# Patient Record
Sex: Male | Born: 1945 | Race: White | Hispanic: No | Marital: Married
Health system: Midwestern US, Academic
[De-identification: ages and names within clinical notes are randomized; demographics above are authoritative.]

## PROBLEM LIST (undated history)

## (undated) DIAGNOSIS — Z9989 Dependence on other enabling machines and devices: Secondary | ICD-10-CM

## (undated) DIAGNOSIS — G629 Polyneuropathy, unspecified: Secondary | ICD-10-CM

## (undated) DIAGNOSIS — M069 Rheumatoid arthritis, unspecified: Secondary | ICD-10-CM

## (undated) DIAGNOSIS — N289 Disorder of kidney and ureter, unspecified: Secondary | ICD-10-CM

## (undated) DIAGNOSIS — K219 Gastro-esophageal reflux disease without esophagitis: Secondary | ICD-10-CM

## (undated) DIAGNOSIS — G4733 Obstructive sleep apnea (adult) (pediatric): Secondary | ICD-10-CM

## (undated) DIAGNOSIS — N189 Chronic kidney disease, unspecified: Secondary | ICD-10-CM

## (undated) DIAGNOSIS — E119 Type 2 diabetes mellitus without complications: Secondary | ICD-10-CM

## (undated) DIAGNOSIS — G4730 Sleep apnea, unspecified: Secondary | ICD-10-CM

## (undated) DIAGNOSIS — E875 Hyperkalemia: Secondary | ICD-10-CM

## (undated) HISTORY — PX: CHOLECYSTECTOMY: SHX55

## (undated) HISTORY — PX: TONSILLECTOMY: SUR1361

## (undated) HISTORY — PX: OTHER SURGICAL HISTORY: SHX169

## (undated) HISTORY — PX: BELOW KNEE LEG AMPUTATION: SUR23

---

## 2005-03-23 NOTE — Unmapped (Signed)
Signed by Lynnell Dike MD on 03/23/2005 at 00:00:00  DXA      Imported By: Maryellen Pile 05/20/2006 09:54:27    _____________________________________________________________________    External Attachment:    Please see [facility] EMR for this document.

## 2010-07-07 NOTE — Unmapped (Signed)
Signed by Melodye Ped MD on 07/10/2010 at 08:46:32    [facility]  ___________________________________________________________________________  Electromyography    Referring Physician:   Carolann Littler, MD  Fax Number:  [phone]  Date of Exam:  07/07/10      NERVE CONDUCTION STUDY (skin temp. 34B)          MOTOR                                AMP.                          LAT.            COND. VEL.       F-WAVE         COMMENTS                                                           (mV)                          (msec)              (m/sec)                (msec)         Stim. L. Median N/R       Stim. L. Ulnar-W 5.8 (>6.5) 3.0 (<3.4)      (>53) 37.8 (<33)                          -BE 4.4  42                         -AE 3.8  42  Stim. R. Median 0.8 (>3.9) 9.5 (<4.5) 39 (>48)   Stim. R. Ulnar-W 9.9 (>6.5) 3.0 (<3.4)      (>53) 36.1 (<33)                          -BE 9.5  47                         -AE 8.8  47                                                           SENSORY                               AMP.                           LAT.           COND. VEL.                                                            (  uV)                           (msec)              (m/sec)    L. Med. Palmar N/R   L. Ulnar Palmar N/R   L. Radial Nerve N/R     (normal values in parentheses)      Indication for Referral: The patient was evaluated for carpal tunnel syndrome and left cervical radiculopathy. He has a long-standing history of type 1 diabetes mellitus.     Summary:   The nerve conduction studies showed an absent left median compound muscle action potential and palmar sensory nerve action potential. The right median motor distal latency was markedly prolonged and the amplitude severely reduced. The left ulnar motor amplitude was low with a conduction block at the elbow by amplitude criteria. All compound muscle action potentials tested had mildly to moderately reduced conduction velocities. All  sensory nerve action potentials tested were absent. The needle exam showed enlarged motor unit potentials with moderate fibrillation in both opponens pollicis muscles as well as the left first dorsal interosseous. Enlarged motor unit potentials without fibrillation were seen in the right first dorsal interosseous, left deltoid and left biceps muscles. The rest of the needle exam was normal. A new, presterilized needle electrode was used and then discarded.     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       Gerome Apley Luellen Pucker, M.D., Diplomate, American Board of Electrodiagnostic Medicine  LAS/md T: 07/09/10    Proofed electronically by Dr. Luellen Pucker         NEEDLE ELECTRODE EXAMINATION         Muscle Insertional   Activity Fibrillation Fasciculation Normal Recruitment Duration Amplitude Turns Comments   L 1st dorsal interos +  ++  +   ???  ++  ++  +     L flx carpi radialis nl 0 0 nl        L triceps nl 0 0 nl        L biceps nl 0 0   +  +      L deltoid nl 0 0   +  +  +     L opponens poll +  ++  0   ++  ++  +     L low cervical psp nl 0 0 - -                    R 1st dorsal interos nl 0 +    ++  +  ++     R opponens poll +  ++  0  ???  ++  ++  ++

## 2012-12-18 NOTE — Unmapped (Signed)
Pt is not our pt as far as i can see

## 2014-01-25 NOTE — Unmapped (Signed)
Pt had b/w done at [facility] on Friday he needs a refill of arava sent to [facility] [phone]

## 2014-01-27 MED ORDER — leflunomide (ARAVA) 20 MG tablet
20 | ORAL_TABLET | Freq: Every day | ORAL | Status: AC
Start: 2014-01-27 — End: 2014-05-05

## 2014-01-27 NOTE — Unmapped (Signed)
done

## 2014-01-27 NOTE — Unmapped (Signed)
RX CALLED IN

## 2014-03-01 MED ORDER — hydroxychloroquine (PLAQUENIL) 200 mg tablet
200 | ORAL_TABLET | Freq: Two times a day (BID) | ORAL | Status: AC
Start: 2014-03-01 — End: 2014-03-08

## 2014-03-08 NOTE — Unmapped (Signed)
Dr. Marcille Blanco,    Can you please refuse this medication, it was already sent in on 03/01/14 to correct pharmacy.    Thank you.

## 2014-03-09 MED ORDER — hydroxychloroquine (PLAQUENIL) 200 mg tablet
200 | ORAL_TABLET | ORAL | Status: AC
Start: 2014-03-09 — End: 2015-07-13

## 2014-03-11 ENCOUNTER — Inpatient Hospital Stay: Admit: 2014-03-11

## 2014-03-11 DIAGNOSIS — Z79899 Other long term (current) drug therapy: Secondary | ICD-10-CM

## 2014-03-11 LAB — CBC
Hematocrit: 39 % (ref 38.5–50.0)
Hemoglobin: 13.1 g/dL (ref 13.2–17.1)
MCH: 30.1 pg (ref 27.0–33.0)
MCHC: 33.6 g/dL (ref 32.0–36.0)
MCV: 89.7 fL (ref 80.0–100.0)
MPV: 7 fL (ref 7.5–11.5)
Platelets: 253 10*3/uL (ref 140–400)
RBC: 4.35 10*6/uL (ref 4.20–5.80)
RDW: 15.1 % (ref 11.0–15.0)
WBC: 8.2 10*3/uL (ref 3.8–10.8)

## 2014-03-11 LAB — HEPATIC FUNCTION PANEL, SERUM
ALT: 15 U/L (ref 7–52)
AST (SGOT): 28 U/L (ref 13–39)
Albumin: 3.9 g/dL (ref 3.5–5.7)
Alkaline Phosphatase: 98 U/L (ref 34–104)
Bilirubin, Direct: 0.12 mg/dL (ref 0.03–0.18)
Bilirubin, Indirect: 0.38 mg/dL (ref 0.20–0.70)
Total Bilirubin: 0.5 mg/dL (ref 0.3–1.2)
Total Protein: 7.2 g/dL (ref 6.4–8.9)

## 2014-03-11 LAB — CREATININE, SERUM
Creatinine: 1.41 mg/dL (ref 0.60–1.30)
GFR MDRD Af Amer: 60 See note.
GFR MDRD Non Af Amer: 50 See note.

## 2014-03-11 LAB — SED RATE: Sed Rate: 45 mm/h — ABNORMAL HIGH (ref 0–20)

## 2014-05-03 ENCOUNTER — Other Ambulatory Visit: Admit: 2014-05-03 | Payer: Medicare (Managed Care)

## 2014-05-03 DIAGNOSIS — Z79899 Other long term (current) drug therapy: Secondary | ICD-10-CM

## 2014-05-03 LAB — SED RATE: Sed Rate: 44 mm/h — ABNORMAL HIGH (ref 0–20)

## 2014-05-03 LAB — CBC
Hematocrit: 41.6 % (ref 38.5–50.0)
Hemoglobin: 13.6 g/dL (ref 13.2–17.1)
MCH: 29.1 pg (ref 27.0–33.0)
MCHC: 32.6 g/dL (ref 32.0–36.0)
MCV: 89.2 fL (ref 80.0–100.0)
MPV: 7.6 fL (ref 7.5–11.5)
Platelets: 206 10E3/uL (ref 140–400)
RBC: 4.66 10E6/uL (ref 4.20–5.80)
RDW: 15.7 % — ABNORMAL HIGH (ref 11.0–15.0)
WBC: 5.9 10E3/uL (ref 3.8–10.8)

## 2014-05-03 LAB — CREATININE, SERUM
Creatinine: 1.53 mg/dL — ABNORMAL HIGH (ref 0.60–1.30)
GFR MDRD Af Amer: 55 See note.
GFR MDRD Non Af Amer: 45 See note.

## 2014-05-03 LAB — HEPATIC FUNCTION PANEL, SERUM
ALT: 15 U/L (ref 7–52)
AST (SGOT): 21 U/L (ref 13–39)
Albumin: 3.9 g/dL (ref 3.5–5.7)
Alkaline Phosphatase: 101 U/L (ref 36–125)
Bilirubin, Direct: 0.16 mg/dL (ref 0.00–0.40)
Bilirubin, Indirect: 0.64 mg/dL (ref 0.00–1.10)
Total Bilirubin: 0.8 mg/dL (ref 0.0–1.5)
Total Protein: 7 g/dL (ref 6.4–8.9)

## 2014-05-05 MED ORDER — leflunomide (ARAVA) 20 MG tablet
20 | ORAL_TABLET | Freq: Every day | ORAL | Status: AC
Start: 2014-05-05 — End: 2014-07-05

## 2014-05-05 NOTE — Unmapped (Signed)
MOST CURRENT LAB( MEDICAL ASSISTANT SHOULD CHECK MEDICAL RECORDS OFFICE, [facility] EP[facility] AND CARE EVERYWHERE FOR LABS AND THEN NOTE WHERE THE LABS ARE LOCATED AND ANY ABNORMALITIES HERE IN THIS MESSAGE):       Specimen on 05/03/2014   Component Date Value Ref Range Status   ??? WBC 05/03/2014 5.9  3.8 - 10.8 10E3/uL Final   ??? RBC 05/03/2014 4.66  4.20 - 5.80 10E6/uL Final   ??? Hemoglobin 05/03/2014 13.6  13.2 - 17.1 g/dL Final   ??? Hematocrit 05/03/2014 41.6  38.5 - 50.0 % Final   ??? MCV 05/03/2014 89.2  80.0 - 100.0 fL Final   ??? MCH 05/03/2014 29.1  27.0 - 33.0 pg Final   ??? MCHC 05/03/2014 32.6  32.0 - 36.0 g/dL Final   ??? RDW 09/81/1914 15.7* 11.0 - 15.0 % Final   ??? Platelets 05/03/2014 206  140 - 400 10E3/uL Final   ??? MPV 05/03/2014 7.6  7.5 - 11.5 fL Final   ??? Total Bilirubin 05/03/2014 0.8  0.0 - 1.5 mg/dL Final    Please note: Effective 03/30/2014, reference range for this assay has changed.   ??? Bilirubin, Direct 05/03/2014 0.16  0.00 - 0.40 mg/dL Final    Please note: Effective 03/30/2014, reference range for this assay has changed.   ??? AST (SGOT) 05/03/2014 21  13 - 39 U/L Final   ??? ALT 05/03/2014 15  7 - 52 U/L Final   ??? Alkaline Phosphatase 05/03/2014 101  36 - 125 U/L Final    Please note: Effective 03/30/2014, reference range for this assay has changed.   ??? Total Protein 05/03/2014 7.0  6.4 - 8.9 g/dL Final   ??? Albumin 78/29/5621 3.9  3.5 - 5.7 g/dL Final   ??? Bilirubin, Indirect 05/03/2014 0.64  0.00 - 1.10 mg/dL Final    Please note: Effective 03/30/2014, reference range for this assay has changed.   ??? Creatinine 05/03/2014 1.53* 0.60 - 1.30 mg/dL Final   ??? GFR MDRD Af Amer 05/03/2014 55   Final    Comment: GFR is estimated using creatinine, age, gender and race. Patient's values should be interpreted as a trend.                               Between 30 and 90 mL/min/1.9m2, clinical correlation is needed.                              For additional information:   [email or web address]                              ??? GFR MDRD  Non Af Amer 05/03/2014 45   Final    Comment: GFR is estimated using creatinine, age, gender and race. Patient's values should be interpreted as a trend.                               Between 30 and 90 mL/min/1.85m2, clinical correlation is needed.                              For additional information:   [email or web address]                              ???  Sed Rate 05/03/2014 44* 0 - 20 mm/hr Final   Hospital Outpatient Visit on 03/11/2014   Component Date Value Ref Range Status   ??? WBC 03/11/2014 8.2  3.8 - 10.8 10E3/uL Final   ??? RBC 03/11/2014 4.35  4.20 - 5.80 10E6/uL Final   ??? Hemoglobin 03/11/2014 13.1* 13.2 - 17.1 g/dL Final   ??? Hematocrit 03/11/2014 39.0  38.5 - 50.0 % Final   ??? MCV 03/11/2014 89.7  80.0 - 100.0 fL Final   ??? MCH 03/11/2014 30.1  27.0 - 33.0 pg Final   ??? MCHC 03/11/2014 33.6  32.0 - 36.0 g/dL Final   ??? RDW 16/08/9603 15.1* 11.0 - 15.0 % Final   ??? Platelets 03/11/2014 253  140 - 400 10E3/uL Final   ??? MPV 03/11/2014 7.0* 7.5 - 11.5 fL Final   ??? Total Bilirubin 03/11/2014 0.5  0.3 - 1.2 mg/dL Final   ??? Bilirubin, Direct 03/11/2014 0.12  0.03 - 0.18 mg/dL Final   ??? AST (SGOT) 03/11/2014 28  13 - 39 U/L Final   ??? ALT 03/11/2014 15  7 - 52 U/L Final   ??? Alkaline Phosphatase 03/11/2014 98  34 - 104 U/L Final   ??? Total Protein 03/11/2014 7.2  6.4 - 8.9 g/dL Final   ??? Albumin 54/07/8118 3.9  3.5 - 5.7 g/dL Final   ??? Bilirubin, Indirect 03/11/2014 0.38  0.20 - 0.70 mg/dL Final   ??? Creatinine 03/11/2014 1.41* 0.60 - 1.30 mg/dL Final   ??? GFR MDRD Af Amer 03/11/2014 60   Final    Comment: GFR is estimated using creatinine, age, gender and race. Patient's values should be interpreted as a trend.                               Between 30 and 90 mL/min/1.105m2, clinical correlation is needed.                              For additional information:   [email or web address]                              ??? GFR MDRD Non Af Amer 03/11/2014 50   Final    Comment: GFR is estimated using creatinine, age, gender and race. Patient's  values should be interpreted as a trend.                               Between 30 and 90 mL/min/1.70m2, clinical correlation is needed.                              For additional information:   [email or web address]                              ??? Sed Rate 03/11/2014 45* 0 - 20 mm/hr Final                 LAST APPT:               ALLERGIES:       No Known Allergies    (MEDICAL ASSISTANT SHOULD CHECK [facility] EPIC AND DOCUMENT IN ALLERGY SECTION  AND MARK AS REVIEWED)

## 2014-05-11 NOTE — Unmapped (Signed)
Progress Notes       Lovell Sheehan., MD 11/11/2013 12:15 PM Signed   RHEUMATOLOGY FOLLOW UP VISIT   11/11/2013   Patient Name: Kenneth Hensley DOB: 21-Oct-1946 MEDICAL RECORD [id number]   PROBLEM LIST      Patient Active Problem List      Diagnosis      ???  Diabetes mellitus      ???  Rheumatoid arthritis        HISTORY OF PRESENT ILLNESS   Kenneth Hensley is a 68 y.o. male who is being seen for follow up evaluation of Rheumatoid arthritis. He's recently been noticing some increasing soreness and stiffness mainly in through his hands. He continues on a rave 20 mg daily. He also takes hydroxychloroquine 2 tabs daily     REVIEW OF SYSTEMS fever, decreased appetite, weight loss, depression, trouble sleeping, eye dryness or irritation, hair loss, mouth dryness, mouth soreness, skin rash, dark or black bowel movements, abdominal discomfort, nausea, swelling of ankles or feet, cough, chest pain, shortness of breath, problems with unusual bleeding or bruising, numbness or tingling, blood in urine     PAST MEDICAL HISTORY   MEDICATIONS      Outpatient Prescriptions Marked as Taking for the 11/11/13 encounter (Office Visit) with Lovell Sheehan., MD      Medication      ???  leflunomide (ARAVA) 20 mg tablet      ???  folic acid (FOLVITE) 1 mg tablet      ???  hydroxychloroquine (PLAQUENIL) 200 mg tablet        ALLERGIES   No Known Allergies     PHYSICAL EXAM   Physical Exam:   Blood pressure 88/48, height 5' 9 (1.753 m), weight 205 lb (92.987 kg). Body mass index is 30.26 kg/(m^2).   Musculoskeletal: Ambulates without assistance, normal gait, No definite swelling hands wrists or elbows     LABS AND IMAGING      Lab Results      Component  Value  Date       WBC  7.2  09/25/2013       HGB  14.6  09/25/2013       HCT  42.7  09/25/2013       MCV  89.3  09/25/2013       PLT  222  09/25/2013           Lab Results      Component  Value  Date       CREATININE  1.36*  09/25/2013           Lab Results      Component  Value  Date       BILITOT  0.9   09/25/2013       BILIDIR  0.4  09/25/2013       AST  35  09/25/2013       ALT  26  09/25/2013       ALKPHOS  130  09/25/2013       ALB  4.0  09/25/2013           Lab Results      Component  Value  Date       RF  201*  10/15/2012       SEDRATE  24*  09/25/2013            ASSESSMENT AND PLAN   1. History suggests that rheumatoid arthritis is active but I cannot detect this.  We discussed therapeutic options. He could be attempt sulfasalazine. However, currently feels his symptoms are minimal and will not require this   1. The patient indicates understanding of these issues and agrees with the plan.   2. I reviewed the patient's medical information and medical history.   3. I have reviewed the past medical, family, and social history sections including the medications and allergies listed in the above medical record.   Electronically signed by: Carlisle Beers. Marcille Blanco, MD, MD, 11/11/2013 12:13 PM

## 2014-05-12 NOTE — Unmapped (Signed)
RHEUMATOLOGY FOLLOW UP VISIT    05/12/2014  Patient Name: EBRIMA RANTA  DOB: [birth year]  MEDICAL RECORD [id number]    PROBLEM LIST     NOTES      HISTORY OF PRESENT ILLNESS  ZAKARIYAH FREIMARK is a 68 y.o. 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 He continues on Arava 20 mg. daily.  He has no interval complaints.  He has transient stiffness in his wrist and hands.  This has not progressed and he has not developed functional problems      REVIEW OF SYSTEMS fever, decreased appetite, weight loss, depression, trouble sleeping, eye dryness or irritation, hair loss, mouth dryness, mouth soreness, skin rash, dark or black bowel movements, abdominal discomfort, nausea, swelling of ankles or feet, cough, chest pain, shortness of breath, problems with unusual bleeding or bruising, numbness or tingling, blood in urine  negative      PAST MEDICAL HISTORY    Cyst over plantar right foot apparently spontaneously opening now feeling supported with a cast    MEDICATIONS  @ACTMED2 @       ALLERGIES  No Known Allergies       PHYSICAL EXAM   Physical Exam:  There were no vitals taken for this visit. There is no height or weight on file to calculate BMI.  General: alert , in no acute distress.   Skin: No rash.  HENT:  Parotid glands are not tender or enlarged.  Temporal arteries are nontender with palpable pulses.    Lungs: Clear to auscultation, no wheezes or rhonchi.   Neuromuscular: full proximal upper and lower extremity strength  Musculoskeletal:  Ambulates without assistance, normal gait, Heberden's and Bouchard's nodes.  No swelling or tenderness MCPs, wrists,  Or elbows    LABS AND IMAGING   Lab Results   Component Value Date    WBC 5.9 05/03/2014    HGB 13.6 05/03/2014    HCT 41.6 05/03/2014    MCV 89.2 05/03/2014    PLT 206 05/03/2014     Lab Results   Component Value Date    CREATININE 1.53* 05/03/2014     Lab Results   Component Value Date    BILITOT 0.8 05/03/2014    ALT 15 05/03/2014    ALKPHOS 101 05/03/2014      No results found for this basename: ANA, RF, SEDRATE             Assessment and plan  1. Rheumatoid arthritis well-controlled  2. Continue same treatment and return in 6 months      1.  The patient indicates understanding of these issues and agrees with the plan.  2.  I reviewed the patient's medical information and medical history.   3.  I have reviewed the past medical, family, and social history sections including the medications and allergies listed in the above medical record.     Electronically signed by: Lovell Sheehan, MD, 05/12/2014 12:30 PM    *This note was created using voice recognition software, there may be sound alike errors.*

## 2014-05-13 ENCOUNTER — Ambulatory Visit: Admit: 2014-05-13 | Discharge: 2014-05-13 | Payer: Medicare (Managed Care)

## 2014-05-13 DIAGNOSIS — M059 Rheumatoid arthritis with rheumatoid factor, unspecified: Secondary | ICD-10-CM

## 2014-07-01 ENCOUNTER — Ambulatory Visit: Admit: 2014-07-01 | Discharge: 2014-07-01 | Payer: Medicare (Managed Care)

## 2014-07-01 DIAGNOSIS — Z79899 Other long term (current) drug therapy: Secondary | ICD-10-CM

## 2014-07-01 LAB — CBC
Hematocrit: 42.9 % (ref 38.5–50.0)
Hemoglobin: 14.6 g/dL (ref 13.2–17.1)
MCH: 30.5 pg (ref 27.0–33.0)
MCHC: 34.1 g/dL (ref 32.0–36.0)
MCV: 89.5 fL (ref 80.0–100.0)
MPV: 7.3 fL (ref 7.5–11.5)
Platelets: 240 10*3/uL (ref 140–400)
RBC: 4.8 10*6/uL (ref 4.20–5.80)
RDW: 14.8 % (ref 11.0–15.0)
WBC: 7.1 10*3/uL (ref 3.8–10.8)

## 2014-07-01 LAB — CREATININE, SERUM
Creatinine: 1.47 mg/dL (ref 0.60–1.30)
GFR MDRD Af Amer: 57 See note.
GFR MDRD Non Af Amer: 47 See note.

## 2014-07-01 LAB — HEPATIC FUNCTION PANEL, SERUM
ALT: 17 U/L (ref 7–52)
AST (SGOT): 28 U/L (ref 13–39)
Albumin: 3.9 g/dL (ref 3.5–5.7)
Alkaline Phosphatase: 123 U/L (ref 36–125)
Bilirubin, Direct: 0.18 mg/dL (ref 0.00–0.40)
Bilirubin, Indirect: 0.62 mg/dL (ref 0.00–1.10)
Total Bilirubin: 0.8 mg/dL (ref 0.0–1.5)
Total Protein: 7.1 g/dL (ref 6.4–8.9)

## 2014-07-01 LAB — SED RATE: Sed Rate: 26 mm/hr (ref 0–20)

## 2014-07-05 NOTE — Unmapped (Signed)
MOST CURRENT LAB( MEDICAL ASSISTANT SHOULD CHECK MEDICAL RECORDS OFFICE, [facility] AND CARE EVERYWHERE FOR LABS AND THEN NOTE WHERE THE LABS ARE LOCATED AND ANY ABNORMALITIES HERE IN THIS MESSAGE):       Appointment on 07/01/2014   Component Date Value Ref Range Status   ??? WBC 07/01/2014 7.1  3.8 - 10.8 10E3/uL Final   ??? RBC 07/01/2014 4.80  4.20 - 5.80 10E6/uL Final   ??? Hemoglobin 07/01/2014 14.6  13.2 - 17.1 g/dL Final   ??? Hematocrit 07/01/2014 42.9  38.5 - 50.0 % Final   ??? MCV 07/01/2014 89.5  80.0 - 100.0 fL Final   ??? MCH 07/01/2014 30.5  27.0 - 33.0 pg Final   ??? MCHC 07/01/2014 34.1  32.0 - 36.0 g/dL Final   ??? RDW 16/08/9603 14.8  11.0 - 15.0 % Final   ??? Platelets 07/01/2014 240  140 - 400 10E3/uL Final   ??? MPV 07/01/2014 7.3* 7.5 - 11.5 fL Final   ??? Total Bilirubin 07/01/2014 0.8  0.0 - 1.5 mg/dL Final   ??? Bilirubin, Direct 07/01/2014 0.18  0.00 - 0.40 mg/dL Final   ??? AST (SGOT) 07/01/2014 28  13 - 39 U/L Final   ??? ALT 07/01/2014 17  7 - 52 U/L Final   ??? Alkaline Phosphatase 07/01/2014 123  36 - 125 U/L Final   ??? Total Protein 07/01/2014 7.1  6.4 - 8.9 g/dL Final   ??? Albumin 54/07/8118 3.9  3.5 - 5.7 g/dL Final   ??? Bilirubin, Indirect 07/01/2014 0.62  0.00 - 1.10 mg/dL Final   ??? Creatinine 07/01/2014 1.47* 0.60 - 1.30 mg/dL Final   ??? GFR MDRD Af Amer 07/01/2014 57   Final    Comment: GFR is estimated using creatinine, age, gender and race. Patient's values should be interpreted as a trend.                               Between 30 and 90 mL/min/1.33m2, clinical correlation is needed.                              For additional information:   [email or web address]                              ??? GFR MDRD Non Af Amer 07/01/2014 47   Final    Comment: GFR is estimated using creatinine, age, gender and race. Patient's values should be interpreted as a trend.                               Between 30 and 90 mL/min/1.1m2, clinical correlation is needed.                              For additional information:    [email or web address]                              ??? Sed Rate 07/01/2014 26* 0 - 20 mm/hr Final                 LAST APPT:               ALLERGIES:  No Known Allergies    (MEDICAL ASSISTANT SHOULD CHECK [facility] EPIC AND DOCUMENT IN ALLERGY SECTION                                            AND MARK AS REVIEWED)

## 2014-07-06 MED ORDER — leflunomide (ARAVA) 20 MG tablet
20 | ORAL_TABLET | ORAL | Status: AC
Start: 2014-07-06 — End: 2014-08-31

## 2014-07-07 NOTE — Unmapped (Signed)
Check to see if he has taken sulfasalazine before

## 2014-07-07 NOTE — Unmapped (Signed)
pts pharm called and said that his arava is on backorder until September. What should he do?

## 2014-07-08 NOTE — Unmapped (Signed)
LEFT MESSSAGE FOR PT TO CALL

## 2014-07-08 NOTE — Unmapped (Addendum)
Patient returned call and he has taken sulfasalazine he believes in 2011.   He doesn't remember having any problems with it.     Patient doesn't need because [facility] was able to fill the ARAVA.   Patient will stay on the [facility] for now.   OM

## 2014-07-08 NOTE — Unmapped (Signed)
OK 

## 2014-08-30 ENCOUNTER — Ambulatory Visit: Admit: 2014-08-30 | Discharge: 2014-08-30 | Payer: Medicare (Managed Care)

## 2014-08-30 DIAGNOSIS — Z79899 Other long term (current) drug therapy: Secondary | ICD-10-CM

## 2014-08-30 LAB — CBC
Hematocrit: 41.5 % (ref 38.5–50.0)
Hemoglobin: 13.6 g/dL (ref 13.2–17.1)
MCH: 30 pg (ref 27.0–33.0)
MCHC: 32.7 g/dL (ref 32.0–36.0)
MCV: 91.7 fL (ref 80.0–100.0)
MPV: 7.7 fL (ref 7.5–11.5)
Platelets: 178 10*3/uL (ref 140–400)
RBC: 4.52 10*6/uL (ref 4.20–5.80)
RDW: 15.4 % — ABNORMAL HIGH (ref 11.0–15.0)
WBC: 7.2 10*3/uL (ref 3.8–10.8)

## 2014-08-30 LAB — HEPATIC FUNCTION PANEL, SERUM
ALT: 12 U/L (ref 7–52)
AST (SGOT): 19 U/L (ref 13–39)
Albumin: 3.8 g/dL (ref 3.5–5.7)
Alkaline Phosphatase: 89 U/L (ref 36–125)
Bilirubin, Direct: 0.19 mg/dL (ref 0.00–0.40)
Bilirubin, Indirect: 0.61 mg/dL (ref 0.00–1.10)
Total Bilirubin: 0.8 mg/dL (ref 0.0–1.5)
Total Protein: 6.4 g/dL (ref 6.4–8.9)

## 2014-08-30 LAB — SED RATE: Sed Rate: 26 mm/hr (ref 0–20)

## 2014-08-30 LAB — CREATININE, SERUM
Creatinine: 1.56 mg/dL — ABNORMAL HIGH (ref 0.60–1.30)
GFR MDRD Af Amer: 54 See note.
GFR MDRD Non Af Amer: 44 See note.

## 2014-08-31 NOTE — Unmapped (Signed)
MOST CURRENT LAB( MEDICAL ASSISTANT SHOULD CHECK MEDICAL RECORDS OFFICE, [facility] AND CARE EVERYWHERE FOR LABS AND THEN NOTE WHERE THE LABS ARE LOCATED AND ANY ABNORMALITIES HERE IN THIS MESSAGE):       Appointment on 08/30/2014   Component Date Value Ref Range Status   ??? WBC 08/30/2014 7.2  3.8 - 10.8 10E3/uL Final   ??? RBC 08/30/2014 4.52  4.20 - 5.80 10E6/uL Final   ??? Hemoglobin 08/30/2014 13.6  13.2 - 17.1 g/dL Final   ??? Hematocrit 08/30/2014 41.5  38.5 - 50.0 % Final   ??? MCV 08/30/2014 91.7  80.0 - 100.0 fL Final   ??? MCH 08/30/2014 30.0  27.0 - 33.0 pg Final   ??? MCHC 08/30/2014 32.7  32.0 - 36.0 g/dL Final   ??? RDW 16/08/9603 15.4* 11.0 - 15.0 % Final   ??? Platelets 08/30/2014 178  140 - 400 10E3/uL Final   ??? MPV 08/30/2014 7.7  7.5 - 11.5 fL Final   ??? Total Bilirubin 08/30/2014 0.8  0.0 - 1.5 mg/dL Final   ??? Bilirubin, Direct 08/30/2014 0.19  0.00 - 0.40 mg/dL Final   ??? AST (SGOT) 08/30/2014 19  13 - 39 U/L Final   ??? ALT 08/30/2014 12  7 - 52 U/L Final   ??? Alkaline Phosphatase 08/30/2014 89  36 - 125 U/L Final   ??? Total Protein 08/30/2014 6.4  6.4 - 8.9 g/dL Final   ??? Albumin 54/07/8118 3.8  3.5 - 5.7 g/dL Final   ??? Bilirubin, Indirect 08/30/2014 0.61  0.00 - 1.10 mg/dL Final   ??? Creatinine 08/30/2014 1.56* 0.60 - 1.30 mg/dL Final   ??? GFR MDRD Af Amer 08/30/2014 54   Final    Comment: GFR is estimated using creatinine, age, gender and race. Patient's values should be interpreted as a trend.                               Between 30 and 90 mL/min/1.57m2, clinical correlation is needed.                              For additional information:   [email or web address]                              ??? GFR MDRD Non Af Amer 08/30/2014 44   Final    Comment: GFR is estimated using creatinine, age, gender and race. Patient's values should be interpreted as a trend.                               Between 30 and 90 mL/min/1.9m2, clinical correlation is needed.                              For additional information:    [email or web address]                              ??? Sed Rate 08/30/2014 26* 0 - 20 mm/hr Final                 LAST APPT:               ALLERGIES:  No Known Allergies    (MEDICAL ASSISTANT SHOULD CHECK [facility] EPIC AND DOCUMENT IN ALLERGY SECTION                                            AND MARK AS REVIEWED)

## 2014-09-01 MED ORDER — leflunomide (ARAVA) 20 MG tablet
20 | ORAL_TABLET | ORAL | Status: AC
Start: 2014-09-01 — End: 2014-11-03

## 2014-09-14 MED ORDER — folic acid (FOLVITE) 1 MG tablet
1 | ORAL_TABLET | Freq: Every day | ORAL | 2.00 refills | 30.00000 days | Status: AC
Start: 2014-09-14 — End: 2014-11-17

## 2014-09-14 NOTE — Unmapped (Signed)
LAST APPT:  05/13/14  IF NO APPT PULLS THROUGH MEDICAL ASSISTANT SHOULD CHECK   [facility] EPIC AND NOTE HERE:           ALLERGIES:   No Known Allergies  IF NO ALLERGIES PULL THROUGH MEDICAL ASSISTANT SHOULD   CHECK [facility] EPIC AND DOCUMENT IN ALLERGY SECTION AND MARK  AS REVIEWED

## 2014-11-03 ENCOUNTER — Other Ambulatory Visit: Admit: 2014-11-03 | Payer: Medicare (Managed Care)

## 2014-11-03 DIAGNOSIS — Z79899 Other long term (current) drug therapy: Secondary | ICD-10-CM

## 2014-11-03 LAB — HEPATIC FUNCTION PANEL, SERUM
ALT: 19 U/L (ref 7–52)
AST (SGOT): 26 U/L (ref 13–39)
Albumin: 3.9 g/dL (ref 3.5–5.7)
Alkaline Phosphatase: 107 U/L (ref 36–125)
Bilirubin, Direct: 0.17 mg/dL (ref 0.00–0.40)
Bilirubin, Indirect: 0.43 mg/dL (ref 0.00–1.10)
Total Bilirubin: 0.6 mg/dL (ref 0.0–1.5)
Total Protein: 7 g/dL (ref 6.4–8.9)

## 2014-11-03 LAB — CBC
Hematocrit: 40.8 % (ref 38.5–50.0)
Hemoglobin: 13.5 g/dL (ref 13.2–17.1)
MCH: 30.3 pg (ref 27.0–33.0)
MCHC: 33.1 g/dL (ref 32.0–36.0)
MCV: 91.6 fL (ref 80.0–100.0)
MPV: 7.5 fL (ref 7.5–11.5)
Platelets: 172 10*3/uL (ref 140–400)
RBC: 4.46 10*6/uL (ref 4.20–5.80)
RDW: 15.2 % — ABNORMAL HIGH (ref 11.0–15.0)
WBC: 5.9 10*3/uL (ref 3.8–10.8)

## 2014-11-03 LAB — CREATININE, SERUM
Creatinine: 1.45 mg/dL — ABNORMAL HIGH (ref 0.60–1.30)
GFR MDRD Af Amer: 58 See note.
GFR MDRD Non Af Amer: 48 See note.

## 2014-11-03 LAB — SED RATE: Sed Rate: 28 mm/h — ABNORMAL HIGH (ref 0–20)

## 2014-11-03 MED ORDER — leflunomide (ARAVA) 20 MG tablet
20 | ORAL_TABLET | Freq: Every day | ORAL | Status: AC
Start: 2014-11-03 — End: 2015-01-05

## 2014-11-03 NOTE — Unmapped (Signed)
MOST CURRENT LAB( MEDICAL ASSISTANT SHOULD CHECK MEDICAL RECORDS OFFICE, [facility] AND CARE EVERYWHERE FOR LABS AND THEN NOTE WHERE THE LABS ARE LOCATED AND ANY ABNORMALITIES HERE IN THIS MESSAGE):       Labs in process from today            LAST APPT:     05/13/14          ALLERGIES:       No Known Allergies    (MEDICAL ASSISTANT SHOULD CHECK [facility] EPIC AND DOCUMENT IN ALLERGY SECTION                                            AND MARK AS REVIEWED)

## 2014-11-11 NOTE — Unmapped (Signed)
RHEUMATOLOGY TRANSFER PATIENT VISIT    11/17/2014    CHIEF COMPLAINT  Chief Complaint   Patient presents with   ??? Follow-up       HISTORY OF PRESENT ILLNESS  Kenneth Hensley is a 68 y.o. male who is transferring care from [employer]. 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 He's tried several DMARDs currently on Arava 20mg  qD and HCQ. Was doing well last visit despite somewhat elevated SED. Today he notes that he has noticed some increasing AM stiffness and he is unable to button his shirts as easily as he used to when he was taking MTX.    REVIEW OF SYSTEMS  eye dryness    Rest of ROS reviewed and are negative       PAST Medical History    Past Medical History   Diagnosis Date   ??? Unspecified inflammatory polyarthropathy    ??? Rheumatoid arthritis(714.0)        MEDICATIONS  Current Outpatient Prescriptions   Medication Sig Dispense Refill   ??? aspirin 81 MG EC tablet Take 81 mg by mouth daily.     ??? fish oil-omega-3 fatty acids 300-1,000 mg capsule Take 2 g by mouth daily.     ??? folic acid (FOLVITE) 1 MG tablet Take 1 tablet (1 mg total) by mouth daily. TAKE ONE TABLET BY MOUTH EVERY DAY 30 tablet 5   ??? hydroxychloroquine (PLAQUENIL) 200 mg tablet TAKE ONE TABLET BY MOUTH TWICE A DAY 60 tablet 4   ??? insulin aspart protamine-insulin aspart (NOVOLOG 70/30) 100 unit/mL (70-30) flexpen Inject subcutaneously 2 times a day with meals.     ??? leflunomide (ARAVA) 20 MG tablet Take 1 tablet (20 mg total) by mouth daily. 30 tablet 1   ??? multivitamin (MULTIVITAMIN) tablet Take 1 tablet by mouth daily.     ??? NOVOLOG 100 unit/mL injection      ??? omeprazole (PRILOSEC) 20 MG capsule Take 20 mg by mouth every morning before breakfast.     ??? RESTASIS 0.05 % ophthalmic emulsion        No current facility-administered medications for this visit.         PHYSICAL EXAM   Filed Vitals:    11/17/14 1001   BP: 117/63   Pulse: 86   Height: 5' 9 (1.753 m)   Weight: 208 lb (94.348 kg)     Constitutional:  Well developed, well nourished, no acute  distress  Eyes:  PERRL, extra ocular movements intact, conjunctiva normal   Musculoskeletal: No synovitis, multiple heberden's nodes.  Integument:  Well hydrated, no rash or telangiectasias  Lymphatic:  No lymphadenopathy noted   Neurologic:   Alert & oriented x 3. Sensations Intact. Muscle strength 5/5 proximally and distally in upper and lower extremities.   Psychiatric:  Speech and behavior appropriate       LABS  Lab Results   Component Value Date    WBC 5.9 11/03/2014    HGB 13.5 11/03/2014    HCT 40.8 11/03/2014    MCV 91.6 11/03/2014    PLT 172 11/03/2014      Lab Results   Component Value Date    CREATININE 1.45* 11/03/2014      Lab Results   Component Value Date    ALT 19 11/03/2014    ALKPHOS 107 11/03/2014    BILITOT 0.6 11/03/2014      No results found for: VITD25H   No results found for: CRP   Lab Results  Component Value Date    ESR 28* 11/03/2014      RF 201  CCP >250    IMAGING  Hands (09/2012)  Right:  1. Severe joint space narrowing and erosions involving the first MCP joint and fourth and fifth PIP joints. Differential diagnosis includes psoriatic arthritis, reactive arthritis, rheumatoid arthritis, or erosive osteoarthritis. Psoriatic arthritis is favored over rheumatoid arthritis due to the symmetry with the right hand.  2. Multiple erosions are also seen in the carpus, some of which demonstrate well-defined sclerotic margins. This appearance can be seen with amyloidosis. There is diffuse small vessel atherosclerosis which can be seen with chronic renal insufficiency which predisposes to amyloidosis.    Left:  1. Erosive arthritis primarily involving the 1st MCP and 3rd PIP joint. Asymmetry with the left hand is atypical of rheumatoid arthritis. Differential diagnosis includes psoriatic arthritis, rheumatoid arthritis, or reactive arthritis.  2. Small erosions are seen in the carpus. Findings could be related to erosive arthropathy with amyloidosis not completely  excluded.      Assessment  Assessment and plan  Kenneth Hensley is a 68 y.o. 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 Diffuse, asymmetric erosions noted on plain films 2013. Previously had excellent control on MTX, but weened off after onset of CKD. CKD has been stable for some time. He is more symptomatic since stopping MTX and last ESR elevated. Will restart MTX (check safety labs monthly for first 2 months). Will try to ween Arava if improved on MTX.  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     CKD III:     Therapeutic drug monitoring:   - safety labs and APRs q47mo  - Optho yearly     Lanice Schwab M.D.  [facility]  [address], [address]  [place], [place]  [address]  [phone]

## 2014-11-17 ENCOUNTER — Inpatient Hospital Stay: Payer: Medicare (Managed Care) | Attending: Rheumatology

## 2014-11-17 ENCOUNTER — Encounter: Admit: 2014-11-17 | Discharge: 2014-11-17 | Payer: Medicare (Managed Care) | Attending: Rheumatology

## 2014-11-17 DIAGNOSIS — M069 Rheumatoid arthritis, unspecified: Secondary | ICD-10-CM

## 2014-11-17 MED ORDER — folic acid (FOLVITE) 1 MG tablet
1 | ORAL_TABLET | Freq: Every day | ORAL | 2.00 refills | 30.00000 days | Status: AC
Start: 2014-11-17 — End: 2015-05-04

## 2014-11-17 MED ORDER — methotrexate 2.5 MG tablet
2.5 | ORAL_TABLET | ORAL | 0.00 refills | 28.00000 days | Status: AC
Start: 2014-11-17 — End: 2015-01-19

## 2014-11-17 NOTE — Unmapped (Signed)
Insurance forms have been faxed to [employer]

## 2014-11-17 NOTE — Unmapped (Signed)
Date created 11/17/14  Drug MTX 2.5mg   Insurance [employer]

## 2014-11-22 NOTE — Unmapped (Addendum)
RHEUMATOLOGY TRANSFER PATIENT VISIT    11/22/2014    CHIEF COMPLAINT  No chief complaint on file.      HISTORY OF PRESENT ILLNESS  Kenneth Hensley is a 68 y.o. male who is transferring care from [employer]. 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 He's tried several DMARDs currently on Arava 20mg  qD and HCQ. Was doing well last visit despite somewhat elevated SED. Today he notes that he has noticed some increasing AM stiffness and he is unable to button his shirts as easily as he used to when he was taking MTX.    REVIEW OF SYSTEMS  eye dryness    Rest of ROS reviewed and are negative       PAST Medical History    Past Medical History   Diagnosis Date   ??? Unspecified inflammatory polyarthropathy    ??? Rheumatoid arthritis(714.0)        MEDICATIONS  Current Outpatient Prescriptions   Medication Sig Dispense Refill   ??? aspirin 81 MG EC tablet Take 81 mg by mouth daily.     ??? fish oil-omega-3 fatty acids 300-1,000 mg capsule Take 2 g by mouth daily.     ??? folic acid (FOLVITE) 1 MG tablet Take 1 tablet (1 mg total) by mouth daily. TAKE ONE TABLET BY MOUTH EVERY DAY 30 tablet 5   ??? hydroxychloroquine (PLAQUENIL) 200 mg tablet TAKE ONE TABLET BY MOUTH TWICE A DAY 60 tablet 4   ??? insulin aspart protamine-insulin aspart (NOVOLOG 70/30) 100 unit/mL (70-30) flexpen Inject subcutaneously 2 times a day with meals.     ??? leflunomide (ARAVA) 20 MG tablet Take 1 tablet (20 mg total) by mouth daily. 30 tablet 1   ??? methotrexate 2.5 MG tablet Take 5 tablets (12.5 mg total) by mouth Every Sunday. Indications: Rheumatoid Arthritis 20 tablet 1   ??? multivitamin (MULTIVITAMIN) tablet Take 1 tablet by mouth daily.     ??? NOVOLOG 100 unit/mL injection      ??? omeprazole (PRILOSEC) 20 MG capsule Take 20 mg by mouth every morning before breakfast.     ??? RESTASIS 0.05 % ophthalmic emulsion        No current facility-administered medications for this visit.         PHYSICAL EXAM   There were no vitals filed for this visit.  Constitutional:  Well  developed, well nourished, no acute distress  Eyes:  PERRL, extra ocular movements intact, conjunctiva normal   Musculoskeletal: No synovitis, multiple heberden's nodes.  Integument:  Well hydrated, no rash or telangiectasias  Lymphatic:  No lymphadenopathy noted   Neurologic:   Alert & oriented x 3. Sensations Intact. Muscle strength 5/5 proximally and distally in upper and lower extremities.   Psychiatric:  Speech and behavior appropriate       LABS  Lab Results   Component Value Date    WBC 5.9 11/03/2014    HGB 13.5 11/03/2014    HCT 40.8 11/03/2014    MCV 91.6 11/03/2014    PLT 172 11/03/2014      Lab Results   Component Value Date    CREATININE 1.45* 11/03/2014      Lab Results   Component Value Date    ALT 19 11/03/2014    ALKPHOS 107 11/03/2014    BILITOT 0.6 11/03/2014      No results found for: VITD25H   No results found for: CRP   Lab Results   Component Value Date    ESR 28*  11/03/2014      RF 201  CCP >250    IMAGING  Hands (09/2012)  Right:  1. Severe joint space narrowing and erosions involving the first MCP joint and fourth and fifth PIP joints. Differential diagnosis includes psoriatic arthritis, reactive arthritis, rheumatoid arthritis, or erosive osteoarthritis. Psoriatic arthritis is favored over rheumatoid arthritis due to the symmetry with the right hand.  2. Multiple erosions are also seen in the carpus, some of which demonstrate well-defined sclerotic margins. This appearance can be seen with amyloidosis. There is diffuse small vessel atherosclerosis which can be seen with chronic renal insufficiency which predisposes to amyloidosis.    Left:  1. Erosive arthritis primarily involving the 1st MCP and 3rd PIP joint. Asymmetry with the left hand is atypical of rheumatoid arthritis. Differential diagnosis includes psoriatic arthritis, rheumatoid arthritis, or reactive arthritis.  2. Small erosions are seen in the carpus. Findings could be related to erosive arthropathy with amyloidosis not  completely excluded.      Assessment  Assessment and plan  Kenneth Hensley is a 68 y.o. 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 Diffuse, asymmetric erosions noted on plain films 2013. Previously had excellent control on MTX, but weened off after onset of CKD. CKD has been stable for some time. He is more symptomatic since stopping MTX and last ESR elevated. Will restart MTX (check safety labs monthly for first 2 months). Will try to ween Arava if improved on MTX.  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     CKD III:     Therapeutic drug monitoring:   - safety labs and APRs q40mo  - Optho yearly     Lanice Schwab M.D.  [facility]  [address], [address]  [place], [place]  [address]  [phone]

## 2014-11-22 NOTE — Unmapped (Signed)
Prior Authorization has been APPROVED for METHOTREXATE TABLETS through [employer]  Authorization is valid 10/19/14 to 11/17/17  Authorization number [id number]  Pharmacy has been informed

## 2014-11-29 ENCOUNTER — Inpatient Hospital Stay: Admit: 2014-11-29 | Payer: Medicare (Managed Care) | Attending: Rheumatology

## 2014-11-29 DIAGNOSIS — M06841 Other specified rheumatoid arthritis, right hand: Secondary | ICD-10-CM

## 2015-01-05 MED ORDER — leflunomide (ARAVA) 20 MG tablet
20 | ORAL_TABLET | Freq: Every day | ORAL | Status: AC
Start: 2015-01-05 — End: 2015-03-09

## 2015-01-05 NOTE — Unmapped (Signed)
LAST APPT:    11/17/14  Last labs 12/14/15in care everywhere or 11/03/14 in [facility]    (if last appointment was a no show or cancellation on same day please get the following information)    Script: You had an appointment on_____ and you didn't attend that appointment. I need to let the physicians know the reason when I send this request. Thank you.     NO SHOW/SAME DAY CANCEL REASON:        ALLERGIES:   No Known Allergies

## 2015-01-14 NOTE — Unmapped (Signed)
RHEUMATOLOGY FOLLOW UP PATIENT VISIT    01/19/2015    CHIEF COMPLAINT  Chief Complaint   Patient presents with   ??? Joint Pain       HISTORY OF PRESENT ILLNESS  Kenneth Hensley is a 69 y.o. 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    Interim history:  Restarted MTX last visit. Continued other DMARDs. Still with ~1hr AM stiffness since restarting MTX however he is feeling better in general with less joint pain. Repeat plain films show mild increase in erosions. Still c/o dry eyes unchanged since restarting MTX.    REVIEW OF SYSTEMS  eye dryness    Rest of ROS reviewed and are negative       PAST Medical History    Past Medical History   Diagnosis Date   ??? Unspecified inflammatory polyarthropathy    ??? Rheumatoid arthritis(714.0)        MEDICATIONS  Current Outpatient Prescriptions   Medication Sig Dispense Refill   ??? aspirin 81 MG EC tablet Take 81 mg by mouth daily.     ??? fish oil-omega-3 fatty acids 300-1,000 mg capsule Take 2 g by mouth daily.     ??? folic acid (FOLVITE) 1 MG tablet Take 1 tablet (1 mg total) by mouth daily. TAKE ONE TABLET BY MOUTH EVERY DAY 30 tablet 5   ??? hydroxychloroquine (PLAQUENIL) 200 mg tablet TAKE ONE TABLET BY MOUTH TWICE A DAY 60 tablet 4   ??? leflunomide (ARAVA) 20 MG tablet Take 1 tablet (20 mg total) by mouth daily. 30 tablet 1   ??? methotrexate 2.5 MG tablet Take 5 tablets (12.5 mg total) by mouth Every Sunday. Indications: Rheumatoid Arthritis 20 tablet 1   ??? multivitamin (MULTIVITAMIN) tablet Take 1 tablet by mouth daily.     ??? NOVOLOG 100 unit/mL injection      ??? omeprazole (PRILOSEC) 20 MG capsule Take 20 mg by mouth every morning before breakfast.     ??? RESTASIS 0.05 % ophthalmic emulsion        No current facility-administered medications for this visit.         PHYSICAL EXAM   Filed Vitals:    01/19/15 1051   BP: 101/61   Height: 5' 9 (1.753 m)   Weight: 208 lb (94.348 kg)     Constitutional:  Well developed, well nourished, no acute distress  Eyes:  PERRL, extra ocular movements intact,  conjunctiva normal   Musculoskeletal: No synovitis, multiple heberden's nodes.  Integument:  Well hydrated, no rash or telangiectasias  Lymphatic:  No lymphadenopathy noted   Neurologic:   Alert & oriented x 3. Sensations Intact. Muscle strength 5/5 proximally and distally in upper and lower extremities.   Psychiatric:  Speech and behavior appropriate       LABS  Lab Results   Component Value Date    WBC 5.9 11/03/2014    HGB 13.5 11/03/2014    HCT 40.8 11/03/2014    MCV 91.6 11/03/2014    PLT 172 11/03/2014      Lab Results   Component Value Date    CREATININE 1.45* 11/03/2014      Lab Results   Component Value Date    ALT 19 11/03/2014    ALKPHOS 107 11/03/2014    BILITOT 0.6 11/03/2014      No results found for: VITD25H   No results found for: CRP   Lab Results   Component Value Date    ESR 28* 11/03/2014  RF 201  CCP >250    IMAGING  Hands (09/2012)  Right:  1. Severe joint space narrowing and erosions involving the first MCP joint and fourth and fifth PIP joints. Differential diagnosis includes psoriatic arthritis, reactive arthritis, rheumatoid arthritis, or erosive osteoarthritis. Psoriatic arthritis is favored over rheumatoid arthritis due to the symmetry with the right hand.  2. Multiple erosions are also seen in the carpus, some of which demonstrate well-defined sclerotic margins. This appearance can be seen with amyloidosis. There is diffuse small vessel atherosclerosis which can be seen with chronic renal insufficiency which predisposes to amyloidosis.    Left:  1. Erosive arthritis primarily involving the 1st MCP and 3rd PIP joint. Asymmetry with the left hand is atypical of rheumatoid arthritis. Differential diagnosis includes psoriatic arthritis, rheumatoid arthritis, or reactive arthritis.  2. Small erosions are seen in the carpus. Findings could be related to erosive arthropathy with amyloidosis not completely excluded.    Hands (11/2014)  There is slight irregularity of the ulnar styloid tip  with moderate overlying soft tissue swelling. There are marked erosive changes and advanced joint space narrowing at the 1st MCP joint. There is small erosion at the 1st IP and possible tiny erosions in the 2nd and 3rd MCP joints. There is marked erosive changes at the 2nd PIP joint with advanced joint space narrowing. Minimal soft tissue swelling overlying the dorsal aspect of the MCP joint.There is mild narrowing of the third through fifth DIP joints with mild proliferative changes which may relate to mild osteoarthrosis. Vascular calcifications.  ??  Left hand: ??  Mild soft tissue swelling is present along the distal ulna. There are multifocal lucencies of the carpal bones more so at capitate, hamate, scaphotrapezial joint which are likely erosive changes. There is narrowing of the capitate hamate joint.  ??      There are erosions at the 1st MCP joint with mild joint space narrowing. There is marked destructive erosive changes with advanced narrowing at the 2nd MCP joint. Minimal erosive changes in 3-5th MCP joints without significant joint space narrowing. Marked erosive changes and joint space narrowing is present at the 4th and 5th PIP joints. On the lateral view there is soft tissue swelling along the dorsal MCP joints. These findings can relate to the provided history of inflammatory arthritis. Vascular calcifications.  ??    Assessment  Assessment and plan  Kenneth Hensley is a 69 y.o. male with      Seropositive erosive RA: APRs have remained somewhat elevated despite good clinical control of diseaes. Previously had excellent control on MTX, but weened off after onset of CKD ~2011. CKD has been stable for some time. Became more symptomatic so restarted MTX 11/2014. Still with ~1hr AM stiffness with progression of erosions and elevated ESR on last check.  Ranae Plumber 20mg  qD  - increase MXT 15mg    - HCQ 200mg  bid    CKD III:   - Close monitoring on MTX    Secondary Sjogren's:  - Restasis    DM type I: On insulin  since 1960s. 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     Therapeutic drug monitoring:   - safety labs and APRs q50mo  - Optho yearly     Lanice Schwab M.D.  [facility]  [address]  [place], [place]  [address]  [phone]

## 2015-01-19 ENCOUNTER — Ambulatory Visit: Admit: 2015-01-19 | Discharge: 2015-01-19 | Payer: Medicare (Managed Care) | Attending: Rheumatology

## 2015-01-19 ENCOUNTER — Ambulatory Visit: Admit: 2015-01-19 | Payer: Medicare (Managed Care)

## 2015-01-19 DIAGNOSIS — Z79899 Other long term (current) drug therapy: Secondary | ICD-10-CM

## 2015-01-19 DIAGNOSIS — M059 Rheumatoid arthritis with rheumatoid factor, unspecified: Secondary | ICD-10-CM

## 2015-01-19 LAB — HEPATIC FUNCTION PANEL, SERUM
ALT: 35 U/L (ref 7–52)
AST (SGOT): 36 U/L (ref 13–39)
Albumin: 3.9 g/dL (ref 3.5–5.7)
Alkaline Phosphatase: 94 U/L (ref 36–125)
Bilirubin, Direct: 0.22 mg/dL (ref 0.00–0.40)
Bilirubin, Indirect: 0.58 mg/dL (ref 0.00–1.10)
Total Bilirubin: 0.8 mg/dL (ref 0.0–1.5)
Total Protein: 6.6 g/dL (ref 6.4–8.9)

## 2015-01-19 LAB — CBC
Hematocrit: 38.9 % (ref 38.5–50.0)
Hemoglobin: 12.6 g/dL (ref 13.2–17.1)
MCH: 29.9 pg (ref 27.0–33.0)
MCHC: 32.4 g/dL (ref 32.0–36.0)
MCV: 92.3 fL (ref 80.0–100.0)
MPV: 7.3 fL (ref 7.5–11.5)
Platelets: 182 10*3/uL (ref 140–400)
RBC: 4.21 10*6/uL (ref 4.20–5.80)
RDW: 17 % (ref 11.0–15.0)
WBC: 4.7 10*3/uL (ref 3.8–10.8)

## 2015-01-19 LAB — SED RATE: Sed Rate: 8 mm/h (ref 0–20)

## 2015-01-19 LAB — CREATININE, SERUM
Creatinine: 1.4 mg/dL (ref 0.60–1.30)
GFR MDRD Af Amer: 61 See note.
GFR MDRD Non Af Amer: 50 See note.

## 2015-01-19 MED ORDER — methotrexate 2.5 MG tablet
2.5 | ORAL_TABLET | ORAL | 0.00 refills | 28.00000 days | Status: AC
Start: 2015-01-19 — End: 2015-04-01

## 2015-03-10 MED ORDER — leflunomide (ARAVA) 20 MG tablet
20 | ORAL_TABLET | ORAL | Status: AC
Start: 2015-03-10 — End: 2015-04-20

## 2015-03-10 NOTE — Unmapped (Signed)
MEDICATION REQUESTED:         MOST CURRENT LAB( MEDICAL ASSISTANT SHOULD CHECK MEDICAL RECORDS OFFICE, [facility] AND CARE EVERYWHERE FOR LABS AND THEN NOTE WHERE THE LABS ARE LOCATED AND ANY ABNORMALITIES HERE IN THIS MESSAGE):       Appointment on 01/19/2015   Component Date Value Ref Range Status   ??? WBC 01/19/2015 4.7  3.8 - 10.8 10E3/uL Final   ??? RBC 01/19/2015 4.21  4.20 - 5.80 10E6/uL Final   ??? Hemoglobin 01/19/2015 12.6* 13.2 - 17.1 g/dL Final   ??? Hematocrit 01/19/2015 38.9  38.5 - 50.0 % Final   ??? MCV 01/19/2015 92.3  80.0 - 100.0 fL Final   ??? MCH 01/19/2015 29.9  27.0 - 33.0 pg Final   ??? MCHC 01/19/2015 32.4  32.0 - 36.0 g/dL Final   ??? RDW 09/60/4540 17.0* 11.0 - 15.0 % Final   ??? Platelets 01/19/2015 182  140 - 400 10E3/uL Final   ??? MPV 01/19/2015 7.3* 7.5 - 11.5 fL Final   ??? Total Bilirubin 01/19/2015 0.8  0.0 - 1.5 mg/dL Final   ??? Bilirubin, Direct 01/19/2015 0.22  0.00 - 0.40 mg/dL Final   ??? AST (SGOT) 01/19/2015 36  13 - 39 U/L Final   ??? ALT 01/19/2015 35  7 - 52 U/L Final   ??? Alkaline Phosphatase 01/19/2015 94  36 - 125 U/L Final   ??? Total Protein 01/19/2015 6.6  6.4 - 8.9 g/dL Final   ??? Albumin 98/09/9146 3.9  3.5 - 5.7 g/dL Final   ??? Bilirubin, Indirect 01/19/2015 0.58  0.00 - 1.10 mg/dL Final   ??? Creatinine 01/19/2015 1.40* 0.60 - 1.30 mg/dL Final   ??? GFR MDRD Af Amer 01/19/2015 61   Final    Comment: GFR is estimated using creatinine, age, gender and race. Patient's values should be interpreted as a trend.      Between 30 and 90 mL/min/1.78m2, clinical correlation is needed.     For additional information:   [email or web address]       ??? GFR MDRD Non Af Amer 01/19/2015 50   Final    Comment: GFR is estimated using creatinine, age, gender and race. Patient's values should be interpreted as a trend.      Between 30 and 90 mL/min/1.54m2, clinical correlation is needed.     For additional information:   [email or web address]       ??? Sed Rate 01/19/2015 8  0 - 20 mm/hr Final         LAST APPT:          ALLERGIES:        No Known Allergies    (MEDICAL ASSISTANT SHOULD CHECK [facility] EP[facility] AND DOCUMENT IN ALLERGY SECTION                                            AND MARK AS REVIEWED)

## 2015-03-18 NOTE — Unmapped (Signed)
RHEUMATOLOGY FOLLOW UP PATIENT VISIT    03/18/2015    CHIEF COMPLAINT  No chief complaint on file.      HISTORY OF PRESENT ILLNESS  Kenneth Hensley is a 69 y.o. 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    Interim history:  Last visit improved since restarting MTX but still with ~1hr AM stiffness so increased dose.    safety labs and APRs     REVIEW OF SYSTEMS  eye dryness    Rest of ROS reviewed and are negative       PAST Medical History    Past Medical History   Diagnosis Date   ??? Unspecified inflammatory polyarthropathy    ??? Rheumatoid arthritis(714.0)        MEDICATIONS  Current Outpatient Prescriptions   Medication Sig Dispense Refill   ??? aspirin 81 MG EC tablet Take 81 mg by mouth daily.     ??? fish oil-omega-3 fatty acids 300-1,000 mg capsule Take 2 g by mouth daily.     ??? folic acid (FOLVITE) 1 MG tablet Take 1 tablet (1 mg total) by mouth daily. TAKE ONE TABLET BY MOUTH EVERY DAY 30 tablet 5   ??? hydroxychloroquine (PLAQUENIL) 200 mg tablet TAKE ONE TABLET BY MOUTH TWICE A DAY 60 tablet 4   ??? leflunomide (ARAVA) 20 MG tablet TAKE ONE TABLET BY MOUTH DAILY 30 tablet 0   ??? methotrexate 2.5 MG tablet Take 6 tablets (15 mg total) by mouth Every Sunday. Indications: Rheumatoid Arthritis 24 tablet 1   ??? multivitamin (MULTIVITAMIN) tablet Take 1 tablet by mouth daily.     ??? NOVOLOG 100 unit/mL injection      ??? omeprazole (PRILOSEC) 20 MG capsule Take 20 mg by mouth every morning before breakfast.     ??? RESTASIS 0.05 % ophthalmic emulsion        No current facility-administered medications for this visit.         PHYSICAL EXAM   There were no vitals filed for this visit.  Constitutional:  Well developed, well nourished, no acute distress  Eyes:  PERRL, extra ocular movements intact, conjunctiva normal   Musculoskeletal: No synovitis, multiple heberden's nodes.  Integument:  Well hydrated, no rash or telangiectasias  Lymphatic:  No lymphadenopathy noted   Neurologic:   Alert & oriented x 3. Sensations Intact. Muscle strength  5/5 proximally and distally in upper and lower extremities.   Psychiatric:  Speech and behavior appropriate       LABS  Lab Results   Component Value Date    WBC 4.7 01/19/2015    HGB 12.6* 01/19/2015    HCT 38.9 01/19/2015    MCV 92.3 01/19/2015    PLT 182 01/19/2015      Lab Results   Component Value Date    CREATININE 1.40* 01/19/2015      Lab Results   Component Value Date    ALT 35 01/19/2015    ALKPHOS 94 01/19/2015    BILITOT 0.8 01/19/2015      No results found for: VITD25H   No results found for: CRP   Lab Results   Component Value Date    ESR 8 01/19/2015      RF 201  CCP >250    IMAGING  Hands (09/2012)  Right:  1. Severe joint space narrowing and erosions involving the first MCP joint and fourth and fifth PIP joints. Differential diagnosis includes psoriatic arthritis, reactive arthritis, rheumatoid arthritis, or erosive osteoarthritis. Psoriatic arthritis is favored over  rheumatoid arthritis due to the symmetry with the right hand.  2. Multiple erosions are also seen in the carpus, some of which demonstrate well-defined sclerotic margins. This appearance can be seen with amyloidosis. There is diffuse small vessel atherosclerosis which can be seen with chronic renal insufficiency which predisposes to amyloidosis.    Left:  1. Erosive arthritis primarily involving the 1st MCP and 3rd PIP joint. Asymmetry with the left hand is atypical of rheumatoid arthritis. Differential diagnosis includes psoriatic arthritis, rheumatoid arthritis, or reactive arthritis.  2. Small erosions are seen in the carpus. Findings could be related to erosive arthropathy with amyloidosis not completely excluded.    Hands (11/2014)  There is slight irregularity of the ulnar styloid tip with moderate overlying soft tissue swelling. There are marked erosive changes and advanced joint space narrowing at the 1st MCP joint. There is small erosion at the 1st IP and possible tiny erosions in the 2nd and 3rd MCP joints. There is marked  erosive changes at the 2nd PIP joint with advanced joint space narrowing. Minimal soft tissue swelling overlying the dorsal aspect of the MCP joint.There is mild narrowing of the third through fifth DIP joints with mild proliferative changes which may relate to mild osteoarthrosis. Vascular calcifications.  ??  Left hand: ??  Mild soft tissue swelling is present along the distal ulna. There are multifocal lucencies of the carpal bones more so at capitate, hamate, scaphotrapezial joint which are likely erosive changes. There is narrowing of the capitate hamate joint.  ??      There are erosions at the 1st MCP joint with mild joint space narrowing. There is marked destructive erosive changes with advanced narrowing at the 2nd MCP joint. Minimal erosive changes in 3-5th MCP joints without significant joint space narrowing. Marked erosive changes and joint space narrowing is present at the 4th and 5th PIP joints. On the lateral view there is soft tissue swelling along the dorsal MCP joints. These findings can relate to the provided history of inflammatory arthritis. Vascular calcifications.  ??    Assessment  Assessment and plan  Kenneth Hensley is a 69 y.o. male with      Seropositive erosive RA: APRs have remained somewhat elevated despite good clinical control of diseaes. Previously had excellent control on MTX, but weened off after onset of CKD ~2011. CKD has been stable for some time. Became more symptomatic so restarted MTX 11/2014.  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     Therapeutic drug monitoring:   - safety labs and APRs q39mo  - Optho yearly     Lanice Schwab M.D.  [facility]  [address]  [place], [place]  [address]  [phone]

## 2015-03-23 ENCOUNTER — Encounter: Payer: Medicare (Managed Care) | Attending: Rheumatology

## 2015-03-24 NOTE — Unmapped (Signed)
RHEUMATOLOGY FOLLOW UP PATIENT VISIT    04/20/2015    CHIEF COMPLAINT  Chief Complaint   Patient presents with   ??? Joint Pain   ??? Hand Pain       HISTORY OF PRESENT ILLNESS  Kenneth Hensley is a 69 y.o. 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    Interim history:  Last visit improved since restarting MTX but still with ~1hr AM stiffness so increased dose which has improved symptoms significantly. He has a small flare 2 weeks ago in bilateral elbows which was easily managed with ASA.    REVIEW OF SYSTEMS  eye dryness, fatigue    Rest of ROS reviewed and are negative       PAST Medical History    Past Medical History   Diagnosis Date   ??? Unspecified inflammatory polyarthropathy    ??? Rheumatoid arthritis(714.0)        MEDICATIONS  Current Outpatient Prescriptions   Medication Sig Dispense Refill   ??? aspirin 81 MG EC tablet Take 81 mg by mouth daily.     ??? fish oil-omega-3 fatty acids 300-1,000 mg capsule Take 2 g by mouth daily.     ??? folic acid (FOLVITE) 1 MG tablet Take 1 tablet (1 mg total) by mouth daily. TAKE ONE TABLET BY MOUTH EVERY DAY 30 tablet 5   ??? hydroxychloroquine (PLAQUENIL) 200 mg tablet TAKE ONE TABLET BY MOUTH TWICE A DAY 60 tablet 4   ??? leflunomide (ARAVA) 20 MG tablet Take 1 tablet (20 mg total) by mouth daily. 30 tablet 1   ??? [START ON 04/25/2015] methotrexate 2.5 MG tablet Take 6 tablets (15 mg total) by mouth Every Monday. Indications: Rheumatoid Arthritis 24 tablet 1   ??? multivitamin (MULTIVITAMIN) tablet Take 1 tablet by mouth daily.     ??? NOVOLOG 100 unit/mL injection      ??? omeprazole (PRILOSEC) 20 MG capsule Take 20 mg by mouth every morning before breakfast.     ??? RESTASIS 0.05 % ophthalmic emulsion        No current facility-administered medications for this visit.         PHYSICAL EXAM   Filed Vitals:    04/20/15 1249   BP: 107/54   Height: 5' 9 (1.753 m)   Weight: 212 lb (96.163 kg)     Constitutional:  Well developed, well nourished, no acute distress  Eyes:  PERRL, extra ocular movements  intact, conjunctiva normal   Musculoskeletal: No synovitis, multiple heberden's nodes.  Integument:  Well hydrated, no rash or telangiectasias  Lymphatic:  No lymphadenopathy noted   Neurologic:   Alert & oriented x 3. Sensations Intact. Muscle strength 5/5 proximally and distally in upper and lower extremities.   Psychiatric:  Speech and behavior appropriate       LABS  Lab Results   Component Value Date    WBC 3.9 03/30/2015    HGB 12.7* 03/30/2015    HCT 38.0* 03/30/2015    MCV 93.7 03/30/2015    PLT 161 03/30/2015      Lab Results   Component Value Date    CREATININE 1.61* 03/30/2015      Lab Results   Component Value Date    ALT 35 03/30/2015    ALKPHOS 90 03/30/2015    BILITOT 0.8 03/30/2015      No results found for: VITD25H   No results found for: CRP   Lab Results   Component Value Date    ESR 13 03/30/2015  RF 201  CCP >250    IMAGING  Hands (09/2012)  Right:  1. Severe joint space narrowing and erosions involving the first MCP joint and fourth and fifth PIP joints. Differential diagnosis includes psoriatic arthritis, reactive arthritis, rheumatoid arthritis, or erosive osteoarthritis. Psoriatic arthritis is favored over rheumatoid arthritis due to the symmetry with the right hand.  2. Multiple erosions are also seen in the carpus, some of which demonstrate well-defined sclerotic margins. This appearance can be seen with amyloidosis. There is diffuse small vessel atherosclerosis which can be seen with chronic renal insufficiency which predisposes to amyloidosis.    Left:  1. Erosive arthritis primarily involving the 1st MCP and 3rd PIP joint. Asymmetry with the left hand is atypical of rheumatoid arthritis. Differential diagnosis includes psoriatic arthritis, rheumatoid arthritis, or reactive arthritis.  2. Small erosions are seen in the carpus. Findings could be related to erosive arthropathy with amyloidosis not completely excluded.    Hands (11/2014)  There is slight irregularity of the ulnar styloid  tip with moderate overlying soft tissue swelling. There are marked erosive changes and advanced joint space narrowing at the 1st MCP joint. There is small erosion at the 1st IP and possible tiny erosions in the 2nd and 3rd MCP joints. There is marked erosive changes at the 2nd PIP joint with advanced joint space narrowing. Minimal soft tissue swelling overlying the dorsal aspect of the MCP joint.There is mild narrowing of the third through fifth DIP joints with mild proliferative changes which may relate to mild osteoarthrosis. Vascular calcifications.  ??  Left hand: ??  Mild soft tissue swelling is present along the distal ulna. There are multifocal lucencies of the carpal bones more so at capitate, hamate, scaphotrapezial joint which are likely erosive changes. There is narrowing of the capitate hamate joint.  ??      There are erosions at the 1st MCP joint with mild joint space narrowing. There is marked destructive erosive changes with advanced narrowing at the 2nd MCP joint. Minimal erosive changes in 3-5th MCP joints without significant joint space narrowing. Marked erosive changes and joint space narrowing is present at the 4th and 5th PIP joints. On the lateral view there is soft tissue swelling along the dorsal MCP joints. These findings can relate to the provided history of inflammatory arthritis. Vascular calcifications.  ??    Assessment  Assessment and plan  Kenneth Hensley is a 69 y.o. male with      Seropositive erosive RA: APRs have remained somewhat elevated despite good clinical control of diseaes. Previously had excellent control on MTX, but weened off after onset of CKD ~2011. CKD has been stable for some time. Became more symptomatic so restarted MTX 11/2014.  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 Multiple comorbidities most pronounced is peripheral neuropathy with chronic  non-healing ulcer on left foot, long standing charcot arthropathy. H/o retinopathy s/p laser cautery left eye.     Therapeutic drug monitoring:   - safety labs and APRs q83mo    Lanice Schwab M.D.  [facility]  [address]  [place], [place]  [address]  [phone]

## 2015-03-30 ENCOUNTER — Other Ambulatory Visit: Admit: 2015-03-30 | Discharge: 2015-03-30 | Payer: Medicare (Managed Care)

## 2015-03-30 DIAGNOSIS — Z79899 Other long term (current) drug therapy: Secondary | ICD-10-CM

## 2015-03-30 LAB — HEPATIC FUNCTION PANEL, SERUM
ALT: 35 U/L (ref 7–52)
AST (SGOT): 40 U/L (ref 13–39)
Albumin: 3.8 g/dL (ref 3.5–5.7)
Alkaline Phosphatase: 90 U/L (ref 36–125)
Bilirubin, Direct: 0.18 mg/dL (ref 0.00–0.40)
Bilirubin, Indirect: 0.62 mg/dL (ref 0.00–1.10)
Total Bilirubin: 0.8 mg/dL (ref 0.0–1.5)
Total Protein: 6.4 g/dL (ref 6.4–8.9)

## 2015-03-30 LAB — CREATININE, SERUM
Creatinine: 1.61 mg/dL (ref 0.60–1.30)
eGFR AA CKD-EPI: 49 See note.
eGFR NONAA CKD-EPI: 43 See note.

## 2015-03-30 LAB — SED RATE: Sed Rate: 13 mm/hr (ref 0–20)

## 2015-03-30 LAB — CBC
Hematocrit: 38 % (ref 38.5–50.0)
Hemoglobin: 12.7 g/dL (ref 13.2–17.1)
MCH: 31.2 pg (ref 27.0–33.0)
MCHC: 33.4 g/dL (ref 32.0–36.0)
MCV: 93.7 fL (ref 80.0–100.0)
MPV: 7.4 fL (ref 7.5–11.5)
Platelets: 161 10*3/uL (ref 140–400)
RBC: 4.06 10*6/uL (ref 4.20–5.80)
RDW: 15.2 % (ref 11.0–15.0)
WBC: 3.9 10*3/uL (ref 3.8–10.8)

## 2015-04-01 MED ORDER — methotrexate 2.5 MG tablet
2.5 | ORAL_TABLET | ORAL | 0.00 refills | 28.00000 days | Status: AC
Start: 2015-04-01 — End: 2015-04-11

## 2015-04-01 NOTE — Unmapped (Signed)
Abnl LFTs, possibly related to meds or could be lab error, recheck 1 month

## 2015-04-01 NOTE — Unmapped (Signed)
MEDICATION REQUESTED:     methotrexate    MOST CURRENT LAB( MEDICAL ASSISTANT SHOULD CHECK MEDICAL RECORDS OFFICE, [facility] AND CARE EVERYWHERE FOR LABS AND THEN NOTE WHERE THE LABS ARE LOCATED AND ANY ABNORMALITIES HERE IN THIS MESSAGE):       Specimen on 03/30/2015   Component Date Value Ref Range Status   ??? WBC 03/30/2015 3.9  3.8 - 10.8 10E3/uL Final   ??? RBC 03/30/2015 4.06* 4.20 - 5.80 10E6/uL Final   ??? Hemoglobin 03/30/2015 12.7* 13.2 - 17.1 g/dL Final   ??? Hematocrit 03/30/2015 38.0* 38.5 - 50.0 % Final   ??? MCV 03/30/2015 93.7  80.0 - 100.0 fL Final   ??? MCH 03/30/2015 31.2  27.0 - 33.0 pg Final   ??? MCHC 03/30/2015 33.4  32.0 - 36.0 g/dL Final   ??? RDW 78/46/9629 15.2* 11.0 - 15.0 % Final   ??? Platelets 03/30/2015 161  140 - 400 10E3/uL Final   ??? MPV 03/30/2015 7.4* 7.5 - 11.5 fL Final   ??? Sed Rate 03/30/2015 13  0 - 20 mm/hr Final   ??? Total Bilirubin 03/30/2015 0.8  0.0 - 1.5 mg/dL Final   ??? Bilirubin, Direct 03/30/2015 0.18  0.00 - 0.40 mg/dL Final   ??? AST (SGOT) 03/30/2015 40* 13 - 39 U/L Final   ??? ALT 03/30/2015 35  7 - 52 U/L Final   ??? Alkaline Phosphatase 03/30/2015 90  36 - 125 U/L Final   ??? Total Protein 03/30/2015 6.4  6.4 - 8.9 g/dL Final   ??? Albumin 52/84/1324 3.8  3.5 - 5.7 g/dL Final   ??? Bilirubin, Indirect 03/30/2015 0.62  0.00 - 1.10 mg/dL Final   ??? Creatinine 03/30/2015 1.61* 0.60 - 1.30 mg/dL Final   ??? eGFR AA CKD-EPI 03/30/2015 49   Final   ??? eGFR NONAA CKD-EPI 03/30/2015 43   Final         LAST APPT:      01/19/15    ALLERGIES:       No Known Allergies    (MEDICAL ASSISTANT SHOULD CHECK [facility] AND DOCUMENT IN ALLERGY SECTION                                            AND MARK AS REVIEWED)

## 2015-04-04 NOTE — Unmapped (Signed)
Pt has been notified, he will recheck labs in 1 month

## 2015-04-11 MED ORDER — methotrexate 2.5 MG tablet
2.5 | ORAL_TABLET | ORAL | 0.00 refills | 28.00000 days | Status: AC
Start: 2015-04-11 — End: 2015-04-20

## 2015-04-11 NOTE — Unmapped (Signed)
This should have gone to [facility] on 5/6

## 2015-04-20 ENCOUNTER — Ambulatory Visit: Admit: 2015-04-20 | Discharge: 2015-04-20 | Payer: Medicare (Managed Care) | Attending: Rheumatology

## 2015-04-20 DIAGNOSIS — M059 Rheumatoid arthritis with rheumatoid factor, unspecified: Secondary | ICD-10-CM

## 2015-04-20 MED ORDER — leflunomide (ARAVA) 20 MG tablet
20 | ORAL_TABLET | Freq: Every day | ORAL | Status: AC
Start: 2015-04-20 — End: 2015-07-13

## 2015-04-20 MED ORDER — methotrexate 2.5 MG tablet
2.5 | ORAL_TABLET | ORAL | 0.00 refills | 28.00000 days | Status: AC
Start: 2015-04-20 — End: 2015-07-06

## 2015-05-04 MED ORDER — folic acid (FOLVITE) 1 MG tablet
1 | ORAL_TABLET | Freq: Every day | ORAL | 2.00 refills | 30.00000 days | Status: AC
Start: 2015-05-04 — End: 2015-11-24

## 2015-05-04 NOTE — Unmapped (Signed)
MEDICATION REQUESTED:    Folic acid      LAST APPT:    04/20/15      ALLERGIES:   No Known Allergies

## 2015-05-10 ENCOUNTER — Other Ambulatory Visit: Admit: 2015-05-10 | Discharge: 2015-05-10 | Payer: Medicare (Managed Care)

## 2015-05-10 DIAGNOSIS — Z79899 Other long term (current) drug therapy: Secondary | ICD-10-CM

## 2015-05-10 LAB — HEPATIC FUNCTION PANEL, SERUM
ALT: 38 U/L (ref 7–52)
AST (SGOT): 41 U/L (ref 13–39)
Albumin: 4.1 g/dL (ref 3.5–5.7)
Alkaline Phosphatase: 85 U/L (ref 36–125)
Bilirubin, Direct: 0.25 mg/dL (ref 0.00–0.40)
Bilirubin, Indirect: 0.85 mg/dL (ref 0.00–1.10)
Total Bilirubin: 1.1 mg/dL (ref 0.0–1.5)
Total Protein: 7 g/dL (ref 6.4–8.9)

## 2015-05-10 LAB — CBC
Hematocrit: 39.6 % (ref 38.5–50.0)
Hemoglobin: 12.9 g/dL (ref 13.2–17.1)
MCH: 31 pg (ref 27.0–33.0)
MCHC: 32.7 g/dL (ref 32.0–36.0)
MCV: 95 fL (ref 80.0–100.0)
MPV: 7 fL (ref 7.5–11.5)
Platelets: 199 10*3/uL (ref 140–400)
RBC: 4.16 10*6/uL (ref 4.20–5.80)
RDW: 17.2 % (ref 11.0–15.0)
WBC: 6.1 10*3/uL (ref 3.8–10.8)

## 2015-05-10 LAB — SED RATE: Sed Rate: 14 mm/hr (ref 0–20)

## 2015-05-10 LAB — CREATININE, SERUM
Creatinine: 1.43 mg/dL (ref 0.60–1.30)
eGFR AA CKD-EPI: 57 See note.
eGFR NONAA CKD-EPI: 49 See note.

## 2015-06-22 ENCOUNTER — Encounter: Payer: Medicare (Managed Care) | Attending: Rheumatology

## 2015-06-29 ENCOUNTER — Encounter: Payer: Medicare (Managed Care) | Attending: Rheumatology

## 2015-07-04 ENCOUNTER — Other Ambulatory Visit: Admit: 2015-07-04 | Discharge: 2015-07-04 | Payer: Medicare (Managed Care)

## 2015-07-04 DIAGNOSIS — Z79899 Other long term (current) drug therapy: Secondary | ICD-10-CM

## 2015-07-04 LAB — HEPATIC FUNCTION PANEL, SERUM
ALT: 37 U/L (ref 7–52)
AST (SGOT): 30 U/L (ref 13–39)
Albumin: 3.9 g/dL (ref 3.5–5.7)
Alkaline Phosphatase: 87 U/L (ref 36–125)
Bilirubin, Direct: 0.17 mg/dL (ref 0.00–0.40)
Bilirubin, Indirect: 0.53 mg/dL (ref 0.00–1.10)
Total Bilirubin: 0.7 mg/dL (ref 0.0–1.5)
Total Protein: 6.6 g/dL (ref 6.4–8.9)

## 2015-07-04 LAB — CBC
Hematocrit: 40.8 % (ref 38.5–50.0)
Hemoglobin: 13.1 g/dL (ref 13.2–17.1)
MCH: 31.1 pg (ref 27.0–33.0)
MCHC: 32 g/dL (ref 32.0–36.0)
MCV: 97.1 fL (ref 80.0–100.0)
MPV: 7.2 fL (ref 7.5–11.5)
Platelets: 160 10*3/uL (ref 140–400)
RBC: 4.2 10*6/uL (ref 4.20–5.80)
RDW: 17.1 % (ref 11.0–15.0)
WBC: 5.2 10*3/uL (ref 3.8–10.8)

## 2015-07-04 LAB — SED RATE: Sed Rate: 10 mm/h (ref 0–20)

## 2015-07-04 LAB — CREATININE, SERUM
Creatinine: 1.3 mg/dL (ref 0.60–1.30)
eGFR AA CKD-EPI: 64 See note.
eGFR NONAA CKD-EPI: 55 See note.

## 2015-07-05 NOTE — Unmapped (Signed)
safety labs look good. Continue on MTX

## 2015-07-06 MED ORDER — methotrexate 2.5 MG tablet
2.5 | ORAL_TABLET | ORAL | 0.00 refills | 28.00000 days | Status: AC
Start: 2015-07-06 — End: 2015-11-26

## 2015-07-06 NOTE — Unmapped (Signed)
Pt notified   rx for renewal mtx pended

## 2015-07-06 NOTE — Unmapped (Signed)
Left message for pt to call

## 2015-07-08 NOTE — Unmapped (Signed)
RHEUMATOLOGY FOLLOW UP PATIENT VISIT    07/13/2015    CHIEF COMPLAINT  Chief Complaint   Patient presents with   ??? Fatigue   ??? Muscle Pain       HISTORY OF PRESENT ILLNESS  Kenneth Hensley is a 69 y.o. 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    Interim history:  Last visit doing well since restarting MTX so d/cd HCQ. His joints remain well controlled, but he's noticed progreessive worsening myalgias over the last several months (prior to stopping HCQ). He is also c/o progressive fatigue which is actually his main complaint today. He has sleep apnea and is being assessed for CPAP.     REVIEW OF SYSTEMS  eye dryness, fatigue    Rest of ROS reviewed and are negative       PAST Medical History    Past Medical History   Diagnosis Date   ??? Unspecified inflammatory polyarthropathy    ??? Rheumatoid arthritis(714.0)        MEDICATIONS  Current Outpatient Prescriptions   Medication Sig Dispense Refill   ??? aspirin 81 MG EC tablet Take 81 mg by mouth daily.     ??? fish oil-omega-3 fatty acids 300-1,000 mg capsule Take 2 g by mouth daily.     ??? folic acid (FOLVITE) 1 MG tablet Take 1 tablet (1 mg total) by mouth daily. TAKE ONE TABLET BY MOUTH EVERY DAY 30 tablet 5   ??? INSULIN LISPRO (HUMALOG SUBQ) Inject subcutaneously.     ??? leflunomide (ARAVA) 20 MG tablet Take 1 tablet (20 mg total) by mouth daily. 30 tablet 1   ??? methotrexate 2.5 MG tablet Take 6 tablets (15 mg total) by mouth Every Wednesday. Indications: Rheumatoid Arthritis 24 tablet 2   ??? multivitamin (MULTIVITAMIN) tablet Take 1 tablet by mouth daily.     ??? RANITIDINE HCL ORAL Take by mouth.     ??? RESTASIS 0.05 % ophthalmic emulsion        No current facility-administered medications for this visit.         PHYSICAL EXAM   Filed Vitals:    07/13/15 1446   BP: 116/59   Pulse: 78   Height: 5' 9 (1.753 m)   Weight: 208 lb (94.348 kg)     Constitutional:  Well developed, well nourished, no acute distress  Eyes:  PERRL, extra ocular movements intact, conjunctiva normal    Musculoskeletal: No synovitis, multiple heberden's nodes.  Integument:  Well hydrated, no rash or telangiectasias  Lymphatic:  No lymphadenopathy noted   Neurologic:   Alert & oriented x 3. Sensations Intact. Muscle strength 5/5 proximally and distally in upper and lower extremities.   Psychiatric:  Speech and behavior appropriate       LABS  Lab Results   Component Value Date    WBC 5.2 07/04/2015    HGB 13.1* 07/04/2015    HCT 40.8 07/04/2015    MCV 97.1 07/04/2015    PLT 160 07/04/2015      Lab Results   Component Value Date    CREATININE 1.30 07/04/2015      Lab Results   Component Value Date    ALT 37 07/04/2015    ALKPHOS 87 07/04/2015    BILITOT 0.7 07/04/2015      No results found for: VITD25H   No results found for: CRP   Lab Results   Component Value Date    ESR 10 07/04/2015      RF 201  CCP >250  IMAGING  Hands (09/2012)  Right:  1. Severe joint space narrowing and erosions involving the first MCP joint and fourth and fifth PIP joints. Differential diagnosis includes psoriatic arthritis, reactive arthritis, rheumatoid arthritis, or erosive osteoarthritis. Psoriatic arthritis is favored over rheumatoid arthritis due to the symmetry with the right hand.  2. Multiple erosions are also seen in the carpus, some of which demonstrate well-defined sclerotic margins. This appearance can be seen with amyloidosis. There is diffuse small vessel atherosclerosis which can be seen with chronic renal insufficiency which predisposes to amyloidosis.    Left:  1. Erosive arthritis primarily involving the 1st MCP and 3rd PIP joint. Asymmetry with the left hand is atypical of rheumatoid arthritis. Differential diagnosis includes psoriatic arthritis, rheumatoid arthritis, or reactive arthritis.  2. Small erosions are seen in the carpus. Findings could be related to erosive arthropathy with amyloidosis not completely excluded.    Hands (11/2014)  There is slight irregularity of the ulnar styloid tip with moderate overlying  soft tissue swelling. There are marked erosive changes and advanced joint space narrowing at the 1st MCP joint. There is small erosion at the 1st IP and possible tiny erosions in the 2nd and 3rd MCP joints. There is marked erosive changes at the 2nd PIP joint with advanced joint space narrowing. Minimal soft tissue swelling overlying the dorsal aspect of the MCP joint.There is mild narrowing of the third through fifth DIP joints with mild proliferative changes which may relate to mild osteoarthrosis. Vascular calcifications.  ??  Left hand: ??  Mild soft tissue swelling is present along the distal ulna. There are multifocal lucencies of the carpal bones more so at capitate, hamate, scaphotrapezial joint which are likely erosive changes. There is narrowing of the capitate hamate joint.  ??      There are erosions at the 1st MCP joint with mild joint space narrowing. There is marked destructive erosive changes with advanced narrowing at the 2nd MCP joint. Minimal erosive changes in 3-5th MCP joints without significant joint space narrowing. Marked erosive changes and joint space narrowing is present at the 4th and 5th PIP joints. On the lateral view there is soft tissue swelling along the dorsal MCP joints. These findings can relate to the provided history of inflammatory arthritis. Vascular calcifications.  ??    Assessment  Assessment and plan  Kenneth Hensley is a 69 y.o. male with      Seropositive erosive RA: APRs have remained somewhat elevated despite good clinical control of diseaes. Previously had excellent control on MTX, but weened off after onset of CKD ~2011. CKD has been stable for some time. Became more symptomatic so restarted MTX 11/2014. No increase in arthritis off of HCQ, He'd like to try weening off Arava at this point.  - hold Arava 20mg  qD  - MXT 15mg      Fatigue: this is his main complaint. Seems to be getting worse. He likely has sleep apnea and is being evaluated for CPAP.  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 Mild.  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 H/o retinopathy s/p laser cautery left eye.     Therapeutic drug monitoring:   - safety labs and APRs q32mo    Lanice Schwab M.D.  [facility]  [address], [address]  [place], [place]  [address]  [phone]

## 2015-07-13 ENCOUNTER — Ambulatory Visit: Admit: 2015-07-13 | Discharge: 2015-07-13 | Payer: Medicare (Managed Care) | Attending: Rheumatology

## 2015-07-13 DIAGNOSIS — M059 Rheumatoid arthritis with rheumatoid factor, unspecified: Secondary | ICD-10-CM

## 2015-07-31 ENCOUNTER — Emergency Department (HOSPITAL_COMMUNITY): Payer: Medicare Other

## 2015-07-31 ENCOUNTER — Encounter (HOSPITAL_COMMUNITY): Payer: Self-pay | Admitting: Cardiology

## 2015-07-31 ENCOUNTER — Emergency Department (HOSPITAL_COMMUNITY)
Admission: EM | Admit: 2015-07-31 | Discharge: 2015-07-31 | Disposition: A | Discharge status: Home / Self Care | Payer: Medicare Other | Attending: Emergency Medicine | Admitting: Emergency Medicine

## 2015-07-31 DIAGNOSIS — E119 Type 2 diabetes mellitus without complications: Secondary | ICD-10-CM | POA: Diagnosis not present

## 2015-07-31 DIAGNOSIS — M791 Myalgia: Secondary | ICD-10-CM | POA: Diagnosis not present

## 2015-07-31 DIAGNOSIS — Z87448 Personal history of other diseases of urinary system: Secondary | ICD-10-CM | POA: Diagnosis not present

## 2015-07-31 DIAGNOSIS — M25511 Pain in right shoulder: Secondary | ICD-10-CM | POA: Diagnosis present

## 2015-07-31 DIAGNOSIS — Z8669 Personal history of other diseases of the nervous system and sense organs: Secondary | ICD-10-CM | POA: Diagnosis not present

## 2015-07-31 HISTORY — DX: Polyneuropathy, unspecified: G62.9

## 2015-07-31 HISTORY — DX: Rheumatoid arthritis, unspecified: M06.9

## 2015-07-31 HISTORY — DX: Type 2 diabetes mellitus without complications: E11.9

## 2015-07-31 HISTORY — DX: Hyperkalemia: E87.5

## 2015-07-31 HISTORY — DX: Disorder of kidney and ureter, unspecified: N28.9

## 2015-07-31 LAB — CBC WITH DIFFERENTIAL/PLATELET
BASOS ABS: 0 10*3/uL (ref 0.0–0.1)
BASOS PCT: 1 % (ref 0–1)
Eosinophils Absolute: 0.4 10*3/uL (ref 0.0–0.7)
Eosinophils Relative: 7 % — ABNORMAL HIGH (ref 0–5)
HEMATOCRIT: 41.1 % (ref 39.0–52.0)
HEMOGLOBIN: 13.9 g/dL (ref 13.0–17.0)
LYMPHS PCT: 18 % (ref 12–46)
Lymphs Abs: 1.1 10*3/uL (ref 0.7–4.0)
MCH: 32.2 pg (ref 26.0–34.0)
MCHC: 33.8 g/dL (ref 30.0–36.0)
MCV: 95.1 fL (ref 78.0–100.0)
MONO ABS: 0.6 10*3/uL (ref 0.1–1.0)
MONOS PCT: 10 % (ref 3–12)
NEUTROS ABS: 4 10*3/uL (ref 1.7–7.7)
NEUTROS PCT: 64 % (ref 43–77)
Platelets: 163 10*3/uL (ref 150–400)
RBC: 4.32 MIL/uL (ref 4.22–5.81)
RDW: 14.7 % (ref 11.5–15.5)
WBC: 6.3 10*3/uL (ref 4.0–10.5)

## 2015-07-31 LAB — BASIC METABOLIC PANEL
Anion gap: 7 (ref 5–15)
BUN: 24 mg/dL — ABNORMAL HIGH (ref 6–20)
CHLORIDE: 106 mmol/L (ref 101–111)
CO2: 26 mmol/L (ref 22–32)
Calcium: 9.1 mg/dL (ref 8.9–10.3)
Creatinine, Ser: 1.36 mg/dL — ABNORMAL HIGH (ref 0.61–1.24)
GFR calc non Af Amer: 51 mL/min — ABNORMAL LOW (ref >60)
GFR, EST AFRICAN AMERICAN: 59 mL/min — AB (ref >60)
GLUCOSE: 160 mg/dL — AB (ref 65–99)
Potassium: 4.7 mmol/L (ref 3.5–5.1)
Sodium: 139 mmol/L (ref 135–145)

## 2015-07-31 LAB — TROPONIN I: Troponin I: <0.03 ng/mL (ref <0.031)

## 2015-07-31 MED ORDER — OXYCODONE-ACETAMINOPHEN 5-325 MG PO TABS
1.0000 | ORAL_TABLET | Freq: Once | ORAL | Status: AC
  Administered 2015-07-31: 1 via ORAL
  Filled 2015-07-31: qty 1

## 2015-07-31 MED ORDER — OXYCODONE-ACETAMINOPHEN 5-325 MG PO TABS: 1.0000 | ORAL_TABLET | Freq: Four times a day (QID) | ORAL | Status: DC | PRN

## 2015-07-31 NOTE — ED Provider Notes (Signed)
CSN: [id number]     Arrival date & time 07/31/15  1432 History  This chart was scribed for non-physician practitioner, Pauline Aus, PA-C working with Lavera Guise, MD, by Jarvis Morgan, ED Scribe. This patient was seen in room APFT23/APFT23 and the patient's care was started at 3:43 PM.     Chief Complaint  Patient presents with  . Shoulder Pain     The history is provided by the patient. No language interpreter was used.    HPI Comments: Andrew Schultz is a 69 y.o. male with a h/o renal disorder and neuropathy who presents to the Emergency Department complaining of constant, mild, "throbbing", right shoulder pain onset 3-4 days. He reports the pain radiates down into his right shoulder and into the upper arm. He denies any known injury. Pt states he has taken Tylenol with no relief. He notes is unable to take Ibuprofen/Naprosyn due to h/o kidney issues. He denies pain with rotation of his neck. Pt has concern for cardiac related issues but denies any h/o cardiac problems. He denies any chest pain, neck pain, SOB, numbness, weakness, or swelling of the extremity.  He also denies known injury.     Past Medical History  Diagnosis Date  . Diabetes mellitus without complication   . RA (rheumatoid arthritis)   . Neuropathy   . Renal disorder   . Hyperkalemia    Past Surgical History  Procedure Laterality Date  . Cholecystectomy    . Ambutation toes     History reviewed. No pertinent family history. Social History  Substance Use Topics  . Smoking status: Never Smoker   . Smokeless tobacco: None  . Alcohol Use: No    Review of Systems  Constitutional: Negative for diaphoresis.  Respiratory: Negative for shortness of breath.   Cardiovascular: Negative for chest pain.  Musculoskeletal: Positive for myalgias and arthralgias. Negative for joint swelling and neck pain.  Neurological: Negative for weakness and numbness.  All other systems reviewed and are negative.     Allergies   Review of patient's allergies indicates no known allergies.  Home Medications   Prior to Admission medications   Not on File   Triage Vitals: BP 138/57 mmHg  Pulse 84  Temp(Src) 97.8 F (36.6 C) (Oral)  Resp 16  Ht 5\' 9"  (1.753 m)  Wt 208 lb (94.348 kg)  BMI 30.70 kg/m2  SpO2 97%  Physical Exam  Constitutional: He is oriented to person, place, and time. He appears well-developed and well-nourished. No distress.  HENT:  Head: Normocephalic and atraumatic.  Eyes: Conjunctivae and EOM are normal.  Neck: Normal range of motion, full passive range of motion without pain and phonation normal. Neck supple. No tracheal deviation present.  Cardiovascular: Normal rate, regular rhythm, normal heart sounds and intact distal pulses.   Pulmonary/Chest: Effort normal. No respiratory distress.  Musculoskeletal: Normal range of motion. He exhibits no edema.  Pt describes pain to the right trapezius muscle extending to the right shoulder joint and distal upper arm.  He has full ROM of the shoulder and neck.  No erythema, edema, or excessive warmth of the joint.  Grip strength is strong and equal bilaterally.  Neurological: He is alert and oriented to person, place, and time. He has normal strength. No sensory deficit.  Reflex Scores:      Tricep reflexes are 2+ on the right side and 2+ on the left side.      Bicep reflexes are 2+ on the right side and 2+  on the left side. Skin: Skin is warm and dry.  Psychiatric: He has a normal mood and affect. His behavior is normal.  Nursing note and vitals reviewed.   ED Course  Procedures (including critical care time)  DIAGNOSTIC STUDIES: Oxygen Saturation is 97% on RA, normal by my interpretation.    COORDINATION OF CARE: 3:50 PM- Pt decline imaging at this time. He endorses concern that this is cardiac related. Will order EKG. Pt advised of plan for treatment and pt agrees.  4:16 PM- EKG normal, will order CXR and diagnostic blood work.  Pt  advised of plan for treatment and pt agrees.   Labs Review Labs Reviewed  CBC WITH DIFFERENTIAL/PLATELET - Abnormal; Notable for the following:    Eosinophils Relative 7 (*)    All other components within normal limits  BASIC METABOLIC PANEL - Abnormal; Notable for the following:    Glucose, Bld 160 (*)    BUN 24 (*)    Creatinine, Ser 1.36 (*)    GFR calc non Af Amer 51 (*)    GFR calc Af Amer 59 (*)    All other components within normal limits  TROPONIN I    Imaging Review Dg Chest 2 View  07/31/2015   CLINICAL DATA:  Throbbing RIGHT shoulder pain for 3 4 days.  EXAM: CHEST  2 VIEW  COMPARISON:  None.  FINDINGS: Normal cardiac silhouette. There is chronic bronchitic markings centrally. Postsurgical change in the RIGHT upper lobe with staple line. No pleural fluid pulmonary edema. No pneumothorax or focal consolidation. There is pleural thickening along the RIGHT lateral chest wall inferior to scapula shadow.  IMPRESSION: Chronic bronchitic markings.  No acute findings.  Pleural thickening along the RIGHT lower chest wall may relate to prior surgery. Without CT comparison, consider nonemergent follow-up CT thorax with contrast.   Electronically Signed   By: Genevive Bi M.D.   On: 07/31/2015 16:36   I have personally reviewed and evaluated these images and lab results as part of my medical decision-making.   EKG Interpretation   Date/Time:  Sunday July 31 2015 15:57:11 EDT Ventricular Rate:  74 PR Interval:  160 QRS Duration: 66 QT Interval:  366 QTC Calculation: 406 R Axis:   1 Text Interpretation:  Normal sinus rhythm Normal ECG No significant change  since last tracing Confirmed by LIU MD, DANA ([id number]) on 07/31/2015 4:08:45  PM      MDM   Final diagnoses:  Right shoulder pain    Pt is well appearing. Vitals stable.  Non-toxic appearing.  Clinical concern for ACS is low. Pt care discussed with Dr. Verdie Mosher and she has also seen the patient and care plan discussed.   Will order cardiac workup including serial trop.  Imaging , EKG and first trop are re-assuring.  End of shift, so pt signed out to Burgess Amor, PA-C who agrees to review pending lab and arrange appropriate disposition.     I personally performed the services described in this documentation, which was scribed in my presence. The recorded information has been reviewed and is accurate.     Pauline Aus, PA-C 08/12/15 1626  Lavera Guise, MD 08/12/15 1640

## 2015-07-31 NOTE — ED Notes (Signed)
Right shoulder pain that radiates down right arm  times 3-4 days.

## 2015-07-31 NOTE — Discharge Instructions (Signed)

## 2015-08-01 NOTE — ED Provider Notes (Signed)
Pt handed off to me from [facility]  Currently pending 3 hour delta troponin for right shoulder pain which has been present for the past several days and is reproducible.  No worsened sx, pt comfortable while here in ed.  Second troponin negative.  Dispo'd home with tx of musculoskeletal pain.  PRN f/u anticipated.  Pt is visiting from out of town, advised recheck here for any new or worsened sx.    Burgess Amor, PA-C 08/01/15 0151  Lavera Guise, MD 08/02/15 1149

## 2015-08-02 MED ORDER — predniSONE (DELTASONE) 5 MG tablet
5 | ORAL_TABLET | ORAL | Status: AC
Start: 2015-08-02 — End: 2016-04-04

## 2015-08-02 MED ORDER — leflunomide (ARAVA) 20 MG tablet
20 | ORAL_TABLET | Freq: Every day | ORAL | Status: AC
Start: 2015-08-02 — End: 2015-10-21

## 2015-08-02 NOTE — Unmapped (Signed)
Will call in Pred 20mg  taper x 1 month and restart Arava 20mg  qD

## 2015-08-02 NOTE — Unmapped (Signed)
Pt contacted office to make MD aware he is having a flare of his arthritis with all over pain, particularly my shoulder. Pt stated there is a plan in place with the MD re: possibly going back on Arava & adding prednisone. MD made aware of conversation.

## 2015-08-03 NOTE — Unmapped (Signed)
Pt returned call. Pt made aware of MD response, pt agreeable. MD made aware.

## 2015-08-03 NOTE — Unmapped (Signed)
Left message for pt to call

## 2015-09-21 ENCOUNTER — Encounter: Payer: Medicare (Managed Care) | Attending: Rheumatology

## 2015-09-27 ENCOUNTER — Other Ambulatory Visit: Admit: 2015-09-27 | Discharge: 2015-09-27 | Payer: Medicare (Managed Care)

## 2015-09-27 DIAGNOSIS — Z79899 Other long term (current) drug therapy: Secondary | ICD-10-CM

## 2015-09-27 LAB — HEPATIC FUNCTION PANEL, SERUM
ALT: 81 U/L (ref 7–52)
AST (SGOT): 91 U/L (ref 13–39)
Albumin: 3.8 g/dL (ref 3.5–5.7)
Alkaline Phosphatase: 89 U/L (ref 36–125)
Bilirubin, Direct: 0.33 mg/dL (ref 0.00–0.40)
Bilirubin, Indirect: 1.17 mg/dL (ref 0.00–1.10)
Total Bilirubin: 1.5 mg/dL (ref 0.0–1.5)
Total Protein: 6.4 g/dL (ref 6.4–8.9)

## 2015-09-27 LAB — SED RATE: Sed Rate: 12 mm/hr (ref 0–20)

## 2015-09-27 LAB — CBC
Hematocrit: 40.8 % (ref 38.5–50.0)
Hemoglobin: 13.6 g/dL (ref 13.2–17.1)
MCH: 32.2 pg (ref 27.0–33.0)
MCHC: 33.4 g/dL (ref 32.0–36.0)
MCV: 96.4 fL (ref 80.0–100.0)
MPV: 7.2 fL (ref 7.5–11.5)
Platelets: 218 10*3/uL (ref 140–400)
RBC: 4.23 10*6/uL (ref 4.20–5.80)
RDW: 15.2 % (ref 11.0–15.0)
WBC: 6.3 10*3/uL (ref 3.8–10.8)

## 2015-09-27 LAB — CREATININE, SERUM
Creatinine: 1.42 mg/dL (ref 0.60–1.30)
eGFR AA CKD-EPI: 57 See note.
eGFR NONAA CKD-EPI: 49 See note.

## 2015-09-28 NOTE — Unmapped (Signed)
Patient notified of results and verbalized understanding. He has an appt on Monday and will discuss then

## 2015-09-28 NOTE — Unmapped (Signed)
LEFT MESSAGE FOR PT TO CALL

## 2015-09-28 NOTE — Unmapped (Signed)
Mild change in liver lab. Could be just natural variation or secondary to medication. Please ask pt to have LFTs rechecked in one month.

## 2015-09-29 NOTE — Unmapped (Addendum)
RHEUMATOLOGY FOLLOW UP PATIENT VISIT    10/03/2015    CHIEF COMPLAINT  Chief Complaint   Patient presents with   ??? Joint Pain       HISTORY OF PRESENT ILLNESS  Kenneth Hensley is a 69 y.o. 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    Interim history:  Last visit doing well so held [place]. He called a month later c/o sever flare of arthritis especially in shoulders so restarted Arava. Was also c/o sever fatigue at last visit and was starting on CPAP. Last check ALT was slightly elevated. Having flare of dry eyes and is keeping close contact with Optho. Hasn't started CPAP yet. Remains fatigued. Arthritis has remained well controlled since restarting Arava with minimal AM stiffness. With exception of flare of dry eyes he has no new complaints.      Rapid3 Score 10/03/2015 04/20/2015 01/19/2015   Rapid3 Cumulative Score (0-30) 3.5 5.5 4       REVIEW OF SYSTEMS  See HPI  The patient has been experiencing : eye dryness or irritation, trouble sleeping     Rest of ROS reviewed and are negative       PAST Medical History    Past Medical History   Diagnosis Date   ??? Unspecified inflammatory polyarthropathy    ??? Rheumatoid arthritis(714.0)        MEDICATIONS  Current Outpatient Prescriptions   Medication Sig Dispense Refill   ??? aspirin 81 MG EC tablet Take 81 mg by mouth daily.     ??? fish oil-omega-3 fatty acids 300-1,000 mg capsule Take 2 g by mouth daily.     ??? folic acid (FOLVITE) 1 MG tablet Take 1 tablet (1 mg total) by mouth daily. TAKE ONE TABLET BY MOUTH EVERY DAY 30 tablet 5   ??? INSULIN LISPRO (HUMALOG SUBQ) Inject subcutaneously.     ??? leflunomide (ARAVA) 20 MG tablet Take 1 tablet (20 mg total) by mouth daily. 30 tablet 1   ??? methotrexate 2.5 MG tablet Take 6 tablets (15 mg total) by mouth Every Wednesday. Indications: Rheumatoid Arthritis 24 tablet 2   ??? multivitamin (MULTIVITAMIN) tablet Take 1 tablet by mouth daily.     ??? predniSONE (DELTASONE) 5 MG tablet Take 4 pills daily for 1 week then 3 pills daily for 1 week then 2  pills daily for a week then 1 pill daily for a week then stop 70 tablet 0   ??? RESTASIS 0.05 % ophthalmic emulsion        No current facility-administered medications for this visit.         PHYSICAL EXAM   Filed Vitals:    10/03/15 1518   BP: 131/58   Height: 5' 11 (1.803 m)   Weight: 210 lb (95.255 kg)     Constitutional:  Well developed, well nourished, no acute distress  Eyes:  PERRL, extra ocular movements intact, conjunctiva normal   Musculoskeletal: No synovitis, multiple heberden's nodes.  Integument:  Well hydrated, no rash or telangiectasias  Lymphatic:  No lymphadenopathy noted   Neurologic:   Alert & oriented x 3. Sensations Intact. Muscle strength 5/5 proximally and distally in upper and lower extremities.   Psychiatric:  Speech and behavior appropriate       LABS  Lab Results   Component Value Date    WBC 6.3 09/27/2015    HGB 13.6 09/27/2015    HCT 40.8 09/27/2015    MCV 96.4 09/27/2015    PLT 218 09/27/2015  Lab Results   Component Value Date    CREATININE 1.42* 09/27/2015      Lab Results   Component Value Date    ALT 81* 09/27/2015    ALKPHOS 89 09/27/2015    BILITOT 1.5 09/27/2015      No results found for: VITD25H   No results found for: CRP   Lab Results   Component Value Date    ESR 12 09/27/2015      RF 201  CCP >250    IMAGING  Hands (09/2012)  Right:  1. Severe joint space narrowing and erosions involving the first MCP joint and fourth and fifth PIP joints. Differential diagnosis includes psoriatic arthritis, reactive arthritis, rheumatoid arthritis, or erosive osteoarthritis. Psoriatic arthritis is favored over rheumatoid arthritis due to the symmetry with the right hand.  2. Multiple erosions are also seen in the carpus, some of which demonstrate well-defined sclerotic margins. This appearance can be seen with amyloidosis. There is diffuse small vessel atherosclerosis which can be seen with chronic renal insufficiency which predisposes to amyloidosis.    Left:  1. Erosive arthritis  primarily involving the 1st MCP and 3rd PIP joint. Asymmetry with the left hand is atypical of rheumatoid arthritis. Differential diagnosis includes psoriatic arthritis, rheumatoid arthritis, or reactive arthritis.  2. Small erosions are seen in the carpus. Findings could be related to erosive arthropathy with amyloidosis not completely excluded.    Hands (11/2014)  There is slight irregularity of the ulnar styloid tip with moderate overlying soft tissue swelling. There are marked erosive changes and advanced joint space narrowing at the 1st MCP joint. There is small erosion at the 1st IP and possible tiny erosions in the 2nd and 3rd MCP joints. There is marked erosive changes at the 2nd PIP joint with advanced joint space narrowing. Minimal soft tissue swelling overlying the dorsal aspect of the MCP joint.There is mild narrowing of the third through fifth DIP joints with mild proliferative changes which may relate to mild osteoarthrosis. Vascular calcifications.  ??  Left hand: ??  Mild soft tissue swelling is present along the distal ulna. There are multifocal lucencies of the carpal bones more so at capitate, hamate, scaphotrapezial joint which are likely erosive changes. There is narrowing of the capitate hamate joint.  ??      There are erosions at the 1st MCP joint with mild joint space narrowing. There is marked destructive erosive changes with advanced narrowing at the 2nd MCP joint. Minimal erosive changes in 3-5th MCP joints without significant joint space narrowing. Marked erosive changes and joint space narrowing is present at the 4th and 5th PIP joints. On the lateral view there is soft tissue swelling along the dorsal MCP joints. These findings can relate to the provided history of inflammatory arthritis. Vascular calcifications.  ??    Assessment  Assessment and plan  Kenneth Hensley is a 69 y.o. male with      Seropositive erosive RA: APRs have remained somewhat elevated despite good clinical control of  diseaes. Previously had excellent control on MTX, but weened off after onset of CKD ~2011. CKD has been stable for some time. Became more symptomatic so restarted MTX 11/2014. No increase in arthritis off of HCQ.  - Arava 20mg  qD  - MXT 15mg    - recheck LFTs 1 month    Fatigue: this is his main complaint. Seems to be getting worse. He likely has sleep apnea and is being evaluated for CPAP.  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 Mild.  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 H/o retinopathy s/p laser cautery left eye.     Therapeutic drug monitoring:   - safety labs and APRs q44mo    Lanice Schwab M.D.  [facility]  [address], [address]  [place], [place]  [address]  [phone]

## 2015-10-03 ENCOUNTER — Ambulatory Visit: Admit: 2015-10-03 | Discharge: 2015-10-03 | Payer: Medicare (Managed Care) | Attending: Rheumatology

## 2015-10-03 DIAGNOSIS — M05719 Rheumatoid arthritis with rheumatoid factor of unspecified shoulder without organ or systems involvement: Secondary | ICD-10-CM

## 2015-10-24 MED ORDER — leflunomide (ARAVA) 20 MG tablet
20 | ORAL_TABLET | ORAL | Status: AC
Start: 2015-10-24 — End: 2015-11-24

## 2015-10-24 NOTE — Unmapped (Signed)
Abnl LFTs, possibly related to meds or could be lab error, recheck 1 month

## 2015-10-24 NOTE — Unmapped (Signed)
Arava refill request. 09/27/15 safety labs AST 91, ALT 81, Bili indirect 1.17, Creatinine 1.42, other labs appropriate for refill.

## 2015-10-24 NOTE — Unmapped (Signed)
MEDICATION REQUESTED:     mtx    MOST CURRENT LAB( MEDICAL ASSISTANT SHOULD CHECK MEDICAL RECORDS OFFICE, [facility] AND CARE EVERYWHERE FOR LABS AND THEN NOTE WHERE THE LABS ARE LOCATED AND ANY ABNORMALITIES HERE IN THIS MESSAGE):       Specimen on 09/27/2015   Component Date Value Ref Range Status   ??? WBC 09/27/2015 6.3  3.8 - 10.8 10E3/uL Final   ??? RBC 09/27/2015 4.23  4.20 - 5.80 10E6/uL Final   ??? Hemoglobin 09/27/2015 13.6  13.2 - 17.1 g/dL Final   ??? Hematocrit 09/27/2015 40.8  38.5 - 50.0 % Final   ??? MCV 09/27/2015 96.4  80.0 - 100.0 fL Final   ??? MCH 09/27/2015 32.2  27.0 - 33.0 pg Final   ??? MCHC 09/27/2015 33.4  32.0 - 36.0 g/dL Final   ??? RDW 54/27/0623 15.2* 11.0 - 15.0 % Final   ??? Platelets 09/27/2015 218  140 - 400 10E3/uL Final   ??? MPV 09/27/2015 7.2* 7.5 - 11.5 fL Final   ??? Sed Rate 09/27/2015 12  0 - 20 mm/hr Final   ??? Total Bilirubin 09/27/2015 1.5  0.0 - 1.5 mg/dL Final   ??? Bilirubin, Direct 09/27/2015 0.33  0.00 - 0.40 mg/dL Final   ??? AST (SGOT) 09/27/2015 91* 13 - 39 U/L Final   ??? ALT 09/27/2015 81* 7 - 52 U/L Final   ??? Alkaline Phosphatase 09/27/2015 89  36 - 125 U/L Final   ??? Total Protein 09/27/2015 6.4  6.4 - 8.9 g/dL Final   ??? Albumin 76/28/3151 3.8  3.5 - 5.7 g/dL Final   ??? Bilirubin, Indirect 09/27/2015 1.17* 0.00 - 1.10 mg/dL Final   ??? Creatinine 09/27/2015 1.42* 0.60 - 1.30 mg/dL Final   ??? eGFR AA CKD-EPI 09/27/2015 57   Final   ??? eGFR NONAA CKD-EPI 09/27/2015 49   Final         LAST APPT:      10/03/15    ALLERGIES:       Allergies   Allergen Reactions   ??? Ace Inhibitors Other (See Comments)     hypotension       (MEDICAL ASSISTANT SHOULD CHECK [facility] AND DOCUMENT IN ALLERGY SECTION                                            AND MARK AS REVIEWED)

## 2015-10-25 NOTE — Unmapped (Signed)
Pt has been made aware of abnormal lab results in previous telephone encounter.

## 2015-11-14 ENCOUNTER — Other Ambulatory Visit: Admit: 2015-11-14 | Discharge: 2015-11-14 | Payer: Medicare (Managed Care)

## 2015-11-14 DIAGNOSIS — Z79899 Other long term (current) drug therapy: Secondary | ICD-10-CM

## 2015-11-14 LAB — CREATININE, SERUM
Creatinine: 1.47 mg/dL (ref 0.60–1.30)
eGFR AA CKD-EPI: 55 See note.
eGFR NONAA CKD-EPI: 47 See note.

## 2015-11-14 LAB — CBC
Hematocrit: 42.1 % (ref 38.5–50.0)
Hemoglobin: 13.9 g/dL (ref 13.2–17.1)
MCH: 32 pg (ref 27.0–33.0)
MCHC: 33 g/dL (ref 32.0–36.0)
MCV: 97 fL (ref 80.0–100.0)
MPV: 7 fL (ref 7.5–11.5)
Platelets: 227 10*3/uL (ref 140–400)
RBC: 4.34 10*6/uL (ref 4.20–5.80)
RDW: 15.4 % (ref 11.0–15.0)
WBC: 8.4 10*3/uL (ref 3.8–10.8)

## 2015-11-14 LAB — HEPATIC FUNCTION PANEL, SERUM
ALT: 111 U/L (ref 7–52)
AST (SGOT): 138 U/L (ref 13–39)
Albumin: 4.2 g/dL (ref 3.5–5.7)
Alkaline Phosphatase: 101 U/L (ref 36–125)
Bilirubin, Direct: 0.2 mg/dL (ref 0.00–0.40)
Bilirubin, Indirect: 0.8 mg/dL (ref 0.00–1.10)
Total Bilirubin: 1 mg/dL (ref 0.0–1.5)
Total Protein: 7.1 g/dL (ref 6.4–8.9)

## 2015-11-14 LAB — SED RATE: Sed Rate: 13 mm/hr (ref 0–20)

## 2015-11-15 NOTE — Unmapped (Signed)
Patient notified and verbalized understanding. He stated that his right shoulder blade is bothering him. It feels like something is stabbing him. He went to the ER yesterday and they referred him to Ortho and he has an appt tomorrow to see them

## 2015-11-15 NOTE — Unmapped (Signed)
Liver enzymes continue to increase. Please have him decrease MTX 12.5mg  (5 pills) per week and recheck liver in 1 month. If he becomes more symptomatic we can restart plaquenil

## 2015-11-23 NOTE — Unmapped (Signed)
This is a [facility] Patient   Patient called and needs a refill of his MTX. He repeated his labs due to abnormal liver function in Nov... He repeat on 11/28/23. He did decease the MTX to 5 from 6 but only has been on 5 for 1 dose. OK to refill MTX?

## 2015-11-24 NOTE — Unmapped (Signed)
This was [facility]'s response to pts 11/14/15 lab tests:  Rebbeca Paul, MD at 11/15/2015 ??6:52 AM  ??   ?? Status: Signed       ?? Expand All Collapse All    Liver enzymes continue to increase. Please have him decrease MTX 12.5mg  (5 pills) per week and recheck liver in 1 month. If he becomes more symptomatic we can restart plaquenil            He is also on arava and we received a refill request for this. Can both be filled?

## 2015-11-24 NOTE — Unmapped (Signed)
MEDICATION REQUESTED:    Folic    LAST APPT:    10/03/15      ALLERGIES:   Allergies   Allergen Reactions   ??? Ace Inhibitors Other (See Comments)     hypotension

## 2015-11-24 NOTE — Unmapped (Signed)
I can't refill this as the patient has renal  insufficiency.He was previously on [place]

## 2015-11-25 ENCOUNTER — Other Ambulatory Visit: Admit: 2015-11-25 | Payer: Medicare (Managed Care)

## 2015-11-25 DIAGNOSIS — Z79899 Other long term (current) drug therapy: Secondary | ICD-10-CM

## 2015-11-25 LAB — HEPATIC FUNCTION PANEL, SERUM
ALT: 30 U/L (ref 7–52)
AST (SGOT): 26 U/L (ref 13–39)
Albumin: 4 g/dL (ref 3.5–5.7)
Alkaline Phosphatase: 110 U/L (ref 36–125)
Bilirubin, Direct: 0.15 mg/dL (ref 0.00–0.40)
Bilirubin, Indirect: 0.45 mg/dL (ref 0.00–1.10)
Total Bilirubin: 0.6 mg/dL (ref 0.0–1.5)
Total Protein: 6.5 g/dL (ref 6.4–8.9)

## 2015-11-25 LAB — CBC
Hematocrit: 38.6 % (ref 38.5–50.0)
Hemoglobin: 12.6 g/dL — ABNORMAL LOW (ref 13.2–17.1)
MCH: 31.3 pg (ref 27.0–33.0)
MCHC: 32.7 g/dL (ref 32.0–36.0)
MCV: 96 fL (ref 80.0–100.0)
MPV: 7.3 fL — ABNORMAL LOW (ref 7.5–11.5)
Platelets: 220 10*3/uL (ref 140–400)
RBC: 4.02 10*6/uL — ABNORMAL LOW (ref 4.20–5.80)
RDW: 15 % (ref 11.0–15.0)
WBC: 6.5 10*3/uL (ref 3.8–10.8)

## 2015-11-25 LAB — CREATININE, SERUM
Creatinine: 1.26 mg/dL (ref 0.60–1.30)
eGFR AA CKD-EPI: 66 See note.
eGFR NONAA CKD-EPI: 57 See note.

## 2015-11-25 LAB — SED RATE: Sed Rate: 10 mm/hr (ref 0–20)

## 2015-11-25 MED ORDER — leflunomide (ARAVA) 20 MG tablet
20 | ORAL_TABLET | ORAL | Status: AC
Start: 2015-11-25 — End: 2015-12-28

## 2015-11-25 MED ORDER — folic acid (FOLVITE) 1 MG tablet
1 | ORAL_TABLET | Freq: Every day | ORAL | 2.00 refills | 30.00000 days | Status: AC
Start: 2015-11-25 — End: 2016-06-04

## 2015-11-25 NOTE — Unmapped (Signed)
Pt made aware of the need to have liver fx lab redrawn prior to MTX refill. Pt verbalized understanding. Pt stated he will go to the [place] location to have lab drawn today 11/25/15 & will awair MDs instructions. Pt stated he typically takes his MTX on Sundays. MD made aware.

## 2015-11-25 NOTE — Unmapped (Signed)
I won't sign rx. I do not know why he is on both meds

## 2015-11-25 NOTE — Unmapped (Signed)
Dr Marcille Blanco he called back this morning.  He did decrease the MTX to 5 tablets.  He said that he needs a refill on both, is this correct that he is on both?

## 2015-11-26 MED ORDER — methotrexate 2.5 MG tablet
2.5 | ORAL_TABLET | ORAL | 0.00 refills | 28.00000 days | Status: AC
Start: 2015-11-26 — End: 2015-12-28

## 2015-11-26 NOTE — Unmapped (Signed)
Recheck liver function was within normal limits. Best to play it safe and lower dose to 10mg  and continue blood testing monthly for a couple of months. If arthritis not well controlled on 10mg  please ask him to make appt and we can discuss other medications.

## 2015-11-29 NOTE — Unmapped (Signed)
Left message for pt to call

## 2015-11-29 NOTE — Unmapped (Signed)
Pt notified of results   Pt will call if 10 mg does not control arthritis well

## 2015-12-26 ENCOUNTER — Other Ambulatory Visit: Admit: 2015-12-26 | Discharge: 2015-12-26 | Payer: Medicare (Managed Care)

## 2015-12-26 DIAGNOSIS — Z79899 Other long term (current) drug therapy: Secondary | ICD-10-CM

## 2015-12-26 LAB — HEPATIC FUNCTION PANEL
ALT: 19 U/L (ref 7–52)
AST: 26 U/L (ref 13–39)
Albumin: 3.9 g/dL (ref 3.5–5.7)
Alkaline Phosphatase: 95 U/L (ref 36–125)
Bilirubin, Direct: 0.23 mg/dL (ref 0.00–0.40)
Bilirubin, Indirect: 0.77 mg/dL (ref 0.00–1.10)
Total Bilirubin: 1 mg/dL (ref 0.0–1.5)
Total Protein: 6.6 g/dL (ref 6.4–8.9)

## 2015-12-26 LAB — CBC
Hematocrit: 39 % (ref 38.5–50.0)
Hemoglobin: 13 g/dL — ABNORMAL LOW (ref 13.2–17.1)
MCH: 31.6 pg (ref 27.0–33.0)
MCHC: 33.4 g/dL (ref 32.0–36.0)
MCV: 94.8 fL (ref 80.0–100.0)
MPV: 6.9 fL — ABNORMAL LOW (ref 7.5–11.5)
Platelets: 227 10E3/uL (ref 140–400)
RBC: 4.12 10E6/uL — ABNORMAL LOW (ref 4.20–5.80)
RDW: 15 % (ref 11.0–15.0)
WBC: 8 10E3/uL (ref 3.8–10.8)

## 2015-12-26 LAB — CREATININE, SERUM
Creatinine: 1.48 mg/dL — ABNORMAL HIGH (ref 0.60–1.30)
eGFR AA CKD-EPI: 54 See note.
eGFR NONAA CKD-EPI: 47 See note.

## 2015-12-26 LAB — SED RATE: Sed Rate: 27 mm/h — ABNORMAL HIGH (ref 0–20)

## 2015-12-27 NOTE — Unmapped (Signed)
Spoke with patient and he stated that he is feeling well and has not noticed any difference in lowering the dose of methotrexate. He wanted to know if the liver tests were OK and if you were going to send in his methotrexate and his arava?

## 2015-12-27 NOTE — Unmapped (Signed)
SED rate is elevated. Is his arthritis more active since decreasing dose of Methotrexate last month?

## 2015-12-27 NOTE — Unmapped (Signed)
Liver numbers look great. No problem refilling meds.

## 2015-12-28 MED ORDER — leflunomide (ARAVA) 20 MG tablet
20 | ORAL_TABLET | Freq: Every day | ORAL | Status: AC
Start: 2015-12-28 — End: 2016-02-25

## 2015-12-28 MED ORDER — methotrexate 2.5 MG tablet
2.5 | ORAL_TABLET | ORAL | 0.00 refills | 28.00000 days | Status: AC
Start: 2015-12-28 — End: 2016-02-23

## 2015-12-28 NOTE — Unmapped (Signed)
Patient notified and will repeat sed rate in one month per MD request. RX's are pended for you to sign under meds and orders

## 2016-01-09 ENCOUNTER — Ambulatory Visit: Admit: 2016-01-09 | Discharge: 2016-01-09 | Payer: MEDICARE | Attending: Orthopaedic Surgery of the Spine

## 2016-01-09 DIAGNOSIS — M4722 Other spondylosis with radiculopathy, cervical region: Secondary | ICD-10-CM

## 2016-01-09 NOTE — Progress Notes (Signed)
 History of present illness:   [name] is a pleasant 70 y.o. old male with a PMH of GERD, stage 3 kidney disease and diabetes who presents today for consultation regarding his right scapular pain. He states the pain began insidiously about 4 months ago. His states the pain was initially so severe that it required him to go to the ED for a cardiac workup. He notes his pain was localized to his right distal scapula and did not radiate. His pain initially lasted for approximately 4 weeks and then spontaneously resolved. He states the pain returned about two months later and then lasted approximately 3 weeks. He rates his neck pain 0/10 shoulder and scapular pain 8/10 at the time it was present. He describes the pain as a stabbing pain that was constant.He denies upper extremity pain, numbness or weakness. He denies, lower extremity symptoms, gait abnormality and bowel or bladder dysfunction. The pain moderately interfered with his sleep. He has tried percocet with minimal relief. His pain has since resolved and he is no longer taking any medications for pain.    Past medical history:   His past medical history has been reviewed and is significant for stage 3 kidney disease, GERD and diabetes.    Past surgical history:   His past surgical history has been reviewed and is non-contributory to his present illness.     His  medications and allergies were reviewed.     Social history:   His social history has been reviewed and he has never smoked.     Review of symptoms:   His review of symptoms was reviewed and is significant for difficulty sleeping and negative for recent weight loss, fatigue, chills, night sweats, blood in stool or urine, recent infection, chest pain, visual disturbance, or shortness of breath.     Physical examination:   [name] most recent vitals:  Vitals  BP: 130/65  Pulse: 78  Height: 5\' 7"  (170.2 cm)  Weight: 208 lb (94.3 kg)  Body mass index is 32.58 kg/(m^2).    He is  well-developed and well-nourished, is in obvious pain and alert and oriented to person, place, and time. He demonstrates appropriate mood and affect.     He walks with a normal gait. His cervical flexion, extension, and axial rotation are moderately reduced with pain. His skin is warm and dry. His has no tenderness over his cervical spine and no obvious muscle spasm. The skin over his cervical spine is normal without surgical scar. He has 5/5 strength of his interosseous muscles, wrist dorsiflexors and volarflexors, biceps, triceps, deltoids, and internal and external rotators of his shoulders, bilaterally. His biceps, triceps, bracheoradialis, quadriceps and achilles reflexes are 1+, bilaterally. Sensation is intact to light touchfrom C6 to C8. He has no clonus and negative Hoffman's bilaterally.     Imaging:   I reviewed MRI images of his cervical spine. They show multilevel spondylosis with spontaneous fusion at C5-6. Severe bilateral foraminal narrowing at C6-7.    Assessment:   Cervical spondylosis.     Plan:   We discussed treatment options including observation, epidural injections, physical therapy and anterior cervical discectomy and fusion.    His symptoms are improved at this time. He will continue with observation.

## 2016-01-31 ENCOUNTER — Other Ambulatory Visit: Admit: 2016-01-31 | Discharge: 2016-01-31 | Payer: Medicare (Managed Care)

## 2016-01-31 DIAGNOSIS — Z79899 Other long term (current) drug therapy: Secondary | ICD-10-CM

## 2016-01-31 LAB — SED RATE: Sed Rate: 15 mm/h (ref 0–20)

## 2016-01-31 LAB — CBC
Hematocrit: 38.5 % (ref 38.5–50.0)
Hemoglobin: 12.8 g/dL (ref 13.2–17.1)
MCH: 31.2 pg (ref 27.0–33.0)
MCHC: 33.1 g/dL (ref 32.0–36.0)
MCV: 94.1 fL (ref 80.0–100.0)
MPV: 7.1 fL (ref 7.5–11.5)
Platelets: 228 10*3/uL (ref 140–400)
RBC: 4.1 10*6/uL (ref 4.20–5.80)
RDW: 15.6 % (ref 11.0–15.0)
WBC: 5.2 10*3/uL (ref 3.8–10.8)

## 2016-01-31 LAB — HEPATIC FUNCTION PANEL
ALT: 35 U/L (ref 7–52)
AST: 37 U/L (ref 13–39)
Albumin: 3.8 g/dL (ref 3.5–5.7)
Alkaline Phosphatase: 127 U/L — ABNORMAL HIGH (ref 36–125)
Bilirubin, Direct: 0.15 mg/dL (ref 0.00–0.40)
Bilirubin, Indirect: 0.45 mg/dL (ref 0.00–1.10)
Total Bilirubin: 0.6 mg/dL (ref 0.0–1.5)
Total Protein: 6.5 g/dL (ref 6.4–8.9)

## 2016-01-31 LAB — CREATININE, SERUM
Creatinine: 1.32 mg/dL — ABNORMAL HIGH (ref 0.60–1.30)
eGFR AA CKD-EPI: 62 See note.
eGFR NONAA CKD-EPI: 54 See note.

## 2016-02-20 ENCOUNTER — Other Ambulatory Visit: Admit: 2016-02-20 | Discharge: 2016-02-20 | Payer: Medicare (Managed Care)

## 2016-02-20 DIAGNOSIS — Z79899 Other long term (current) drug therapy: Secondary | ICD-10-CM

## 2016-02-20 LAB — SED RATE: Sed Rate: 23 mm/hr (ref 0–20)

## 2016-02-20 LAB — HEPATIC FUNCTION PANEL
ALT: 21 U/L (ref 7–52)
AST: 26 U/L (ref 13–39)
Albumin: 3.9 g/dL (ref 3.5–5.7)
Alkaline Phosphatase: 137 U/L — ABNORMAL HIGH (ref 36–125)
Bilirubin, Direct: 0.2 mg/dL (ref 0.00–0.40)
Bilirubin, Indirect: 0.5 mg/dL (ref 0.00–1.10)
Total Bilirubin: 0.7 mg/dL (ref 0.0–1.5)
Total Protein: 6.6 g/dL (ref 6.4–8.9)

## 2016-02-20 LAB — CBC
Hematocrit: 38.7 % (ref 38.5–50.0)
Hemoglobin: 12.8 g/dL (ref 13.2–17.1)
MCH: 31.1 pg (ref 27.0–33.0)
MCHC: 33.2 g/dL (ref 32.0–36.0)
MCV: 93.5 fL (ref 80.0–100.0)
MPV: 6.7 fL (ref 7.5–11.5)
Platelets: 226 10*3/uL (ref 140–400)
RBC: 4.14 10*6/uL (ref 4.20–5.80)
RDW: 15.6 % (ref 11.0–15.0)
WBC: 6.7 10*3/uL (ref 3.8–10.8)

## 2016-02-20 LAB — CREATININE, SERUM
Creatinine: 1.35 mg/dL — ABNORMAL HIGH (ref 0.60–1.30)
eGFR AA CKD-EPI: 61 See note.
eGFR NONAA CKD-EPI: 52 See note.

## 2016-02-23 MED ORDER — methotrexate 2.5 MG tablet
2.5 | ORAL_TABLET | ORAL | 0.00 refills | 28.00000 days | Status: AC
Start: 2016-02-23 — End: 2016-04-04

## 2016-02-23 NOTE — Unmapped (Signed)
MEDICATION REQUESTED:         MOST CURRENT LAB( MEDICAL ASSISTANT SHOULD CHECK MEDICAL RECORDS OFFICE, [facility] AND CARE EVERYWHERE FOR LABS AND THEN NOTE WHERE THE LABS ARE LOCATED AND ANY ABNORMALITIES HERE IN THIS MESSAGE):       Specimen on 02/20/2016   Component Date Value Ref Range Status   ??? WBC 02/20/2016 6.7  3.8 - 10.8 10E3/uL Final   ??? RBC 02/20/2016 4.14* 4.20 - 5.80 10E6/uL Final   ??? Hemoglobin 02/20/2016 12.8* 13.2 - 17.1 g/dL Final   ??? Hematocrit 02/20/2016 38.7  38.5 - 50.0 % Final   ??? MCV 02/20/2016 93.5  80.0 - 100.0 fL Final   ??? MCH 02/20/2016 31.1  27.0 - 33.0 pg Final   ??? MCHC 02/20/2016 33.2  32.0 - 36.0 g/dL Final   ??? RDW 16/08/9603 15.6* 11.0 - 15.0 % Final   ??? Platelets 02/20/2016 226  140 - 400 10E3/uL Final   ??? MPV 02/20/2016 6.7* 7.5 - 11.5 fL Final   ??? Total Bilirubin 02/20/2016 0.7  0.0 - 1.5 mg/dL Final   ??? Bilirubin, Direct 02/20/2016 0.20  0.00 - 0.40 mg/dL Final   ??? AST 54/07/8118 26  13 - 39 U/L Final   ??? ALT 02/20/2016 21  7 - 52 U/L Final   ??? Alkaline Phosphatase 02/20/2016 137* 36 - 125 U/L Final   ??? Total Protein 02/20/2016 6.6  6.4 - 8.9 g/dL Final   ??? Albumin 14/78/2956 3.9  3.5 - 5.7 g/dL Final   ??? Bilirubin, Indirect 02/20/2016 0.50  0.00 - 1.10 mg/dL Final   ??? Creatinine 02/20/2016 1.35* 0.60 - 1.30 mg/dL Final   ??? eGFR AA CKD-EPI 02/20/2016 61   Final   ??? eGFR NONAA CKD-EPI 02/20/2016 52   Final   ??? Sed Rate 02/20/2016 23* 0 - 20 mm/hr Final   Specimen on 01/31/2016   Component Date Value Ref Range Status   ??? WBC 01/31/2016 5.2  3.8 - 10.8 10E3/uL Final   ??? RBC 01/31/2016 4.10* 4.20 - 5.80 10E6/uL Final   ??? Hemoglobin 01/31/2016 12.8* 13.2 - 17.1 g/dL Final   ??? Hematocrit 01/31/2016 38.5  38.5 - 50.0 % Final   ??? MCV 01/31/2016 94.1  80.0 - 100.0 fL Final   ??? MCH 01/31/2016 31.2  27.0 - 33.0 pg Final   ??? MCHC 01/31/2016 33.1  32.0 - 36.0 g/dL Final   ??? RDW 21/30/8657 15.6* 11.0 - 15.0 % Final   ??? Platelets 01/31/2016 228  140 - 400 10E3/uL Final   ??? MPV 01/31/2016 7.1*  7.5 - 11.5 fL Final   ??? Total Bilirubin 01/31/2016 0.6  0.0 - 1.5 mg/dL Final   ??? Bilirubin, Direct 01/31/2016 0.15  0.00 - 0.40 mg/dL Final   ??? AST 84/69/6295 37  13 - 39 U/L Final   ??? ALT 01/31/2016 35  7 - 52 U/L Final   ??? Alkaline Phosphatase 01/31/2016 127* 36 - 125 U/L Final   ??? Total Protein 01/31/2016 6.5  6.4 - 8.9 g/dL Final   ??? Albumin 28/41/3244 3.8  3.5 - 5.7 g/dL Final   ??? Bilirubin, Indirect 01/31/2016 0.45  0.00 - 1.10 mg/dL Final   ??? Creatinine 01/31/2016 1.32* 0.60 - 1.30 mg/dL Final   ??? eGFR AA CKD-EPI 01/31/2016 62   Final   ??? eGFR NONAA CKD-EPI 01/31/2016 54   Final   ??? Sed Rate 01/31/2016 15  0 - 20 mm/hr Final   Specimen on  12/26/2015   Component Date Value Ref Range Status   ??? WBC 12/26/2015 8.0  3.8 - 10.8 10E3/uL Final   ??? RBC 12/26/2015 4.12* 4.20 - 5.80 10E6/uL Final   ??? Hemoglobin 12/26/2015 13.0* 13.2 - 17.1 g/dL Final   ??? Hematocrit 12/26/2015 39.0  38.5 - 50.0 % Final   ??? MCV 12/26/2015 94.8  80.0 - 100.0 fL Final   ??? MCH 12/26/2015 31.6  27.0 - 33.0 pg Final   ??? MCHC 12/26/2015 33.4  32.0 - 36.0 g/dL Final   ??? RDW 78/46/9629 15.0  11.0 - 15.0 % Final   ??? Platelets 12/26/2015 227  140 - 400 10E3/uL Final   ??? MPV 12/26/2015 6.9* 7.5 - 11.5 fL Final   ??? Total Bilirubin 12/26/2015 1.0  0.0 - 1.5 mg/dL Final   ??? Bilirubin, Direct 12/26/2015 0.23  0.00 - 0.40 mg/dL Final   ??? AST 52/84/1324 26  13 - 39 U/L Final   ??? ALT 12/26/2015 19  7 - 52 U/L Final   ??? Alkaline Phosphatase 12/26/2015 95  36 - 125 U/L Final   ??? Total Protein 12/26/2015 6.6  6.4 - 8.9 g/dL Final   ??? Albumin 40/08/2724 3.9  3.5 - 5.7 g/dL Final   ??? Bilirubin, Indirect 12/26/2015 0.77  0.00 - 1.10 mg/dL Final   ??? Creatinine 12/26/2015 1.48* 0.60 - 1.30 mg/dL Final   ??? eGFR AA CKD-EPI 12/26/2015 54   Final   ??? eGFR NONAA CKD-EPI 12/26/2015 47   Final   ??? Sed Rate 12/26/2015 27* 0 - 20 mm/hr Final         LAST APPT:    Last office visit:  10/03/2015  Last procedure visit:  Visit date not found        ALLERGIES:       Allergies    Allergen Reactions   ??? Ace Inhibitors Other (See Comments)     hypotension       (MEDICAL ASSISTANT SHOULD CHECK [facility] AND DOCUMENT IN ALLERGY SECTION                                            AND MARK AS REVIEWED)

## 2016-02-23 NOTE — Unmapped (Signed)
Mild increase in one liver lab. Please ask him to decrease Arava 10mg . Will recheck labs in one month.     Please ask if RA is still controlled since lowering Methotrexate dose.

## 2016-02-23 NOTE — Unmapped (Signed)
RA is doing well on the lowered dose, he is still tired but other than that he is ok.      He will decrease Arava and recheck blood work in 1 month

## 2016-02-27 MED ORDER — leflunomide (ARAVA) 20 MG tablet
20 | ORAL_TABLET | ORAL | Status: AC
Start: 2016-02-27 — End: 2016-04-04

## 2016-02-27 NOTE — Unmapped (Signed)
MEDICATION REQUESTED:     arava     MOST CURRENT LAB( MEDICAL ASSISTANT SHOULD CHECK MEDICAL RECORDS OFFICE, [facility] AND [facility] EVERYWHERE FOR LABS AND THEN NOTE WHERE THE LABS ARE LOCATED AND ANY ABNORMALITIES HERE IN THIS MESSAGE):       Specimen on 02/20/2016   Component Date Value Ref Range Status   ??? WBC 02/20/2016 6.7  3.8 - 10.8 10E3/uL Final   ??? RBC 02/20/2016 4.14* 4.20 - 5.80 10E6/uL Final   ??? Hemoglobin 02/20/2016 12.8* 13.2 - 17.1 g/dL Final   ??? Hematocrit 02/20/2016 38.7  38.5 - 50.0 % Final   ??? MCV 02/20/2016 93.5  80.0 - 100.0 fL Final   ??? MCH 02/20/2016 31.1  27.0 - 33.0 pg Final   ??? MCHC 02/20/2016 33.2  32.0 - 36.0 g/dL Final   ??? RDW 16/08/9603 15.6* 11.0 - 15.0 % Final   ??? Platelets 02/20/2016 226  140 - 400 10E3/uL Final   ??? MPV 02/20/2016 6.7* 7.5 - 11.5 fL Final   ??? Total Bilirubin 02/20/2016 0.7  0.0 - 1.5 mg/dL Final   ??? Bilirubin, Direct 02/20/2016 0.20  0.00 - 0.40 mg/dL Final   ??? AST 54/07/8118 26  13 - 39 U/L Final   ??? ALT 02/20/2016 21  7 - 52 U/L Final   ??? Alkaline Phosphatase 02/20/2016 137* 36 - 125 U/L Final   ??? Total Protein 02/20/2016 6.6  6.4 - 8.9 g/dL Final   ??? Albumin 14/78/2956 3.9  3.5 - 5.7 g/dL Final   ??? Bilirubin, Indirect 02/20/2016 0.50  0.00 - 1.10 mg/dL Final   ??? Creatinine 02/20/2016 1.35* 0.60 - 1.30 mg/dL Final   ??? eGFR AA CKD-EPI 02/20/2016 61   Final   ??? eGFR NONAA CKD-EPI 02/20/2016 52   Final   ??? Sed Rate 02/20/2016 23* 0 - 20 mm/hr Final   Specimen on 01/31/2016   Component Date Value Ref Range Status   ??? WBC 01/31/2016 5.2  3.8 - 10.8 10E3/uL Final   ??? RBC 01/31/2016 4.10* 4.20 - 5.80 10E6/uL Final   ??? Hemoglobin 01/31/2016 12.8* 13.2 - 17.1 g/dL Final   ??? Hematocrit 01/31/2016 38.5  38.5 - 50.0 % Final   ??? MCV 01/31/2016 94.1  80.0 - 100.0 fL Final   ??? MCH 01/31/2016 31.2  27.0 - 33.0 pg Final   ??? MCHC 01/31/2016 33.1  32.0 - 36.0 g/dL Final   ??? RDW 21/30/8657 15.6* 11.0 - 15.0 % Final   ??? Platelets 01/31/2016 228  140 - 400 10E3/uL Final   ??? MPV 01/31/2016  7.1* 7.5 - 11.5 fL Final   ??? Total Bilirubin 01/31/2016 0.6  0.0 - 1.5 mg/dL Final   ??? Bilirubin, Direct 01/31/2016 0.15  0.00 - 0.40 mg/dL Final   ??? AST 84/69/6295 37  13 - 39 U/L Final   ??? ALT 01/31/2016 35  7 - 52 U/L Final   ??? Alkaline Phosphatase 01/31/2016 127* 36 - 125 U/L Final   ??? Total Protein 01/31/2016 6.5  6.4 - 8.9 g/dL Final   ??? Albumin 28/41/3244 3.8  3.5 - 5.7 g/dL Final   ??? Bilirubin, Indirect 01/31/2016 0.45  0.00 - 1.10 mg/dL Final   ??? Creatinine 01/31/2016 1.32* 0.60 - 1.30 mg/dL Final   ??? eGFR AA CKD-EPI 01/31/2016 62   Final   ??? eGFR NONAA CKD-EPI 01/31/2016 54   Final   ??? Sed Rate 01/31/2016 15  0 - 20 mm/hr Final   Specimen  on 12/26/2015   Component Date Value Ref Range Status   ??? WBC 12/26/2015 8.0  3.8 - 10.8 10E3/uL Final   ??? RBC 12/26/2015 4.12* 4.20 - 5.80 10E6/uL Final   ??? Hemoglobin 12/26/2015 13.0* 13.2 - 17.1 g/dL Final   ??? Hematocrit 12/26/2015 39.0  38.5 - 50.0 % Final   ??? MCV 12/26/2015 94.8  80.0 - 100.0 fL Final   ??? MCH 12/26/2015 31.6  27.0 - 33.0 pg Final   ??? MCHC 12/26/2015 33.4  32.0 - 36.0 g/dL Final   ??? RDW 66/04/3015 15.0  11.0 - 15.0 % Final   ??? Platelets 12/26/2015 227  140 - 400 10E3/uL Final   ??? MPV 12/26/2015 6.9* 7.5 - 11.5 fL Final   ??? Total Bilirubin 12/26/2015 1.0  0.0 - 1.5 mg/dL Final   ??? Bilirubin, Direct 12/26/2015 0.23  0.00 - 0.40 mg/dL Final   ??? AST 12/04/3233 26  13 - 39 U/L Final   ??? ALT 12/26/2015 19  7 - 52 U/L Final   ??? Alkaline Phosphatase 12/26/2015 95  36 - 125 U/L Final   ??? Total Protein 12/26/2015 6.6  6.4 - 8.9 g/dL Final   ??? Albumin 57/32/2025 3.9  3.5 - 5.7 g/dL Final   ??? Bilirubin, Indirect 12/26/2015 0.77  0.00 - 1.10 mg/dL Final   ??? Creatinine 12/26/2015 1.48* 0.60 - 1.30 mg/dL Final   ??? eGFR AA CKD-EPI 12/26/2015 54   Final   ??? eGFR NONAA CKD-EPI 12/26/2015 47   Final   ??? Sed Rate 12/26/2015 27* 0 - 20 mm/hr Final         LAST APPT:    Last office visit:  10/03/2015  Last procedure visit:  Visit date not found        ALLERGIES:        Allergies   Allergen Reactions   ??? Ace Inhibitors Other (See Comments)     hypotension       (MEDICAL ASSISTANT SHOULD CHECK [facility] AND DOCUMENT IN ALLERGY SECTION                                            AND MARK AS REVIEWED)

## 2016-03-27 ENCOUNTER — Other Ambulatory Visit: Admit: 2016-03-27 | Payer: Medicare (Managed Care)

## 2016-03-27 DIAGNOSIS — Z79899 Other long term (current) drug therapy: Secondary | ICD-10-CM

## 2016-03-27 LAB — CBC
Hematocrit: 38.7 % (ref 38.5–50.0)
Hemoglobin: 12.9 g/dL (ref 13.2–17.1)
MCH: 30.8 pg (ref 27.0–33.0)
MCHC: 33.3 g/dL (ref 32.0–36.0)
MCV: 92.5 fL (ref 80.0–100.0)
MPV: 7 fL (ref 7.5–11.5)
Platelets: 219 10*3/uL (ref 140–400)
RBC: 4.19 10*6/uL (ref 4.20–5.80)
RDW: 16.1 % (ref 11.0–15.0)
WBC: 6 10*3/uL (ref 3.8–10.8)

## 2016-03-27 LAB — CREATININE, SERUM
Creatinine: 1.52 mg/dL (ref 0.60–1.30)
eGFR AA CKD-EPI: 53 See note.
eGFR NONAA CKD-EPI: 45 See note.

## 2016-03-27 LAB — HEPATIC FUNCTION PANEL
ALT: 24 U/L (ref 7–52)
AST: 30 U/L (ref 13–39)
Albumin: 4 g/dL (ref 3.5–5.7)
Alkaline Phosphatase: 120 U/L (ref 36–125)
Bilirubin, Direct: 0.19 mg/dL (ref 0.00–0.40)
Bilirubin, Indirect: 0.71 mg/dL (ref 0.00–1.10)
Total Bilirubin: 0.9 mg/dL (ref 0.0–1.5)
Total Protein: 6.6 g/dL (ref 6.4–8.9)

## 2016-03-27 LAB — SED RATE: Sed Rate: 33 mm/hr (ref 0–20)

## 2016-03-29 NOTE — Unmapped (Signed)
RHEUMATOLOGY FOLLOW UP PATIENT VISIT    04/04/2016    CHIEF COMPLAINT  Chief Complaint   Patient presents with   ??? 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    Interim history:  Last visit arthritis doing well but remained fatigued. He was starting CPAP for OSA. Elevated LFTs so asked him to wean MTX and Arava and LFTs have improved. Fatigue improved significantly after starting CPAP. RA still doing well on lower doses of immunosuppression. Only with ~10-30mins AM stiffness.    REVIEW OF SYSTEMS  See HPI  The patient has been experiencing : eye dryness or irritation       I have discussed with the patient the ROS information as documented in this progress note and confirm its accuracy and completeness       RAPID 3 SCORE  Rapid3 Score 04/04/2016 10/03/2015 04/20/2015   Rapid3 Cumulative Score (0-30) 3.5 3.5 5.5       PAST Medical History    Past Medical History   Diagnosis Date   ??? Unspecified inflammatory polyarthropathy    ??? Rheumatoid arthritis(714.0)        MEDICATIONS  Current Outpatient Prescriptions   Medication Sig Dispense Refill   ??? aspirin 81 MG EC tablet Take 81 mg by mouth daily.     ??? fish oil-omega-3 fatty acids 300-1,000 mg capsule Take 2 g by mouth daily.     ??? folic acid (FOLVITE) 1 MG tablet Take 1 tablet (1 mg total) by mouth daily. TAKE ONE TABLET BY MOUTH EVERY DAY 30 tablet 5   ??? INSULIN LISPRO (HUMALOG SUBQ) Inject subcutaneously.     ??? leflunomide (ARAVA) 20 MG tablet TAKE ONE TABLET BY MOUTH DAILY 30 tablet 2   ??? methotrexate 2.5 MG tablet Take 4 tablets (10 mg total) by mouth Every Wednesday. Indications: Rheumatoid Arthritis 16 tablet 0   ??? multivitamin (MULTIVITAMIN) tablet Take 1 tablet by mouth daily.     ??? omeprazole (PRILOSEC) 10 MG capsule Take 10 mg by mouth every morning before breakfast.     ??? RESTASIS 0.05 % ophthalmic emulsion        No current facility-administered medications for this visit.         PHYSICAL EXAM    Filed Vitals:    04/04/16 1533   BP: 119/64   Pulse: 79   Height: 5' 7 (1.702 m)   Weight: 208 lb (94.348 kg)     Constitutional:  Well developed, well nourished, no acute distress  Eyes:  PERRL, extra ocular movements intact, conjunctiva normal   Musculoskeletal: No synovitis, multiple heberden's nodes.  Integument:  Well hydrated, no rash or telangiectasias  Lymphatic:  No lymphadenopathy noted   Neurologic:   Alert & oriented x 3. Sensations Intact. Muscle strength 5/5 proximally and distally in upper and lower extremities.   Psychiatric:  Speech and behavior appropriate       LABS  Lab Results   Component Value Date    WBC 6.0 03/27/2016    HGB 12.9* 03/27/2016    HCT 38.7 03/27/2016    MCV 92.5 03/27/2016    PLT 219 03/27/2016      Lab Results   Component Value Date    CREATININE 1.52* 03/27/2016      Lab Results   Component Value Date    ALT 24 03/27/2016    AST 30 03/27/2016  ALKPHOS 120 03/27/2016    BILITOT 0.9 03/27/2016      No results found for: VITD25H   No results found for: CRP   Lab Results   Component Value Date    ESR 33* 03/27/2016      RF 201  CCP >250    IMAGING  Hands (09/2012)  Right:  1. Severe joint space narrowing and erosions involving the first MCP joint and fourth and fifth PIP joints. Differential diagnosis includes psoriatic arthritis, reactive arthritis, rheumatoid arthritis, or erosive osteoarthritis. Psoriatic arthritis is favored over rheumatoid arthritis due to the symmetry with the right hand.  2. Multiple erosions are also seen in the carpus, some of which demonstrate well-defined sclerotic margins. This appearance can be seen with amyloidosis. There is diffuse small vessel atherosclerosis which can be seen with chronic renal insufficiency which predisposes to amyloidosis.    Left:  1. Erosive arthritis primarily involving the 1st MCP and 3rd PIP joint. Asymmetry with the left hand is atypical of rheumatoid arthritis. Differential diagnosis includes psoriatic arthritis,  rheumatoid arthritis, or reactive arthritis.  2. Small erosions are seen in the carpus. Findings could be related to erosive arthropathy with amyloidosis not completely excluded.    Hands (11/2014)  There is slight irregularity of the ulnar styloid tip with moderate overlying soft tissue swelling. There are marked erosive changes and advanced joint space narrowing at the 1st MCP joint. There is small erosion at the 1st IP and possible tiny erosions in the 2nd and 3rd MCP joints. There is marked erosive changes at the 2nd PIP joint with advanced joint space narrowing. Minimal soft tissue swelling overlying the dorsal aspect of the MCP joint.There is mild narrowing of the third through fifth DIP joints with mild proliferative changes which may relate to mild osteoarthrosis. Vascular calcifications.  ??  Left hand: ??  Mild soft tissue swelling is present along the distal ulna. There are multifocal lucencies of the carpal bones more so at capitate, hamate, scaphotrapezial joint which are likely erosive changes. There is narrowing of the capitate hamate joint.  ??      There are erosions at the 1st MCP joint with mild joint space narrowing. There is marked destructive erosive changes with advanced narrowing at the 2nd MCP joint. Minimal erosive changes in 3-5th MCP joints without significant joint space narrowing. Marked erosive changes and joint space narrowing is present at the 4th and 5th PIP joints. On the lateral view there is soft tissue swelling along the dorsal MCP joints. These findings can relate to the provided history of inflammatory arthritis. Vascular calcifications.  ??    Assessment  Assessment and plan  [name] is a 70 y.o. male with      Fatigue: improved with CPAP.    Seropositive erosive RA: APRs have remained somewhat elevated despite good clinical control of diseaes.   - Arava 10mg  qD  - MTX 10mg     Myofascial Pain: somewhat new onset seems c/w FMS. Mild.  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 H/o retinopathy s/p laser cautery left eye.     OSA:    Therapeutic drug monitoring:   - safety labs and APRs q64mo  Lanice Schwab M.D.  [facility]  [address]  [place], [place]  [address]  [phone]

## 2016-04-04 ENCOUNTER — Ambulatory Visit: Admit: 2016-04-04 | Discharge: 2016-04-04 | Payer: Medicare (Managed Care) | Attending: Rheumatology

## 2016-04-04 DIAGNOSIS — M05719 Rheumatoid arthritis with rheumatoid factor of unspecified shoulder without organ or systems involvement: Secondary | ICD-10-CM

## 2016-04-04 MED ORDER — methotrexate 2.5 MG tablet
2.5 | ORAL_TABLET | ORAL | 0.00 refills | 28.00000 days | Status: AC
Start: 2016-04-04 — End: 2016-05-30

## 2016-04-04 MED ORDER — leflunomide (ARAVA) 10 MG tablet
10 | ORAL_TABLET | Freq: Every day | ORAL | Status: AC
Start: 2016-04-04 — End: 2016-10-03

## 2016-05-22 ENCOUNTER — Other Ambulatory Visit: Admit: 2016-05-22 | Discharge: 2016-05-22 | Payer: Medicare (Managed Care)

## 2016-05-22 DIAGNOSIS — Z79899 Other long term (current) drug therapy: Secondary | ICD-10-CM

## 2016-05-22 LAB — CBC
Hematocrit: 37.6 % — ABNORMAL LOW (ref 38.5–50.0)
Hemoglobin: 12.3 g/dL — ABNORMAL LOW (ref 13.2–17.1)
MCH: 31 pg (ref 27.0–33.0)
MCHC: 32.7 g/dL (ref 32.0–36.0)
MCV: 94.9 fL (ref 80.0–100.0)
MPV: 7.4 fL — ABNORMAL LOW (ref 7.5–11.5)
Platelets: 155 10E3/uL (ref 140–400)
RBC: 3.96 10E6/uL — ABNORMAL LOW (ref 4.20–5.80)
RDW: 16.2 % — ABNORMAL HIGH (ref 11.0–15.0)
WBC: 4.3 10E3/uL (ref 3.8–10.8)

## 2016-05-22 LAB — CREATININE, SERUM
Creatinine: 1.24 mg/dL (ref 0.60–1.30)
eGFR AA CKD-EPI: 67 See note.
eGFR NONAA CKD-EPI: 58 See note.

## 2016-05-22 LAB — HEPATIC FUNCTION PANEL
ALT: 23 U/L (ref 7–52)
AST: 30 U/L (ref 13–39)
Albumin: 3.6 g/dL (ref 3.5–5.7)
Alkaline Phosphatase: 143 U/L (ref 36–125)
Bilirubin, Direct: 0.23 mg/dL (ref 0.00–0.40)
Bilirubin, Indirect: 0.67 mg/dL (ref 0.00–1.10)
Total Bilirubin: 0.9 mg/dL (ref 0.0–1.5)
Total Protein: 6.3 g/dL (ref 6.4–8.9)

## 2016-05-22 LAB — SED RATE: Sed Rate: 31 mm/hr (ref 0–20)

## 2016-05-23 NOTE — Unmapped (Signed)
Mild abnormlaity on LFTs could represent active arthritis. Are his symptoms controlled? If so, we'll just continue to moniter LFTs at next f/u lab check.

## 2016-05-31 MED ORDER — methotrexate 2.5 MG tablet
2.5 | ORAL_TABLET | ORAL | 0.00 refills | 28.00000 days | Status: AC
Start: 2016-05-31 — End: 2016-07-03

## 2016-05-31 NOTE — Unmapped (Signed)
LM for pt to call back     Saw this message checking to see if Dr Waldo Laine forwarded her message re: labs and rx to Dr Marcille Blanco. This message was never forwarded to pool

## 2016-05-31 NOTE — Unmapped (Signed)
Noted. Dr Marcille Blanco had a message in pts chart re: labs from 6/28 but it was never forwarded to pool. See other message LM for pt to call back

## 2016-05-31 NOTE — Unmapped (Signed)
I will refill his methotrexate but want Dr. Marcille Blanco to be aware I did this with a slight elevation in his alkaline phosphotase

## 2016-05-31 NOTE — Unmapped (Signed)
Patient has been notified of results

## 2016-05-31 NOTE — Unmapped (Signed)
MEDICATION REQUESTED:   Methotrexate       MOST CURRENT LAB( MEDICAL ASSISTANT SHOULD CHECK MEDICAL RECORDS OFFICE, [facility] AND [facility] EVERYWHERE FOR LABS AND THEN NOTE WHERE THE LABS ARE LOCATED AND ANY ABNORMALITIES HERE IN THIS MESSAGE):       Specimen on 05/22/2016   Component Date Value Ref Range Status   ??? WBC 05/22/2016 4.3  3.8 - 10.8 10E3/uL Final   ??? RBC 05/22/2016 3.96* 4.20 - 5.80 10E6/uL Final   ??? Hemoglobin 05/22/2016 12.3* 13.2 - 17.1 g/dL Final   ??? Hematocrit 05/22/2016 37.6* 38.5 - 50.0 % Final   ??? MCV 05/22/2016 94.9  80.0 - 100.0 fL Final   ??? MCH 05/22/2016 31.0  27.0 - 33.0 pg Final   ??? MCHC 05/22/2016 32.7  32.0 - 36.0 g/dL Final   ??? RDW 54/07/8118 16.2* 11.0 - 15.0 % Final   ??? Platelets 05/22/2016 155  140 - 400 10E3/uL Final   ??? MPV 05/22/2016 7.4* 7.5 - 11.5 fL Final   ??? Total Bilirubin 05/22/2016 0.9  0.0 - 1.5 mg/dL Final   ??? Bilirubin, Direct 05/22/2016 0.23  0.00 - 0.40 mg/dL Final   ??? AST 14/78/2956 30  13 - 39 U/L Final   ??? ALT 05/22/2016 23  7 - 52 U/L Final   ??? Alkaline Phosphatase 05/22/2016 143* 36 - 125 U/L Final   ??? Total Protein 05/22/2016 6.3* 6.4 - 8.9 g/dL Final   ??? Albumin 21/30/8657 3.6  3.5 - 5.7 g/dL Final   ??? Bilirubin, Indirect 05/22/2016 0.67  0.00 - 1.10 mg/dL Final   ??? Creatinine 05/22/2016 1.24  0.60 - 1.30 mg/dL Final   ??? eGFR AA CKD-EPI 05/22/2016 67   Final   ??? eGFR NONAA CKD-EPI 05/22/2016 58   Final   ??? Sed Rate 05/22/2016 31* 0 - 20 mm/hr Final   Specimen on 03/27/2016   Component Date Value Ref Range Status   ??? WBC 03/27/2016 6.0  3.8 - 10.8 10E3/uL Final   ??? RBC 03/27/2016 4.19* 4.20 - 5.80 10E6/uL Final   ??? Hemoglobin 03/27/2016 12.9* 13.2 - 17.1 g/dL Final   ??? Hematocrit 03/27/2016 38.7  38.5 - 50.0 % Final   ??? MCV 03/27/2016 92.5  80.0 - 100.0 fL Final   ??? MCH 03/27/2016 30.8  27.0 - 33.0 pg Final   ??? MCHC 03/27/2016 33.3  32.0 - 36.0 g/dL Final   ??? RDW 84/69/6295 16.1* 11.0 - 15.0 % Final   ??? Platelets 03/27/2016 219  140 - 400 10E3/uL Final   ??? MPV  03/27/2016 7.0* 7.5 - 11.5 fL Final   ??? Total Bilirubin 03/27/2016 0.9  0.0 - 1.5 mg/dL Final   ??? Bilirubin, Direct 03/27/2016 0.19  0.00 - 0.40 mg/dL Final   ??? AST 28/41/3244 30  13 - 39 U/L Final   ??? ALT 03/27/2016 24  7 - 52 U/L Final   ??? Alkaline Phosphatase 03/27/2016 120  36 - 125 U/L Final   ??? Total Protein 03/27/2016 6.6  6.4 - 8.9 g/dL Final   ??? Albumin 11/28/7251 4.0  3.5 - 5.7 g/dL Final   ??? Bilirubin, Indirect 03/27/2016 0.71  0.00 - 1.10 mg/dL Final   ??? Creatinine 03/27/2016 1.52* 0.60 - 1.30 mg/dL Final   ??? eGFR AA CKD-EPI 03/27/2016 53   Final   ??? eGFR NONAA CKD-EPI 03/27/2016 45   Final   ??? Sed Rate 03/27/2016 33* 0 - 20 mm/hr Final  LAST APPT:    Last office visit:  04/04/2016  Last procedure visit:  Visit date not found    QUALITY INDICATOR FLOWSHEET RESULTS:   No flowsheet data found.        ALLERGIES:       Allergies   Allergen Reactions   ??? Ace Inhibitors Other (See Comments)     hypotension       (MEDICAL ASSISTANT SHOULD CHECK [facility] EP[facility] AND DOCUMENT IN ALLERGY SECTION                                            AND MARK AS REVIEWED)

## 2016-06-04 MED ORDER — folic acid (FOLVITE) 1 MG tablet
1 | ORAL_TABLET | Freq: Every day | ORAL | 2.00 refills | 30.00000 days | Status: AC
Start: 2016-06-04 — End: ?

## 2016-06-04 NOTE — Unmapped (Signed)
Forwarding to Dr. Marcille Blanco.   OM

## 2016-06-04 NOTE — Unmapped (Signed)
MEDICATION REQUESTED:    Folic    No flowsheet data found.          LAST APPT:  Last office visit:  04/04/2016  Last procedure visit:  Visit date not found    QUALITY INDICATOR FLOWSHEET RESULTS:   No flowsheet data found.        ALLERGIES:   Allergies   Allergen Reactions   ??? Ace Inhibitors Other (See Comments)     hypotension

## 2016-07-02 ENCOUNTER — Other Ambulatory Visit: Admit: 2016-07-02 | Discharge: 2016-07-02 | Payer: Medicare (Managed Care)

## 2016-07-02 DIAGNOSIS — Z79899 Other long term (current) drug therapy: Secondary | ICD-10-CM

## 2016-07-02 LAB — CREATININE, SERUM
Creatinine: 1.14 mg/dL (ref 0.60–1.30)
eGFR AA CKD-EPI: 74 See note.
eGFR NONAA CKD-EPI: 64 See note.

## 2016-07-02 LAB — CBC
Hematocrit: 39.8 % (ref 38.5–50.0)
Hemoglobin: 13.4 g/dL (ref 13.2–17.1)
MCH: 32 pg (ref 27.0–33.0)
MCHC: 33.8 g/dL (ref 32.0–36.0)
MCV: 94.7 fL (ref 80.0–100.0)
MPV: 7.7 fL (ref 7.5–11.5)
Platelets: 170 10*3/uL (ref 140–400)
RBC: 4.2 10*6/uL (ref 4.20–5.80)
RDW: 16 % (ref 11.0–15.0)
WBC: 4.9 10*3/uL (ref 3.8–10.8)

## 2016-07-02 LAB — HEPATIC FUNCTION PANEL
ALT: 19 U/L (ref 7–52)
AST: 25 U/L (ref 13–39)
Albumin: 3.7 g/dL (ref 3.5–5.7)
Alkaline Phosphatase: 143 U/L (ref 36–125)
Bilirubin, Direct: 0.17 mg/dL (ref 0.00–0.40)
Bilirubin, Indirect: 0.53 mg/dL (ref 0.00–1.10)
Total Bilirubin: 0.7 mg/dL (ref 0.0–1.5)
Total Protein: 6.2 g/dL (ref 6.4–8.9)

## 2016-07-02 LAB — PSA TOTAL, SCREENING: PSA: 2.6 ng/mL (ref 0.0–4.0)

## 2016-07-02 LAB — SED RATE: Sed Rate: 34 mm/hr (ref 0–20)

## 2016-07-02 NOTE — Unmapped (Signed)
Still with mild elevation in LFTs. Please ask him to hold Arava. Will recheck LFTs in 1 month. If arthritis flares we can use low dose Pred. He can call if feels return of symptoms.

## 2016-07-03 MED ORDER — methotrexate 2.5 MG tablet
2.5 | ORAL_TABLET | ORAL | 0.00 refills | 28.00000 days | Status: AC
Start: 2016-07-03 — End: 2016-09-13

## 2016-07-03 NOTE — Unmapped (Signed)
LM for pt to call back

## 2016-07-03 NOTE — Unmapped (Signed)
Pt made aware of results & MDs instructions. Pt verbalized understanding. Pt requesting MTX refill. Refill request forwarded to MD.

## 2016-08-03 ENCOUNTER — Ambulatory Visit: Admit: 2016-08-03 | Payer: Medicare (Managed Care)

## 2016-08-03 DIAGNOSIS — M064 Inflammatory polyarthropathy: Secondary | ICD-10-CM

## 2016-08-03 LAB — HEPATIC FUNCTION PANEL, SERUM
ALT: 17 U/L (ref 7–52)
AST (SGOT): 26 U/L (ref 13–39)
Albumin: 3.7 g/dL (ref 3.5–5.7)
Alkaline Phosphatase: 116 U/L (ref 36–125)
Bilirubin, Direct: 0.14 mg/dL (ref 0.00–0.40)
Bilirubin, Indirect: 0.56 mg/dL (ref 0.00–1.10)
Total Bilirubin: 0.7 mg/dL (ref 0.0–1.5)
Total Protein: 6.3 g/dL (ref 6.4–8.9)

## 2016-08-06 NOTE — Telephone Encounter (Signed)
Left message for pt to call

## 2016-08-06 NOTE — Telephone Encounter (Signed)
Recheck LFTs wnl.

## 2016-08-07 NOTE — Telephone Encounter (Signed)
Left message for pt to call

## 2016-08-08 NOTE — Telephone Encounter (Signed)
Unable to reach pt, letter mailed

## 2016-09-10 ENCOUNTER — Ambulatory Visit: Admit: 2016-09-10 | Payer: Medicare (Managed Care)

## 2016-09-10 DIAGNOSIS — Z79899 Other long term (current) drug therapy: Secondary | ICD-10-CM

## 2016-09-10 LAB — HEPATIC FUNCTION PANEL
ALT: 12 U/L (ref 7–52)
AST: 24 U/L (ref 13–39)
Albumin: 3.6 g/dL (ref 3.5–5.7)
Alkaline Phosphatase: 117 U/L (ref 36–125)
Bilirubin, Direct: 0.2 mg/dL (ref 0.00–0.40)
Bilirubin, Indirect: 0.9 mg/dL (ref 0.00–1.10)
Total Bilirubin: 1.1 mg/dL (ref 0.0–1.5)
Total Protein: 6.4 g/dL (ref 6.4–8.9)

## 2016-09-10 LAB — CBC
Hematocrit: 39.3 % (ref 38.5–50.0)
Hemoglobin: 13.3 g/dL (ref 13.2–17.1)
MCH: 31.1 pg (ref 27.0–33.0)
MCHC: 33.9 g/dL (ref 32.0–36.0)
MCV: 92 fL (ref 80.0–100.0)
MPV: 7.6 fL (ref 7.5–11.5)
Platelets: 144 10*3/uL (ref 140–400)
RBC: 4.27 10*6/uL (ref 4.20–5.80)
RDW: 16 % (ref 11.0–15.0)
WBC: 6.9 10*3/uL (ref 3.8–10.8)

## 2016-09-10 LAB — CREATININE, SERUM
Creatinine: 1.27 mg/dL (ref 0.60–1.30)
eGFR AA CKD-EPI: 65 See note.
eGFR NONAA CKD-EPI: 56 See note.

## 2016-09-10 LAB — SED RATE: Sed Rate: 33 mm/h — ABNORMAL HIGH (ref 0–20)

## 2016-09-13 MED ORDER — methotrexate 2.5 MG tablet
2.5 | ORAL_TABLET | ORAL | 2 refills | Status: AC
Start: 2016-09-13 — End: 2016-11-15

## 2016-09-13 NOTE — Telephone Encounter (Signed)
MEDICATION REQUESTED:   MTX       MOST CURRENT LAB( MEDICAL ASSISTANT SHOULD CHECK MEDICAL RECORDS OFFICE, [facility] AND CARE EVERYWHERE FOR LABS AND THEN NOTE WHERE THE LABS ARE LOCATED AND ANY ABNORMALITIES HERE IN THIS MESSAGE):       Appointment on 09/10/2016   Component Date Value Ref Range Status    WBC 09/10/2016 6.9  3.8 - 10.8 10E3/uL Final    RBC 09/10/2016 4.27  4.20 - 5.80 10E6/uL Final    Hemoglobin 09/10/2016 13.3  13.2 - 17.1 g/dL Final    Hematocrit 29/56/2130 39.3  38.5 - 50.0 % Final    MCV 09/10/2016 92.0  80.0 - 100.0 fL Final    MCH 09/10/2016 31.1  27.0 - 33.0 pg Final    MCHC 09/10/2016 33.9  32.0 - 36.0 g/dL Final    RDW 86/57/8469 16.0* 11.0 - 15.0 % Final    Platelets 09/10/2016 144  140 - 400 10E3/uL Final    MPV 09/10/2016 7.6  7.5 - 11.5 fL Final    Total Bilirubin 09/10/2016 1.1  0.0 - 1.5 mg/dL Final    Bilirubin, Direct 09/10/2016 0.20  0.00 - 0.40 mg/dL Final    AST 62/95/2841 24  13 - 39 U/L Final    ALT 09/10/2016 12  7 - 52 U/L Final    Alkaline Phosphatase 09/10/2016 117  36 - 125 U/L Final    Total Protein 09/10/2016 6.4  6.4 - 8.9 g/dL Final    Albumin 32/44/0102 3.6  3.5 - 5.7 g/dL Final    Bilirubin, Indirect 09/10/2016 0.90  0.00 - 1.10 mg/dL Final    Creatinine 72/53/6644 1.27  0.60 - 1.30 mg/dL Final    eGFR AA CKD-EPI 09/10/2016 65  See note. Final    eGFR NONAA CKD-EPI 09/10/2016 56  See note. Final    Sed Rate 09/10/2016 33* 0 - 20 mm/hr Final   Appointment on 08/03/2016   Component Date Value Ref Range Status    Total Bilirubin 08/03/2016 0.7  0.0 - 1.5 mg/dL Final    Bilirubin, Direct 08/03/2016 0.14  0.00 - 0.40 mg/dL Final    AST (SGOT) 03/47/4259 26  13 - 39 U/L Final    ALT 08/03/2016 17  7 - 52 U/L Final    Alkaline Phosphatase 08/03/2016 116  36 - 125 U/L Final    Total Protein 08/03/2016 6.3* 6.4 - 8.9 g/dL Final    Albumin 56/38/7564 3.7  3.5 - 5.7 g/dL Final    Bilirubin, Indirect 08/03/2016 0.56  0.00 - 1.10 mg/dL Final    Specimen on 33/29/5188   Component Date Value Ref Range Status    WBC 07/02/2016 4.9  3.8 - 10.8 10E3/uL Final    RBC 07/02/2016 4.20  4.20 - 5.80 10E6/uL Final    Hemoglobin 07/02/2016 13.4  13.2 - 17.1 g/dL Final    Hematocrit 41/66/0630 39.8  38.5 - 50.0 % Final    MCV 07/02/2016 94.7  80.0 - 100.0 fL Final    MCH 07/02/2016 32.0  27.0 - 33.0 pg Final    MCHC 07/02/2016 33.8  32.0 - 36.0 g/dL Final    RDW 16/11/930 16.0* 11.0 - 15.0 % Final    Platelets 07/02/2016 170  140 - 400 10E3/uL Final    MPV 07/02/2016 7.7  7.5 - 11.5 fL Final    Total Bilirubin 07/02/2016 0.7  0.0 - 1.5 mg/dL Final    Bilirubin, Direct 07/02/2016 0.17  0.00 - 0.40 mg/dL  Final    AST 07/02/2016 25  13 - 39 U/L Final    ALT 07/02/2016 19  7 - 52 U/L Final    Alkaline Phosphatase 07/02/2016 143* 36 - 125 U/L Final    Total Protein 07/02/2016 6.2* 6.4 - 8.9 g/dL Final    Albumin 09/81/1914 3.7  3.5 - 5.7 g/dL Final    Bilirubin, Indirect 07/02/2016 0.53  0.00 - 1.10 mg/dL Final    Creatinine 78/29/5621 1.14  0.60 - 1.30 mg/dL Final    eGFR AA CKD-EPI 07/02/2016 74  See note. Final    eGFR NONAA CKD-EPI 07/02/2016 64  See note. Final    Sed Rate 07/02/2016 34* 0 - 20 mm/hr Final    PSA 07/02/2016 2.60  0.0 - 4.0 ng/mL Final         LAST APPT:    Last office visit:  04/04/2016  Last procedure visit:  Visit date not found    QUALITY INDICATOR FLOWSHEET RESULTS:   No flowsheet data found.        ALLERGIES:       Allergies   Allergen Reactions    Ace Inhibitors Other (See Comments)     hypotension       (MEDICAL ASSISTANT SHOULD CHECK [facility] AND DOCUMENT IN ALLERGY SECTION                                            AND MARK AS REVIEWED)

## 2016-09-18 NOTE — Telephone Encounter (Signed)
Submitted PA online through [email or web address] for PA on Methotrexate 2.5mg     Pending outcome.

## 2016-09-18 NOTE — Telephone Encounter (Signed)
Attempted to contact pt to f/u on VM Message re: requesting MTX PA status update. No answer, LM to return call.

## 2016-09-18 NOTE — Telephone Encounter (Signed)
Date created: 09/18/16  Drug: MEHTOTREXATE 2.5mg   Insurance: [employer]

## 2016-09-19 NOTE — Telephone Encounter (Signed)
[employer] denied PA request for Methotrexate because they do not cover outpatient medications.     Sent fax to pharmacy with this information- I think they are running the claim under the wrong insurance (Part B) and need to run it either under Part D or contact the patient for other pharmacy coverage. Pharmacy will contact patient or [place] if they need anything else.

## 2016-09-19 NOTE — Telephone Encounter (Signed)
Attempted to give pt PA update. No answer, LM to return call.

## 2016-09-20 NOTE — Telephone Encounter (Signed)
Patient has been notified, he will contact the pharmacy to see if they have solved this problem

## 2016-10-03 ENCOUNTER — Ambulatory Visit: Admit: 2016-10-03 | Discharge: 2016-10-03 | Payer: Medicare (Managed Care) | Attending: Rheumatology

## 2016-10-03 DIAGNOSIS — M05719 Rheumatoid arthritis with rheumatoid factor of unspecified shoulder without organ or systems involvement: Secondary | ICD-10-CM

## 2016-10-03 MED ORDER — predniSONE (DELTASONE) 10 MG tablet
10 | ORAL_TABLET | Freq: Two times a day (BID) | ORAL | 0 refills | Status: AC
Start: 2016-10-03 — End: 2016-10-13

## 2016-10-03 NOTE — Unmapped (Signed)
RHEUMATOLOGY FOLLOW UP PATIENT VISIT    10/03/2016    CHIEF COMPLAINT  Chief Complaint   Patient presents with   ??? 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    Interim history:  Last visit arthritis doing well , only with ~10-16mins AM stiffness. Last SED elevated. He is moving to [place], [place] in a couple of weeks. He has noted a lot more more fatigue and hand pain for the last 6 weeks. Previously was doing very well for last several months since stopping Arava.    REVIEW OF SYSTEMS  See HPI  The patient has been experiencing : fatigue       I have discussed with the patient the ROS information as documented in this progress note and confirm its accuracy and completeness       RAPID 3 SCORE  Rapid3 Score 10/03/2016 04/04/2016 10/03/2015   Rapid3 Cumulative Score (0-30) 4.7 3.5 3.5       PAST Medical History    Past Medical History:   Diagnosis Date   ??? Rheumatoid arthritis(714.0)    ??? Unspecified inflammatory polyarthropathy        MEDICATIONS  Current Outpatient Prescriptions   Medication Sig Dispense Refill   ??? aspirin 81 MG EC tablet Take 81 mg by mouth daily.     ??? fish oil-omega-3 fatty acids 300-1,000 mg capsule Take 2 g by mouth daily.     ??? folic acid (FOLVITE) 1 MG tablet Take 1 tablet (1 mg total) by mouth daily. TAKE ONE TABLET BY MOUTH EVERY DAY 30 tablet 5   ??? INSULIN LISPRO (HUMALOG SUBQ) Inject subcutaneously.     ??? methotrexate 2.5 MG tablet TAKE FOUR TABLETS BY MOUTH EVERY WEDNESDAY 16 tablet 2   ??? multivitamin (MULTIVITAMIN) tablet Take 1 tablet by mouth daily.     ??? omeprazole (PRILOSEC) 10 MG capsule Take 10 mg by mouth every morning before breakfast.     ??? RESTASIS 0.05 % ophthalmic emulsion        No current facility-administered medications for this visit.          PHYSICAL EXAM   Vitals:    10/03/16 1530   BP: 118/47   Pulse: 76   Weight: 209 lb 6.4 oz (95 kg)   Height: 5' 7.5 (1.715 m)     Constitutional:  Well developed, well  nourished, no acute distress  Eyes:  PERRL, extra ocular movements intact, conjunctiva normal   Musculoskeletal: No synovitis, multiple heberden's nodes.  Integument:  Well hydrated, no rash or telangiectasias  Lymphatic:  No lymphadenopathy noted   Neurologic:   Alert & oriented x 3. Sensations Intact. Muscle strength 5/5 proximally and distally in upper and lower extremities.   Psychiatric:  Speech and behavior appropriate       LABS  Lab Results   Component Value Date    WBC 6.9 09/10/2016    HGB 13.3 09/10/2016    HCT 39.3 09/10/2016    MCV 92.0 09/10/2016    PLT 144 09/10/2016      Lab Results   Component Value Date    CREATININE 1.27 09/10/2016      Lab Results   Component Value Date    ALT 12 09/10/2016    AST 24 09/10/2016    ALKPHOS 117 09/10/2016    BILITOT 1.1 09/10/2016      No results found for:  VITD25H   No results found for: CRP   Lab Results   Component Value Date    ESR 33 (H) 09/10/2016      RF 201  CCP >250    IMAGING  Hands (09/2012)  Right:  1. Severe joint space narrowing and erosions involving the first MCP joint and fourth and fifth PIP joints. Differential diagnosis includes psoriatic arthritis, reactive arthritis, rheumatoid arthritis, or erosive osteoarthritis. Psoriatic arthritis is favored over rheumatoid arthritis due to the symmetry with the right hand.  2. Multiple erosions are also seen in the carpus, some of which demonstrate well-defined sclerotic margins. This appearance can be seen with amyloidosis. There is diffuse small vessel atherosclerosis which can be seen with chronic renal insufficiency which predisposes to amyloidosis.    Left:  1. Erosive arthritis primarily involving the 1st MCP and 3rd PIP joint. Asymmetry with the left hand is atypical of rheumatoid arthritis. Differential diagnosis includes psoriatic arthritis, rheumatoid arthritis, or reactive arthritis.  2. Small erosions are seen in the carpus. Findings could be related to erosive arthropathy with amyloidosis not  completely excluded.    Hands (11/2014)  There is slight irregularity of the ulnar styloid tip with moderate overlying soft tissue swelling. There are marked erosive changes and advanced joint space narrowing at the 1st MCP joint. There is small erosion at the 1st IP and possible tiny erosions in the 2nd and 3rd MCP joints. There is marked erosive changes at the 2nd PIP joint with advanced joint space narrowing. Minimal soft tissue swelling overlying the dorsal aspect of the MCP joint.There is mild narrowing of the third through fifth DIP joints with mild proliferative changes which may relate to mild osteoarthrosis. Vascular calcifications.  ??  Left hand: ??  Mild soft tissue swelling is present along the distal ulna. There are multifocal lucencies of the carpal bones more so at capitate, hamate, scaphotrapezial joint which are likely erosive changes. There is narrowing of the capitate hamate joint.  ??      There are erosions at the 1st MCP joint with mild joint space narrowing. There is marked destructive erosive changes with advanced narrowing at the 2nd MCP joint. Minimal erosive changes in 3-5th MCP joints without significant joint space narrowing. Marked erosive changes and joint space narrowing is present at the 4th and 5th PIP joints. On the lateral view there is soft tissue swelling along the dorsal MCP joints. These findings can relate to the provided history of inflammatory arthritis. Vascular calcifications.  ??    Assessment   Assessment and plan  JOESIAH LONON is a 70 y.o. male with        Seropositive erosive RA: APRs have remained somewhat elevated despite good clinical control of diseaes.   - MTX 10mg  qW  - start Pred 20mg  x 10d        - if joints don't remain wall controlled after Pred will restart Arava    Myofascial Pain: somewhat new onset seems c/w FMS. Mild.  - moniter for now while changing RA meds    Fatigue: improved with CPAP.    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 H/o retinopathy s/p laser cautery left eye.     OSA:    Therapeutic drug monitoring:   -  safety labs and APRs q46mo    Lanice Schwab M.D.  [facility]  [address]  [place], [place]  [address]  [phone]

## 2016-10-31 ENCOUNTER — Emergency Department (HOSPITAL_COMMUNITY)
Admission: EM | Admit: 2016-10-31 | Discharge: 2016-10-31 | Disposition: A | Discharge status: Home / Self Care | Payer: Medicare Other | Attending: Emergency Medicine | Admitting: Emergency Medicine

## 2016-10-31 ENCOUNTER — Encounter (HOSPITAL_COMMUNITY): Payer: Self-pay | Admitting: Emergency Medicine

## 2016-10-31 ENCOUNTER — Emergency Department (HOSPITAL_COMMUNITY): Payer: Medicare Other

## 2016-10-31 DIAGNOSIS — L03115 Cellulitis of right lower limb: Secondary | ICD-10-CM | POA: Diagnosis not present

## 2016-10-31 DIAGNOSIS — L97309 Non-pressure chronic ulcer of unspecified ankle with unspecified severity: Secondary | ICD-10-CM

## 2016-10-31 DIAGNOSIS — L97319 Non-pressure chronic ulcer of right ankle with unspecified severity: Secondary | ICD-10-CM | POA: Insufficient documentation

## 2016-10-31 DIAGNOSIS — Z79899 Other long term (current) drug therapy: Secondary | ICD-10-CM | POA: Insufficient documentation

## 2016-10-31 DIAGNOSIS — M7989 Other specified soft tissue disorders: Secondary | ICD-10-CM | POA: Diagnosis present

## 2016-10-31 DIAGNOSIS — Z7982 Long term (current) use of aspirin: Secondary | ICD-10-CM | POA: Diagnosis not present

## 2016-10-31 DIAGNOSIS — E11622 Type 2 diabetes mellitus with other skin ulcer: Secondary | ICD-10-CM | POA: Diagnosis not present

## 2016-10-31 LAB — CBC WITH DIFFERENTIAL/PLATELET
BASOS ABS: 0 10*3/uL (ref 0.0–0.1)
Basophils Relative: 0 %
EOS PCT: 5 %
Eosinophils Absolute: 0.3 10*3/uL (ref 0.0–0.7)
HCT: 37.2 % — ABNORMAL LOW (ref 39.0–52.0)
Hemoglobin: 12.2 g/dL — ABNORMAL LOW (ref 13.0–17.0)
LYMPHS PCT: 17 %
Lymphs Abs: 0.9 10*3/uL (ref 0.7–4.0)
MCH: 30 pg (ref 26.0–34.0)
MCHC: 32.8 g/dL (ref 30.0–36.0)
MCV: 91.6 fL (ref 78.0–100.0)
MONO ABS: 0.6 10*3/uL (ref 0.1–1.0)
Monocytes Relative: 10 %
Neutro Abs: 3.7 10*3/uL (ref 1.7–7.7)
Neutrophils Relative %: 68 %
PLATELETS: 184 10*3/uL (ref 150–400)
RBC: 4.06 MIL/uL — ABNORMAL LOW (ref 4.22–5.81)
RDW: 15.1 % (ref 11.5–15.5)
WBC: 5.5 10*3/uL (ref 4.0–10.5)

## 2016-10-31 LAB — BASIC METABOLIC PANEL
ANION GAP: 5 (ref 5–15)
BUN: 29 mg/dL — AB (ref 6–20)
CALCIUM: 9.5 mg/dL (ref 8.9–10.3)
CO2: 29 mmol/L (ref 22–32)
CREATININE: 1.29 mg/dL — AB (ref 0.61–1.24)
Chloride: 104 mmol/L (ref 101–111)
GFR calc Af Amer: >60 mL/min (ref >60)
GFR, EST NON AFRICAN AMERICAN: 54 mL/min — AB (ref >60)
GLUCOSE: 170 mg/dL — AB (ref 65–99)
Potassium: 4.3 mmol/L (ref 3.5–5.1)
Sodium: 138 mmol/L (ref 135–145)

## 2016-10-31 LAB — SEDIMENTATION RATE: Sed Rate: 73 mm/hr — ABNORMAL HIGH (ref 0–16)

## 2016-10-31 MED ORDER — SULFAMETHOXAZOLE-TRIMETHOPRIM 800-160 MG PO TABS: 1.0000 | ORAL_TABLET | Freq: Two times a day (BID) | ORAL | 0 refills | Status: AC

## 2016-10-31 MED ORDER — AMOXICILLIN-POT CLAVULANATE 875-125 MG PO TABS: 1.0000 | ORAL_TABLET | Freq: Two times a day (BID) | ORAL | 0 refills | Status: AC

## 2016-10-31 NOTE — ED Triage Notes (Signed)
Chronic right foot pain, rates pain 4/10.  Has open area to right anterior ankle.  C/o swelling to right ankle.

## 2016-10-31 NOTE — Discharge Instructions (Signed)
Please take antibiotics as prescribed. Return without fail for worsening symptoms, including fever, worsening redness/swelling/pain, confusion, or any other symptoms concerning to you.  You are given follow-up for podiatry and orthopedic surgery above. Please see your PCP on Monday as scheduled.

## 2016-10-31 NOTE — ED Provider Notes (Signed)
[facility]-EM[facility]Y DEPT Provider Note   CSN: [id number] Arrival date & time: 10/31/16  1256     History   Chief Complaint Chief Complaint  Patient presents with  . Foot Pain    right    HPI Andrew Schultz is a 70 y.o. male.  HPI 70 year old male who presents with right foot pain and swelling. History of DM and RA on methotrexate. Recently moved from [place] a few weeks ago. States 6 weeks ago, while in [place], developed right ankle/calf swelling. Treated for cellulitis by his podiatrist with course of ciprofloxacin. Swelling improved, and wearing a support boot. Noticed an ulcer to the left ankle and 3 weeks ago, had swelling increased from the ankle, foot and leg with some increased pain to heel and sole of foot. No significant drainage to wound. No fever, chills, nausea, vomiting, chest pain or shortness of breat.  Past Medical History:  Diagnosis Date  . Diabetes mellitus without complication (HCC)   . Hyperkalemia   . Neuropathy (HCC)   . RA (rheumatoid arthritis) (HCC)   . Renal disorder     There are no active problems to display for this patient.   Past Surgical History:  Procedure Laterality Date  . ambutation toes    . CHOLECYSTECTOMY         Home Medications    Prior to Admission medications   Medication Sig Start Date End Date Taking? Authorizing Provider  aspirin EC 81 MG tablet Take 81 mg by mouth daily.   Yes Historical Provider, MD  Cholecalciferol (VITAMIN D) 2000 units CAPS Take 1 capsule by mouth daily.    Yes Historical Provider, MD  cycloSPORINE (RESTASIS) 0.05 % ophthalmic emulsion Place 1 drop into both eyes 2 (two) times daily. 11/13/13  Yes Historical Provider, MD  Flax OIL Take 2-3,000 mg by mouth daily.    Yes Historical Provider, MD  folic acid (FOLVITE) 1 MG tablet Take 1 mg by mouth every morning.  05/04/15  Yes Historical Provider, MD  HUMALOG 100 UNIT/ML injection 1-80 Units by Pump Prime route continuous. approx 80 units/24hrs 07/11/15  Yes  Historical Provider, MD  methotrexate (RHEUMATREX) 2.5 MG tablet Take 4 tablets by mouth every Sunday. (10mg  total) 07/06/15  Yes Historical Provider, MD  Multiple Vitamin (MULTIVITAMIN WITH MINERALS) TABS tablet Take 1 tablet by mouth every morning.   Yes Historical Provider, MD  Omega-3 Fatty Acids (FISH OIL CONCENTRATE) 300 MG CAPS Take 2 g by mouth daily.   Yes Historical Provider, MD  omeprazole (PRILOSEC) 10 MG capsule Take 10 mg by mouth every morning.   Yes Historical Provider, MD  amoxicillin-clavulanate (AUGMENTIN) 875-125 MG tablet Take 1 tablet by mouth 2 (two) times daily. 10/31/16 11/14/16  11/16/16, MD  sulfamethoxazole-trimethoprim (BACTRIM DS,SEPTRA DS) 800-160 MG tablet Take 1 tablet by mouth 2 (two) times daily. 10/31/16 11/14/16  11/16/16, MD    Family History History reviewed. No pertinent family history.  Social History Social History  Substance Use Topics  . Smoking status: Never Smoker  . Smokeless tobacco: Never Used  . Alcohol use No     Allergies   Ace inhibitors   Review of Systems Review of Systems 10/14 systems reviewed and are negative other than those stated in the HPI   Physical Exam Updated Vital Signs BP 134/67 (BP Location: Left Arm)   Pulse 62   Temp 97.9 F (36.6 C) (Oral)   Resp 16   Ht 5\' 7"  (1.702 m)  Wt 210 lb (95.3 kg)   SpO2 100%   BMI 32.89 kg/m   Physical Exam Physical Exam  Nursing note and vitals reviewed. Constitutional: Well developed, well nourished, non-toxic, and in no acute distress Head: Normocephalic and atraumatic.  Mouth/Throat: Oropharynx is clear and moist.  Neck: Normal range of motion. Neck supple.  Cardiovascular: Normal rate and regular rhythm.   Pulmonary/Chest: Effort normal and breath sounds normal.  Abdominal: Soft. There is no tenderness. There is no rebound and no guarding.  Musculoskeletal: Swelling to the right leg from below knee to foot. 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  Dime-sized ulcer into subcutaneous tissue. Warmth and mild overlying erythema from foot to mid calf. Neurological: Alert, no facial droop, fluent speech, moves all extremities symmetrically Skin: Skin is warm and dry.  Psychiatric: Cooperative   ED Treatments / Results  Labs (all labs ordered are listed, but only abnormal results are displayed) Labs Reviewed  CBC WITH DIFFERENTIAL/PLATELET - Abnormal; Notable for the following:       Result Value   RBC 4.06 (*)    Hemoglobin 12.2 (*)    HCT 37.2 (*)    All other components within normal limits  BASIC METABOLIC PANEL - Abnormal; Notable for the following:    Glucose, Bld 170 (*)    BUN 29 (*)    Creatinine, Ser 1.29 (*)    GFR calc non Af Amer 54 (*)    All other components within normal limits  SEDIMENTATION RATE - Abnormal; Notable for the following:    Sed Rate 73 (*)    All other components within normal limits    EKG  EKG Interpretation None       Radiology Dg Ankle Complete Right  Result Date: 10/31/2016 CLINICAL DATA:  Initial evaluation for acute pain and swelling for 3-4 weeks. History of prior surgeries with multiple infections. History of diabetes. EXAM: RIGHT ANKLE - COMPLETE 3+ VIEW COMPARISON:  None. FINDINGS: There is extensive irregularity with osseous destruction and disorganization about the right ankle joint, likely related to Charcot joint. Distal tibiotalar articulation is disrupted, with erosive changes at the distal tibia and fibula. Associated periosteal reaction noted at the distal tibia. Extensive osseous destruction and irregularity about the right mid and hindfoot. No definite superimposed fracture. Diffuse soft tissue swelling. No dissecting soft tissue emphysema. Prominent vascular calcifications noted. IMPRESSION: 1. 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 2. Diffuse soft tissue swelling about the ankle.  Electronically Signed   By: Rise Mu M.D.   On: 10/31/2016 15:53   US Venous Img Lower Unilateral Right  Result Date: 10/31/2016 CLINICAL DATA:  Right lower extremity pain and edema. Ulcer involving the right ankle. Evaluate for DVT. EXAM: RIGHT LOWER EXTREMITY VENOUS DOPPLER ULTRASOUND TECHNIQUE: Gray-scale sonography with graded compression, as well as color Doppler and duplex ultrasound were performed to evaluate the lower extremity deep venous systems from the level of the common femoral vein and including the common femoral, femoral, profunda femoral, popliteal and calf veins including the posterior tibial, peroneal and gastrocnemius veins when visible. The superficial great saphenous vein was also interrogated. Spectral Doppler was utilized to evaluate flow at rest and with distal augmentation maneuvers in the common femoral, femoral and popliteal veins. COMPARISON:  None. FINDINGS: Contralateral Common Femoral Vein: Respiratory phasicity is normal and symmetric with the symptomatic side. No evidence of thrombus. Normal compressibility. Common Femoral Vein: No evidence of thrombus.  Normal compressibility, respiratory phasicity and response to augmentation. Saphenofemoral Junction: No evidence of thrombus. Normal compressibility and flow on color Doppler imaging. Profunda Femoral Vein: No evidence of thrombus. Normal compressibility and flow on color Doppler imaging. Femoral Vein: No evidence of thrombus. Normal compressibility, respiratory phasicity and response to augmentation. Popliteal Vein: No evidence of thrombus. Normal compressibility, respiratory phasicity and response to augmentation. Calf Veins: No evidence of thrombus. Normal compressibility and flow on color Doppler imaging. Superficial Great Saphenous Vein: No evidence of thrombus. Normal compressibility and flow on color Doppler imaging. Venous Reflux:  None. Other Findings: There is a minimal amount of subcutaneous edema noted at  the level of the right lower leg. IMPRESSION: No evidence of DVT within the right lower extremity. Electronically Signed   By: Simonne Come M.D.   On: 10/31/2016 16:47    Procedures Procedures (including critical care time)  Medications Ordered in ED Medications - No data to display   Initial Impression / Assessment and Plan / ED Course  I have reviewed the triage vital signs and the nursing notes.  Pertinent labs & imaging results that were available during my care of the patient were reviewed by me and considered in my medical decision making (see chart for details).  Clinical Course     With diabetic food ulcer of the right ankle and cellulitis. Ulcer about dime sized with extension into subcutaneous tissue. X-ray visualized and diffcult to interpret due to bony destruction c/w charcot deformity. Afebrile and HD stable w/o systemic s/s of illness. Blood work reassuring but with elevated inflammatory markers. Considered that he is immune suppressed and may not respond to antibiotics, but currently without any systemic signs or symptoms of illness. He is well appearing. He has followup w/  PCP in 4-5 days. Patient comfortable with outpatient treatment. Started on augmentin and bactrim to cover strep, MRSA, and gram negatives and anaerobes. Given podiatry follow-up. Discussed strict return instructions that would warrant re-evaluation and potential admission. He expressed understanding of all discharge instructions and felt comfortable with the plan of care.   Final Clinical Impressions(s) / ED Diagnoses   Final diagnoses:  Cellulitis of right lower extremity  Diabetic ulcer of ankle (HCC)    New Prescriptions New Prescriptions   AMOXICILLIN-CLAVULANATE (AUGMENTIN) 875-125 MG TABLET    Take 1 tablet by mouth 2 (two) times daily.   SULFAMETHOXAZOLE-TRIMETHOPRIM (BACTRIM DS,SEPTRA DS) 800-160 MG TABLET    Take 1 tablet by mouth 2 (two) times daily.     Lavera Guise, MD 10/31/16  [phone]

## 2016-11-05 NOTE — Telephone Encounter (Signed)
OK for 3 months MTX. I also think he should restart Arava to get arthritis back under control and he can consider doing a longer course of Pred with slow taper to try to control symptoms in the meantime. Let me know what he wants to do.

## 2016-11-05 NOTE — Telephone Encounter (Signed)
He called for a few things today:    1)  You had him take a tapering dose of Prednisone a few weeks ago for joint pain.  He called to let you know that he is still having joint pain in his shoulders, hands and wrists.  He is very achy all over and is worse than he was prior to starting on the Prednisone.    2)  He has moved out of town and will be seeking a Insurance claims handler.  He will be back in [place] on Wednesday and will have his blood work done, he will be a little early.  He would rather have it done here so he can get a refill on Mtx before he runs out.  He is requesting a 90 day supply.  Is this OK to dispense? If so, I will pend for you to sign, I will enter the [facility] in [facility]- [phone]

## 2016-11-06 NOTE — Telephone Encounter (Signed)
Voicemail not set up, try again

## 2016-11-07 ENCOUNTER — Other Ambulatory Visit: Admit: 2016-11-07 | Payer: Medicare (Managed Care)

## 2016-11-07 DIAGNOSIS — Z79899 Other long term (current) drug therapy: Secondary | ICD-10-CM

## 2016-11-07 LAB — SED RATE: Sed Rate: 51 mm/h — ABNORMAL HIGH (ref 0–20)

## 2016-11-08 MED ORDER — predniSONE (DELTASONE) 5 MG tablet
5 | ORAL_TABLET | ORAL | 0 refills | Status: AC
Start: 2016-11-08 — End: ?

## 2016-11-08 MED ORDER — leflunomide (ARAVA) 10 MG tablet
10 | ORAL_TABLET | Freq: Every day | ORAL | 2 refills | Status: AC
Start: 2016-11-08 — End: ?

## 2016-11-08 NOTE — Telephone Encounter (Signed)
Patient has been notified    He would like to start Arava and Prednisone taper.  Please send to [facility] in [place]

## 2016-11-08 NOTE — Telephone Encounter (Signed)
Arava 10mg  qD and Pred 20mg  taper x 1 month sent.

## 2016-11-09 NOTE — Telephone Encounter (Signed)
Pt has been notified that Rx were sent in

## 2016-11-15 NOTE — Telephone Encounter (Signed)
MEDICATION REQUESTED:   mtx      MOST CURRENT LAB( MEDICAL ASSISTANT SHOULD CHECK MEDICAL RECORDS OFFICE, [facility] AND CARE EVERYWHERE FOR LABS AND THEN NOTE WHERE THE LABS ARE LOCATED AND ANY ABNORMALITIES HERE IN THIS MESSAGE):       Specimen on 11/07/2016   Component Date Value Ref Range Status    Sed Rate 11/07/2016 51* 0 - 20 mm/hr Final   Appointment on 09/10/2016   Component Date Value Ref Range Status    WBC 09/10/2016 6.9  3.8 - 10.8 10E3/uL Final    RBC 09/10/2016 4.27  4.20 - 5.80 10E6/uL Final    Hemoglobin 09/10/2016 13.3  13.2 - 17.1 g/dL Final    Hematocrit 72/53/6644 39.3  38.5 - 50.0 % Final    MCV 09/10/2016 92.0  80.0 - 100.0 fL Final    MCH 09/10/2016 31.1  27.0 - 33.0 pg Final    MCHC 09/10/2016 33.9  32.0 - 36.0 g/dL Final    RDW 03/47/4259 16.0* 11.0 - 15.0 % Final    Platelets 09/10/2016 144  140 - 400 10E3/uL Final    MPV 09/10/2016 7.6  7.5 - 11.5 fL Final    Total Bilirubin 09/10/2016 1.1  0.0 - 1.5 mg/dL Final    Bilirubin, Direct 09/10/2016 0.20  0.00 - 0.40 mg/dL Final    AST 56/38/7564 24  13 - 39 U/L Final    ALT 09/10/2016 12  7 - 52 U/L Final    Alkaline Phosphatase 09/10/2016 117  36 - 125 U/L Final    Total Protein 09/10/2016 6.4  6.4 - 8.9 g/dL Final    Albumin 33/29/5188 3.6  3.5 - 5.7 g/dL Final    Bilirubin, Indirect 09/10/2016 0.90  0.00 - 1.10 mg/dL Final    Creatinine 41/66/0630 1.27  0.60 - 1.30 mg/dL Final    eGFR AA CKD-EPI 09/10/2016 65  See note. Final    eGFR NONAA CKD-EPI 09/10/2016 56  See note. Final    Sed Rate 09/10/2016 33* 0 - 20 mm/hr Final         LAST APPT:    Last office visit:  10/03/2016  Last procedure visit:  Visit date not found    QUALITY INDICATOR FLOWSHEET RESULTS:   No flowsheet data found.        ALLERGIES:       Allergies   Allergen Reactions    Ace Inhibitors Other (See Comments)     hypotension       (MEDICAL ASSISTANT SHOULD CHECK [facility] AND DOCUMENT IN ALLERGY SECTION                                             AND MARK AS REVIEWED)

## 2016-11-15 NOTE — Progress Notes (Signed)
Mail orders to: [address]                         [place], [place] [address]

## 2016-11-16 MED ORDER — methotrexate 2.5 MG tablet
2.5 | ORAL_TABLET | ORAL | 0 refills | Status: AC
Start: 2016-11-16 — End: ?

## 2016-12-10 ENCOUNTER — Encounter (HOSPITAL_BASED_OUTPATIENT_CLINIC_OR_DEPARTMENT_OTHER): Payer: Medicare Other | Attending: Internal Medicine

## 2016-12-10 DIAGNOSIS — E1042 Type 1 diabetes mellitus with diabetic polyneuropathy: Secondary | ICD-10-CM | POA: Diagnosis not present

## 2016-12-10 DIAGNOSIS — E1051 Type 1 diabetes mellitus with diabetic peripheral angiopathy without gangrene: Secondary | ICD-10-CM | POA: Insufficient documentation

## 2016-12-10 DIAGNOSIS — M069 Rheumatoid arthritis, unspecified: Secondary | ICD-10-CM | POA: Insufficient documentation

## 2016-12-10 DIAGNOSIS — E10610 Type 1 diabetes mellitus with diabetic neuropathic arthropathy: Secondary | ICD-10-CM | POA: Insufficient documentation

## 2016-12-10 DIAGNOSIS — Z9641 Presence of insulin pump (external) (internal): Secondary | ICD-10-CM | POA: Insufficient documentation

## 2016-12-10 DIAGNOSIS — L97311 Non-pressure chronic ulcer of right ankle limited to breakdown of skin: Secondary | ICD-10-CM | POA: Diagnosis not present

## 2016-12-10 DIAGNOSIS — E10622 Type 1 diabetes mellitus with other skin ulcer: Secondary | ICD-10-CM | POA: Diagnosis not present

## 2016-12-14 ENCOUNTER — Other Ambulatory Visit (HOSPITAL_COMMUNITY)
Admission: RE | Admit: 2016-12-14 | Discharge: 2016-12-14 | Disposition: A | Discharge status: Home / Self Care | Payer: Medicare Other | Source: Ambulatory Visit | Attending: Specialist | Admitting: Specialist

## 2016-12-14 DIAGNOSIS — N183 Chronic kidney disease, stage 3 (moderate): Secondary | ICD-10-CM | POA: Diagnosis present

## 2016-12-14 LAB — CBC
HCT: 41.5 % (ref 39.0–52.0)
Hemoglobin: 13.8 g/dL (ref 13.0–17.0)
MCH: 30 pg (ref 26.0–34.0)
MCHC: 33.3 g/dL (ref 30.0–36.0)
MCV: 90.2 fL (ref 78.0–100.0)
PLATELETS: 182 10*3/uL (ref 150–400)
RBC: 4.6 MIL/uL (ref 4.22–5.81)
RDW: 15.9 % — AB (ref 11.5–15.5)
WBC: 7.8 10*3/uL (ref 4.0–10.5)

## 2016-12-14 LAB — RENAL FUNCTION PANEL
ALBUMIN: 3.6 g/dL (ref 3.5–5.0)
Anion gap: 7 (ref 5–15)
BUN: 24 mg/dL — AB (ref 6–20)
CALCIUM: 9.3 mg/dL (ref 8.9–10.3)
CO2: 29 mmol/L (ref 22–32)
CREATININE: 1.24 mg/dL (ref 0.61–1.24)
Chloride: 103 mmol/L (ref 101–111)
GFR calc Af Amer: >60 mL/min (ref >60)
GFR, EST NON AFRICAN AMERICAN: 56 mL/min — AB (ref >60)
Glucose, Bld: 172 mg/dL — ABNORMAL HIGH (ref 65–99)
PHOSPHORUS: 3.2 mg/dL (ref 2.5–4.6)
Potassium: 4.1 mmol/L (ref 3.5–5.1)
Sodium: 139 mmol/L (ref 135–145)

## 2016-12-14 LAB — PROTEIN, URINE, RANDOM: TOTAL PROTEIN, URINE: 25 mg/dL

## 2016-12-14 LAB — CREATININE, URINE, RANDOM: CREATININE, URINE: 193.74 mg/dL

## 2016-12-15 LAB — VITAMIN D 25 HYDROXY (VIT D DEFICIENCY, FRACTURES): VIT D 25 HYDROXY: 49.4 ng/mL (ref 30.0–100.0)

## 2016-12-15 LAB — URINALYSIS, ROUTINE W REFLEX MICROSCOPIC
BILIRUBIN URINE: NEGATIVE
Glucose, UA: 250 mg/dL — AB
Hgb urine dipstick: NEGATIVE
KETONES UR: NEGATIVE mg/dL
Leukocytes, UA: NEGATIVE
NITRITE: NEGATIVE
PH: 5.5 (ref 5.0–8.0)
PROTEIN: NEGATIVE mg/dL
Specific Gravity, Urine: >1.03 — ABNORMAL HIGH (ref 1.005–1.030)

## 2016-12-15 LAB — PARATHYROID HORMONE, INTACT (NO CA): PTH: 23 pg/mL (ref 15–65)

## 2016-12-28 ENCOUNTER — Encounter (HOSPITAL_BASED_OUTPATIENT_CLINIC_OR_DEPARTMENT_OTHER): Payer: Medicare Other | Attending: Internal Medicine

## 2016-12-28 DIAGNOSIS — E1051 Type 1 diabetes mellitus with diabetic peripheral angiopathy without gangrene: Secondary | ICD-10-CM | POA: Diagnosis not present

## 2016-12-28 DIAGNOSIS — M069 Rheumatoid arthritis, unspecified: Secondary | ICD-10-CM | POA: Diagnosis not present

## 2016-12-28 DIAGNOSIS — E10610 Type 1 diabetes mellitus with diabetic neuropathic arthropathy: Secondary | ICD-10-CM | POA: Diagnosis not present

## 2016-12-28 DIAGNOSIS — E10622 Type 1 diabetes mellitus with other skin ulcer: Secondary | ICD-10-CM | POA: Diagnosis not present

## 2016-12-28 DIAGNOSIS — E1042 Type 1 diabetes mellitus with diabetic polyneuropathy: Secondary | ICD-10-CM | POA: Diagnosis not present

## 2016-12-28 DIAGNOSIS — Z89421 Acquired absence of other right toe(s): Secondary | ICD-10-CM | POA: Diagnosis not present

## 2016-12-28 DIAGNOSIS — Z89432 Acquired absence of left foot: Secondary | ICD-10-CM | POA: Insufficient documentation

## 2016-12-28 DIAGNOSIS — L97311 Non-pressure chronic ulcer of right ankle limited to breakdown of skin: Secondary | ICD-10-CM | POA: Insufficient documentation

## 2017-01-04 DIAGNOSIS — E10622 Type 1 diabetes mellitus with other skin ulcer: Secondary | ICD-10-CM | POA: Diagnosis not present

## 2017-01-09 ENCOUNTER — Encounter: Payer: Medicare (Managed Care) | Attending: Rheumatology

## 2017-01-16 ENCOUNTER — Other Ambulatory Visit: Payer: Self-pay | Admitting: Internal Medicine

## 2017-01-16 ENCOUNTER — Ambulatory Visit (HOSPITAL_COMMUNITY)
Admission: RE | Admit: 2017-01-16 | Discharge: 2017-01-16 | Disposition: A | Discharge status: Home / Self Care | Payer: Medicare Other | Source: Ambulatory Visit | Attending: Vascular Surgery | Admitting: Vascular Surgery

## 2017-01-16 DIAGNOSIS — L97919 Non-pressure chronic ulcer of unspecified part of right lower leg with unspecified severity: Secondary | ICD-10-CM

## 2017-01-16 DIAGNOSIS — L97319 Non-pressure chronic ulcer of right ankle with unspecified severity: Secondary | ICD-10-CM | POA: Diagnosis not present

## 2017-01-17 ENCOUNTER — Ambulatory Visit (HOSPITAL_COMMUNITY)
Admission: RE | Admit: 2017-01-17 | Discharge: 2017-01-17 | Disposition: A | Discharge status: Home / Self Care | Payer: Medicare Other | Source: Ambulatory Visit | Attending: Vascular Surgery | Admitting: Vascular Surgery

## 2017-01-17 DIAGNOSIS — L97919 Non-pressure chronic ulcer of unspecified part of right lower leg with unspecified severity: Secondary | ICD-10-CM

## 2017-01-17 LAB — VAS US LOWER EXTREMITY ARTERIAL DUPLEX
RIGHT ANT DIST TIBAL SYS PSV: 164 cm/s
RIGHT POST TIB DIST SYS: 50 cm/s
RSFDPSV: -108 cm/s
RSFPPSV: 119 cm/s
Right super femoral mid sys PSV: -104 cm/s

## 2017-01-18 DIAGNOSIS — E10622 Type 1 diabetes mellitus with other skin ulcer: Secondary | ICD-10-CM | POA: Diagnosis not present

## 2017-02-01 ENCOUNTER — Encounter (HOSPITAL_BASED_OUTPATIENT_CLINIC_OR_DEPARTMENT_OTHER): Payer: Medicare Other | Attending: Internal Medicine

## 2017-02-01 DIAGNOSIS — M14671 Charcot's joint, right ankle and foot: Secondary | ICD-10-CM | POA: Insufficient documentation

## 2017-02-01 DIAGNOSIS — M069 Rheumatoid arthritis, unspecified: Secondary | ICD-10-CM | POA: Insufficient documentation

## 2017-02-01 DIAGNOSIS — E10622 Type 1 diabetes mellitus with other skin ulcer: Secondary | ICD-10-CM | POA: Insufficient documentation

## 2017-02-01 DIAGNOSIS — L97311 Non-pressure chronic ulcer of right ankle limited to breakdown of skin: Secondary | ICD-10-CM | POA: Diagnosis not present

## 2017-02-01 DIAGNOSIS — E1042 Type 1 diabetes mellitus with diabetic polyneuropathy: Secondary | ICD-10-CM | POA: Diagnosis not present

## 2017-02-01 DIAGNOSIS — G40909 Epilepsy, unspecified, not intractable, without status epilepticus: Secondary | ICD-10-CM | POA: Diagnosis not present

## 2017-02-01 DIAGNOSIS — E1051 Type 1 diabetes mellitus with diabetic peripheral angiopathy without gangrene: Secondary | ICD-10-CM | POA: Insufficient documentation

## 2017-02-15 ENCOUNTER — Encounter: Payer: Self-pay | Admitting: Vascular Surgery

## 2017-02-15 DIAGNOSIS — E11621 Type 2 diabetes mellitus with foot ulcer: Secondary | ICD-10-CM | POA: Diagnosis not present

## 2017-02-17 ENCOUNTER — Emergency Department (HOSPITAL_COMMUNITY): Payer: Medicare Other

## 2017-02-17 ENCOUNTER — Encounter (HOSPITAL_COMMUNITY): Payer: Self-pay

## 2017-02-17 ENCOUNTER — Inpatient Hospital Stay (HOSPITAL_COMMUNITY)
Admission: EM | Admit: 2017-02-17 | Discharge: 2017-02-19 | DRG: 638 | Disposition: A | Discharge status: Home / Self Care | Payer: Medicare Other | Attending: Internal Medicine | Admitting: Internal Medicine

## 2017-02-17 ENCOUNTER — Inpatient Hospital Stay (HOSPITAL_COMMUNITY): Payer: Medicare Other

## 2017-02-17 DIAGNOSIS — E11610 Type 2 diabetes mellitus with diabetic neuropathic arthropathy: Secondary | ICD-10-CM | POA: Diagnosis present

## 2017-02-17 DIAGNOSIS — I739 Peripheral vascular disease, unspecified: Secondary | ICD-10-CM | POA: Diagnosis present

## 2017-02-17 DIAGNOSIS — N189 Chronic kidney disease, unspecified: Secondary | ICD-10-CM | POA: Diagnosis present

## 2017-02-17 DIAGNOSIS — E08621 Diabetes mellitus due to underlying condition with foot ulcer: Secondary | ICD-10-CM | POA: Diagnosis not present

## 2017-02-17 DIAGNOSIS — Z9989 Dependence on other enabling machines and devices: Secondary | ICD-10-CM | POA: Diagnosis not present

## 2017-02-17 DIAGNOSIS — Z794 Long term (current) use of insulin: Secondary | ICD-10-CM | POA: Diagnosis not present

## 2017-02-17 DIAGNOSIS — E1140 Type 2 diabetes mellitus with diabetic neuropathy, unspecified: Secondary | ICD-10-CM | POA: Diagnosis present

## 2017-02-17 DIAGNOSIS — L97509 Non-pressure chronic ulcer of other part of unspecified foot with unspecified severity: Secondary | ICD-10-CM

## 2017-02-17 DIAGNOSIS — M069 Rheumatoid arthritis, unspecified: Secondary | ICD-10-CM | POA: Diagnosis present

## 2017-02-17 DIAGNOSIS — L97519 Non-pressure chronic ulcer of other part of right foot with unspecified severity: Secondary | ICD-10-CM | POA: Diagnosis present

## 2017-02-17 DIAGNOSIS — E1151 Type 2 diabetes mellitus with diabetic peripheral angiopathy without gangrene: Secondary | ICD-10-CM | POA: Diagnosis present

## 2017-02-17 DIAGNOSIS — E13621 Other specified diabetes mellitus with foot ulcer: Secondary | ICD-10-CM

## 2017-02-17 DIAGNOSIS — E11621 Type 2 diabetes mellitus with foot ulcer: Secondary | ICD-10-CM | POA: Diagnosis present

## 2017-02-17 DIAGNOSIS — Z79899 Other long term (current) drug therapy: Secondary | ICD-10-CM

## 2017-02-17 DIAGNOSIS — L97319 Non-pressure chronic ulcer of right ankle with unspecified severity: Secondary | ICD-10-CM | POA: Diagnosis present

## 2017-02-17 DIAGNOSIS — K219 Gastro-esophageal reflux disease without esophagitis: Secondary | ICD-10-CM | POA: Diagnosis present

## 2017-02-17 DIAGNOSIS — L97402 Non-pressure chronic ulcer of unspecified heel and midfoot with fat layer exposed: Secondary | ICD-10-CM | POA: Diagnosis not present

## 2017-02-17 DIAGNOSIS — E11622 Type 2 diabetes mellitus with other skin ulcer: Secondary | ICD-10-CM | POA: Diagnosis present

## 2017-02-17 DIAGNOSIS — G629 Polyneuropathy, unspecified: Secondary | ICD-10-CM | POA: Diagnosis present

## 2017-02-17 DIAGNOSIS — Z7982 Long term (current) use of aspirin: Secondary | ICD-10-CM

## 2017-02-17 DIAGNOSIS — E1122 Type 2 diabetes mellitus with diabetic chronic kidney disease: Secondary | ICD-10-CM | POA: Diagnosis present

## 2017-02-17 DIAGNOSIS — L97403 Non-pressure chronic ulcer of unspecified heel and midfoot with necrosis of muscle: Secondary | ICD-10-CM | POA: Diagnosis not present

## 2017-02-17 DIAGNOSIS — G4733 Obstructive sleep apnea (adult) (pediatric): Secondary | ICD-10-CM

## 2017-02-17 DIAGNOSIS — M86071 Acute hematogenous osteomyelitis, right ankle and foot: Secondary | ICD-10-CM

## 2017-02-17 DIAGNOSIS — E1169 Type 2 diabetes mellitus with other specified complication: Secondary | ICD-10-CM | POA: Diagnosis present

## 2017-02-17 DIAGNOSIS — L8990 Pressure ulcer of unspecified site, unspecified stage: Secondary | ICD-10-CM | POA: Diagnosis present

## 2017-02-17 DIAGNOSIS — L03115 Cellulitis of right lower limb: Secondary | ICD-10-CM | POA: Insufficient documentation

## 2017-02-17 DIAGNOSIS — IMO0002 Reserved for concepts with insufficient information to code with codable children: Secondary | ICD-10-CM

## 2017-02-17 HISTORY — DX: Obstructive sleep apnea (adult) (pediatric): G47.33

## 2017-02-17 HISTORY — DX: Sleep apnea, unspecified: G47.30

## 2017-02-17 HISTORY — DX: Chronic kidney disease, unspecified: N18.9

## 2017-02-17 HISTORY — DX: Dependence on other enabling machines and devices: Z99.89

## 2017-02-17 HISTORY — DX: Gastro-esophageal reflux disease without esophagitis: K21.9

## 2017-02-17 LAB — CBC WITH DIFFERENTIAL/PLATELET
Basophils Absolute: 0 10*3/uL (ref 0.0–0.1)
Basophils Relative: 0 %
Eosinophils Absolute: 0 10*3/uL (ref 0.0–0.7)
Eosinophils Relative: 0 %
HCT: 36.3 % — ABNORMAL LOW (ref 39.0–52.0)
HEMOGLOBIN: 12.5 g/dL — AB (ref 13.0–17.0)
LYMPHS ABS: 0.5 10*3/uL — AB (ref 0.7–4.0)
LYMPHS PCT: 4 %
MCH: 30.7 pg (ref 26.0–34.0)
MCHC: 34.4 g/dL (ref 30.0–36.0)
MCV: 89.2 fL (ref 78.0–100.0)
Monocytes Absolute: 1.7 10*3/uL — ABNORMAL HIGH (ref 0.1–1.0)
Monocytes Relative: 15 %
NEUTROS PCT: 81 %
Neutro Abs: 8.9 10*3/uL — ABNORMAL HIGH (ref 1.7–7.7)
Platelets: 141 10*3/uL — ABNORMAL LOW (ref 150–400)
RBC: 4.07 MIL/uL — AB (ref 4.22–5.81)
RDW: 15.2 % (ref 11.5–15.5)
WBC: 11.2 10*3/uL — AB (ref 4.0–10.5)

## 2017-02-17 LAB — BASIC METABOLIC PANEL
Anion gap: 8 (ref 5–15)
BUN: 26 mg/dL — ABNORMAL HIGH (ref 6–20)
CHLORIDE: 98 mmol/L — AB (ref 101–111)
CO2: 25 mmol/L (ref 22–32)
Calcium: 9 mg/dL (ref 8.9–10.3)
Creatinine, Ser: 1.37 mg/dL — ABNORMAL HIGH (ref 0.61–1.24)
GFR calc non Af Amer: 50 mL/min — ABNORMAL LOW (ref >60)
GFR, EST AFRICAN AMERICAN: 58 mL/min — AB (ref >60)
Glucose, Bld: 300 mg/dL — ABNORMAL HIGH (ref 65–99)
POTASSIUM: 4.1 mmol/L (ref 3.5–5.1)
SODIUM: 131 mmol/L — AB (ref 135–145)

## 2017-02-17 LAB — MRSA PCR SCREENING: MRSA by PCR: NEGATIVE

## 2017-02-17 LAB — GLUCOSE, CAPILLARY
GLUCOSE-CAPILLARY: 257 mg/dL — AB (ref 65–99)
GLUCOSE-CAPILLARY: 238 mg/dL — AB (ref 65–99)

## 2017-02-17 MED ORDER — VANCOMYCIN HCL 10 G IV SOLR
1500.0000 mg | Freq: Once | INTRAVENOUS | Status: AC
  Administered 2017-02-17: 1500 mg via INTRAVENOUS
  Filled 2017-02-17: qty 1500

## 2017-02-17 MED ORDER — SODIUM CHLORIDE 0.9% FLUSH
3.0000 mL | Freq: Two times a day (BID) | INTRAVENOUS | Status: DC
  Administered 2017-02-17 – 2017-02-19 (×5): 3 mL via INTRAVENOUS

## 2017-02-17 MED ORDER — INSULIN ASPART 100 UNIT/ML ~~LOC~~ SOLN: 4.0000 [IU] | Freq: Three times a day (TID) | SUBCUTANEOUS | Status: DC

## 2017-02-17 MED ORDER — VANCOMYCIN HCL 10 G IV SOLR
INTRAVENOUS | Status: AC
  Filled 2017-02-17: qty 1500

## 2017-02-17 MED ORDER — SODIUM CHLORIDE 0.9 % IV SOLN: 250.0000 mL | INTRAVENOUS | Status: DC | PRN

## 2017-02-17 MED ORDER — PIPERACILLIN-TAZOBACTAM 3.375 G IVPB 30 MIN
3.3750 g | Freq: Once | INTRAVENOUS | Status: AC
  Administered 2017-02-17: 3.375 g via INTRAVENOUS
  Filled 2017-02-17: qty 50

## 2017-02-17 MED ORDER — INSULIN ASPART 100 UNIT/ML ~~LOC~~ SOLN: 0.0000 [IU] | Freq: Three times a day (TID) | SUBCUTANEOUS | Status: DC

## 2017-02-17 MED ORDER — INSULIN ASPART 100 UNIT/ML ~~LOC~~ SOLN: 0.0000 [IU] | Freq: Every day | SUBCUTANEOUS | Status: DC

## 2017-02-17 MED ORDER — VANCOMYCIN HCL IN DEXTROSE 1-5 GM/200ML-% IV SOLN
1000.0000 mg | Freq: Two times a day (BID) | INTRAVENOUS | Status: DC
  Administered 2017-02-17 – 2017-02-18 (×2): 1000 mg via INTRAVENOUS
  Filled 2017-02-17 (×2): qty 200

## 2017-02-17 MED ORDER — ACETAMINOPHEN 325 MG PO TABS
650.0000 mg | ORAL_TABLET | Freq: Four times a day (QID) | ORAL | Status: DC | PRN
  Administered 2017-02-17 – 2017-02-18 (×2): 650 mg via ORAL
  Filled 2017-02-17 (×2): qty 2

## 2017-02-17 MED ORDER — PIPERACILLIN-TAZOBACTAM 3.375 G IVPB
3.3750 g | Freq: Three times a day (TID) | INTRAVENOUS | Status: DC
  Administered 2017-02-17 – 2017-02-18 (×4): 3.375 g via INTRAVENOUS
  Filled 2017-02-17 (×5): qty 50

## 2017-02-17 MED ORDER — ACETAMINOPHEN 650 MG RE SUPP: 650.0000 mg | Freq: Four times a day (QID) | RECTAL | Status: DC | PRN

## 2017-02-17 MED ORDER — SODIUM CHLORIDE 0.9% FLUSH: 3.0000 mL | INTRAVENOUS | Status: DC | PRN

## 2017-02-17 NOTE — Progress Notes (Signed)
Pharmacy Antibiotic Note  Andrew Schultz is a 71 y.o. male admitted on 02/17/2017 with wound infection.  Pharmacy has been consulted for [facility] AND ZOSYN dosing.  Plan:  Vancomycin 1000mg  IV q12h Check trough at steady state Zosyn 3.375gm IV q8h, EID Monitor labs, renal fxn, progress and c/s Deescalate ABX when improved / appropriate.    Height: 5' 7.5" (171.5 cm) Weight: 207 lb 14.3 oz (94.3 kg) IBW/kg (Calculated) : 67.25  Temp (24hrs), Avg:99.5 F (37.5 C), Min:99.2 F (37.3 C), Max:99.8 F (37.7 C)   Recent Labs Lab 02/17/17 0209  WBC 11.2*  CREATININE 1.37*    Estimated Creatinine Clearance: 53.8 mL/min (A) (by C-G formula based on SCr of 1.37 mg/dL (H)).    No Known Allergies  Antimicrobials this admission: Vanc 3/25 >>  Zosyn 3/25 >>   Dose adjustments this admission:  Microbiology results:  BCx: pending  UCx: pending   Sputum:    MRSA PCR:   Thank you for allowing pharmacy to be a part of this patient's care.  4/25 A 02/17/2017 9:20 AM

## 2017-02-17 NOTE — H&P (Signed)
History and Physical    Andrew Schultz UKG:[id number] DOB: 06/29/1946 DOA: 02/17/2017  PCP: Dwana Melena, MD  Patient coming from:  home  Chief Complaint:  Foot getting worse  HPI: Andrew Schultz is a 71 y.o. 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  Pt reports he has been going to the wound clinic and it has been healing well until Thursday when it started draining and getting red.  He went to wound clinic Friday who started him on oral augmentin and arranged for an outpt mri to be done.  It got worse so he came to the ED.  Reports fever.  No chills.  No n/v/d.   Review of Systems: As per HPI otherwise 10 point review of systems negative.   Past Medical History:  Diagnosis Date  . Apnea, sleep   . CKD (chronic kidney disease)   . Diabetes mellitus without complication (HCC)   . GERD (gastroesophageal reflux disease)   . Hyperkalemia   . Neuropathy (HCC)   . Obstructive sleep apnea on CPAP   . RA (rheumatoid arthritis) (HCC)   . Renal disorder     Past Surgical History:  Procedure Laterality Date  . ambutation toes    . CHOLECYSTECTOMY       reports that he has never smoked. He has never used smokeless tobacco. He reports that he does not drink alcohol or use drugs.  No Known Allergies  No family history on file. no premature CAD  Prior to Admission medications   Medication Sig Start Date End Date Taking? Authorizing Provider  amoxicillin-clavulanate (AUGMENTIN) 875-125 MG tablet Take 1 tablet by mouth 2 (two) times daily.   Yes Historical Provider, MD  doxycycline (VIBRAMYCIN) 100 MG capsule Take 100 mg by mouth 2 (two) times daily.   Yes Historical Provider, MD  leflunomide (ARAVA) 10 MG tablet Take 10 mg by mouth daily.   Yes Historical Provider, MD  aspirin EC 81 MG tablet Take 81 mg by mouth daily.    Historical Provider, MD  Cholecalciferol (VITAMIN D) 2000  units CAPS Take 1 capsule by mouth daily.     Historical Provider, MD  cycloSPORINE (RESTASIS) 0.05 % ophthalmic emulsion Place 1 drop into both eyes 2 (two) times daily. 11/13/13   Historical Provider, MD  Flax OIL Take 2-3,000 mg by mouth daily.     Historical Provider, MD  folic acid (FOLVITE) 1 MG tablet Take 1 mg by mouth every morning.  05/04/15   Historical Provider, MD  HUMALOG 100 UNIT/ML injection 1-80 Units by Pump Prime route continuous. approx 80 units/24hrs 07/11/15   Historical Provider, MD  methotrexate (RHEUMATREX) 2.5 MG tablet Take 4 tablets by mouth every Sunday. (10mg  total) 07/06/15   Historical Provider, MD  Multiple Vitamin (MULTIVITAMIN WITH MINERALS) TABS tablet Take 1 tablet by mouth every morning.    Historical Provider, MD  Omega-3 Fatty Acids (FISH OIL CONCENTRATE) 300 MG CAPS Take 2 g by mouth daily.    Historical Provider, MD  omeprazole (PRILOSEC) 10 MG capsule Take 10 mg by mouth every morning.    Historical Provider, MD    Physical Exam: Vitals:   02/17/17 0137 02/17/17 0138 02/17/17 0200  BP: 132/63  (!) 119/55  Pulse: 93  84  Resp: 20    Temp: 99.8 F (37.7 C)    TempSrc: Oral    SpO2: 90%  96%  Weight:  94.3 kg (208 lb)   Height:  5\' 7"  (1.702 m)     Constitutional: NAD, calm, comfortable Vitals:   02/17/17 0137 02/17/17 0138 02/17/17 0200  BP: 132/63  (!) 119/55  Pulse: 93  84  Resp: 20    Temp: 99.8 F (37.7 C)    TempSrc: Oral    SpO2: 90%  96%  Weight:  94.3 kg (208 lb)   Height:  5\' 7"  (1.702 m)    Eyes: PERRL, lids and conjunctivae normal ENMT: Mucous membranes are moist. Posterior pharynx clear of any exudate or lesions.Normal dentition.  Neck: normal, supple, no masses, no thyromegaly Respiratory: clear to auscultation bilaterally, no wheezing, no crackles. Normal respiratory effort. No accessory muscle use.  Cardiovascular: Regular rate and rhythm, no murmurs / rubs / gallops. No extremity edema. 2+ pedal pulses. No carotid bruits.    Abdomen: no tenderness, no masses palpated. No hepatosplenomegaly. Bowel sounds positive.  Musculoskeletal: no clubbing / cyanosis. Right foot swollen with open ulcer with some mild erythema surrouding it. Good ROM, no contractures. Normal muscle tone.  Skin: no rashes, lesions, ulcers. No induration Neurologic: CN 2-12 grossly intact. Sensation intact, DTR normal. Strength 5/5 in all 4.  Psychiatric: Normal judgment and insight. Alert and oriented x 3. Normal mood.    Labs on Admission: I have personally reviewed following labs and imaging studies  CBC:  Recent Labs Lab 02/17/17 0209  WBC 11.2*  NEUTROABS 8.9*  HGB 12.5*  HCT 36.3*  MCV 89.2  PLT 141*   Basic Metabolic Panel:  Recent Labs Lab 02/17/17 0209  NA 131*  K 4.1  CL 98*  CO2 25  GLUCOSE 300*  BUN 26*  CREATININE 1.37*  CALCIUM 9.0   GFR: Estimated Creatinine Clearance: 53.4 mL/min (A) (by C-G formula based on SCr of 1.37 mg/dL (H)).  Radiological Exams on Admission: Dg Foot Complete Right  Result Date: 02/17/2017 CLINICAL DATA:  Initial evaluation for diabetic wound to right medial malleolus. EXAM: RIGHT FOOT COMPLETE - 3+ VIEW COMPARISON:  Prior radiograph from 10/31/2016. 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 No acute fracture. Soft tissue ulceration present at the medial aspect of the right ankle at the medial malleolus. Ulceration extends nearly to the underlying bone. Extensive periosteal reaction within this region related to the Charcot joint. Early osteomyelitis would be extremely difficult to exclude. No frank osseous erosions. Diffuse soft tissue swelling about the foot. No dissecting soft tissue emphysema. Vascular calcifications noted. IMPRESSION: 1. Soft tissue ulceration at the medial malleolus. Underlying  periosteal reaction within this region related to Charcot joint. No frank osseous erosion to suggest acute osteomyelitis, although please note this would be extremely difficult to exclude. 2. Extensive osseous destruction with disorganization about the right ankle and right mid/hindfoot, compatible with Charcot joint/diabetic neuropathy. 3. 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 4. No acute fracture or dislocation. 5. Diffuse soft tissue swelling about the foot. Electronically Signed   By: 02/19/2017 M.D.   On: 02/17/2017 02:56   Old chart reviewed Case discussed with dr Rise Mu in ED  Assessment/Plan 71 yo IDDM male comes in with diabetic foot ulcer  Principal Problem:   Diabetic foot ulcer (HCC)- place on vanc/zosyn.  Obtain mri foot in the am to r/o osteo.  High risk for  needing amputation ultimately.  Active Problems:   RA (rheumatoid arthritis) (HCC)- noted   Obstructive sleep apnea on CPAP- noted   Neuropathy (HCC)- noted    DM - SSI  Med rec pending   DVT prophylaxis:  Ambulate, scds. Code Status:  full Family Communication:  none Disposition Plan:  Per day team Consults called:  none Admission status:  admission   Lamount Bankson A MD [facility]  If 7PM-7AM, please contact night-coverage [email or web address] Password [email or web address]  02/17/2017, 4:00 AM

## 2017-02-17 NOTE — Progress Notes (Signed)
Patient has CPAP ordered but says he would rather wear his own CPAP from home. Explained to him we had cpap's here he could wear. Says if he happens to stay another night he would get his wife to bring his. He says he would be okay without it for tonight. I let him know if he changed his mind he could have his nurse call me. RT will continue to monitor.

## 2017-02-17 NOTE — ED Provider Notes (Signed)
[facility]-EMERGENCY DEPT Provider Note   CSN: [id number] Arrival date & time: 02/17/17  0110     History   Chief Complaint Chief Complaint  Patient presents with  . Wound Infection  Patient gave verbal permission to utilize photo for medical documentation only The image was not stored on any personal device   HPI Andrew Schultz is a 71 y.o. male.  The history is provided by the patient and the spouse.  Foot Pain  This is a new problem. The current episode started 2 days ago. The problem occurs constantly. The problem has been gradually worsening. Pertinent negatives include no chest pain, no abdominal pain and no headaches. The symptoms are aggravated by walking. The symptoms are relieved by rest.  Patient with h/o diabetes, h/o charcot foot presents with worsening right foot pain/redness He reports he has had issues with this foot for several months and was managed by podiatrist The wounds on foot started to worsen and he was referred to wound center in [place] He reports he was seen 2 days ago at wound center and reports cultures taken, given oral antibiotics (doxycycline/augmentin), scheduled for MRI and told to go to ER for fever or worsening redness Over past 24 hours the redness has worsened and he had tmax at 102  He denies HA/vomiting/diarrhea He has minimal cough No other acute complaints  Past Medical History:  Diagnosis Date  . Apnea, sleep   . Diabetes mellitus without complication (HCC)   . GERD (gastroesophageal reflux disease)   . Hyperkalemia   . Neuropathy (HCC)   . Obstructive sleep apnea on CPAP   . RA (rheumatoid arthritis) (HCC)   . Renal disorder     There are no active problems to display for this patient.   Past Surgical History:  Procedure Laterality Date  . ambutation toes    . CHOLECYSTECTOMY         Home Medications    Prior to Admission medications   Medication Sig Start Date End Date Taking? Authorizing Provider  amoxicillin-clavulanate  (AUGMENTIN) 875-125 MG tablet Take 1 tablet by mouth 2 (two) times daily.   Yes Historical Provider, MD  doxycycline (VIBRAMYCIN) 100 MG capsule Take 100 mg by mouth 2 (two) times daily.   Yes Historical Provider, MD  leflunomide (ARAVA) 10 MG tablet Take 10 mg by mouth daily.   Yes Historical Provider, MD  aspirin EC 81 MG tablet Take 81 mg by mouth daily.    Historical Provider, MD  Cholecalciferol (VITAMIN D) 2000 units CAPS Take 1 capsule by mouth daily.     Historical Provider, MD  cycloSPORINE (RESTASIS) 0.05 % ophthalmic emulsion Place 1 drop into both eyes 2 (two) times daily. 11/13/13   Historical Provider, MD  Flax OIL Take 2-3,000 mg by mouth daily.     Historical Provider, MD  folic acid (FOLVITE) 1 MG tablet Take 1 mg by mouth every morning.  05/04/15   Historical Provider, MD  HUMALOG 100 UNIT/ML injection 1-80 Units by Pump Prime route continuous. approx 80 units/24hrs 07/11/15   Historical Provider, MD  methotrexate (RHEUMATREX) 2.5 MG tablet Take 4 tablets by mouth every Sunday. (10mg  total) 07/06/15   Historical Provider, MD  Multiple Vitamin (MULTIVITAMIN WITH MINERALS) TABS tablet Take 1 tablet by mouth every morning.    Historical Provider, MD  Omega-3 Fatty Acids (FISH OIL CONCENTRATE) 300 MG CAPS Take 2 g by mouth daily.    Historical Provider, MD  omeprazole (PRILOSEC) 10 MG capsule Take 10 mg  by mouth every morning.    Historical Provider, MD    Family History No family history on file.  Social History Social History  Substance Use Topics  . Smoking status: Never Smoker  . Smokeless tobacco: Never Used  . Alcohol use No     Allergies   Patient has no known allergies.   Review of Systems Review of Systems  Constitutional: Positive for fever.  Respiratory: Positive for cough.   Cardiovascular: Negative for chest pain.  Gastrointestinal: Negative for abdominal pain and vomiting.  Skin: Positive for color change and wound.  Neurological: Negative for headaches.    All other systems reviewed and are negative.    Physical Exam Updated Vital Signs BP 132/63 (BP Location: Left Arm)   Pulse 93   Temp 99.8 F (37.7 C) (Oral)   Resp 20   Ht 5\' 7"  (1.702 m)   Wt 94.3 kg   SpO2 90%   BMI 32.58 kg/m   Physical Exam CONSTITUTIONAL: Elderly, chronically ill appearing HEAD: Normocephalic/atraumatic EYES: EOMI ENMT: Mucous membranes moist NECK: supple no meningeal signs SPINE/BACK:entire spine nontender CV: S1/S2 noted, no murmurs/rubs/gallops noted LUNGS: Lungs are clear to auscultation bilaterally, no apparent distress ABDOMEN: soft, nontender, insulin pump in place NEURO: Pt is awake/alert/appropriate, No facial droop.   EXTREMITIES: the right foot is warm to touch.  There is significant erythema.  There is NO crepitus SKIN: see photo PSYCH: no abnormalities of mood noted, alert and oriented to situation        ED Treatments / Results  Labs (all labs ordered are listed, but only abnormal results are displayed) Labs Reviewed  BASIC METABOLIC PANEL - Abnormal; Notable for the following:       Result Value   Sodium 131 (*)    Chloride 98 (*)    Glucose, Bld 300 (*)    BUN 26 (*)    Creatinine, Ser 1.37 (*)    GFR calc non Af Amer 50 (*)    GFR calc Af Amer 58 (*)    All other components within normal limits  CBC WITH DIFFERENTIAL/PLATELET - Abnormal; Notable for the following:    WBC 11.2 (*)    RBC 4.07 (*)    Hemoglobin 12.5 (*)    HCT 36.3 (*)    Platelets 141 (*)    Neutro Abs 8.9 (*)    Lymphs Abs 0.5 (*)    Monocytes Absolute 1.7 (*)    All other components within normal limits    EKG  EKG Interpretation None       Radiology Dg Foot Complete Right  Result Date: 02/17/2017 CLINICAL DATA:  Initial evaluation for diabetic wound to right medial malleolus. EXAM: RIGHT FOOT COMPLETE - 3+ VIEW COMPARISON:  Prior radiograph from 10/31/2016. 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 No acute fracture. Soft tissue ulceration present at the medial aspect of the right ankle at the medial malleolus. Ulceration extends nearly to the underlying bone. Extensive periosteal reaction within this region related to the Charcot joint. Early osteomyelitis would be extremely difficult to exclude. No frank osseous erosions. Diffuse soft tissue swelling about the foot. No dissecting soft tissue emphysema. Vascular calcifications noted. IMPRESSION: 1. Soft tissue ulceration at the medial malleolus. Underlying periosteal reaction within this region related to Charcot joint. No frank osseous erosion to suggest acute osteomyelitis,  although please note this would be extremely difficult to exclude. 2. Extensive osseous destruction with disorganization about the right ankle and right mid/hindfoot, compatible with Charcot joint/diabetic neuropathy. 3. 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 4. No acute fracture or dislocation. 5. Diffuse soft tissue swelling about the foot. Electronically Signed   By: Rise Mu M.D.   On: 02/17/2017 02:56    Procedures Procedures    Medications Ordered in ED Medications  vancomycin (VANCOCIN) 1,500 mg in sodium chloride 0.9 % 500 mL IVPB (1,500 mg Intravenous New Bag/Given 02/17/17 0254)  piperacillin-tazobactam (ZOSYN) IVPB 3.375 g (0 g Intravenous Stopped 02/17/17 0244)     Initial Impression / Assessment and Plan / ED Course  I have reviewed the triage vital signs and the nursing notes.  Pertinent labs & imaging results that were available during my care of the patient were reviewed by me and considered in my medical decision making (see chart for details).     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

## 2017-02-17 NOTE — ED Triage Notes (Addendum)
Diabetic wound to right medial malleolus. Pt reports being followed at [facility] wound center by Dr Leanord Hawking Friday and started on 2 antibiotics by mouth and a/w MRI to be scheduled and culture to result. Fever started yesterday morning and they having been using Tylenol for fever control.

## 2017-02-17 NOTE — Progress Notes (Signed)
Pt has insulin pump that he manages om his own. Insulin contract signed and in chart, flowsheet wrenched in, verbal order from MD to D/C novolog.

## 2017-02-17 NOTE — Progress Notes (Signed)
Patient seen and examined. Database reviewed. No family members at bedside. Here with a fever and a right diabetic foot infection. MRI without signs for osteomyelitis. Plan to continue IV abx. Surgery to see patient in am.  Peggye Pitt, MD [facility] Pager: [phone]

## 2017-02-18 DIAGNOSIS — E08621 Diabetes mellitus due to underlying condition with foot ulcer: Secondary | ICD-10-CM

## 2017-02-18 LAB — CBC
HEMATOCRIT: 35.5 % — AB (ref 39.0–52.0)
Hemoglobin: 12.1 g/dL — ABNORMAL LOW (ref 13.0–17.0)
MCH: 30.7 pg (ref 26.0–34.0)
MCHC: 34.1 g/dL (ref 30.0–36.0)
MCV: 90.1 fL (ref 78.0–100.0)
PLATELETS: 149 10*3/uL — AB (ref 150–400)
RBC: 3.94 MIL/uL — ABNORMAL LOW (ref 4.22–5.81)
RDW: 15.2 % (ref 11.5–15.5)
WBC: 9.2 10*3/uL (ref 4.0–10.5)

## 2017-02-18 LAB — GLUCOSE, CAPILLARY
GLUCOSE-CAPILLARY: 227 mg/dL — AB (ref 65–99)
GLUCOSE-CAPILLARY: 174 mg/dL — AB (ref 65–99)
GLUCOSE-CAPILLARY: 111 mg/dL — AB (ref 65–99)
Glucose-Capillary: 299 mg/dL — ABNORMAL HIGH (ref 65–99)
Glucose-Capillary: 218 mg/dL — ABNORMAL HIGH (ref 65–99)
Glucose-Capillary: 177 mg/dL — ABNORMAL HIGH (ref 65–99)

## 2017-02-18 LAB — BASIC METABOLIC PANEL
Anion gap: 5 (ref 5–15)
BUN: 22 mg/dL — AB (ref 6–20)
CALCIUM: 8.9 mg/dL (ref 8.9–10.3)
CHLORIDE: 100 mmol/L — AB (ref 101–111)
CO2: 28 mmol/L (ref 22–32)
Creatinine, Ser: 1.28 mg/dL — ABNORMAL HIGH (ref 0.61–1.24)
GFR calc Af Amer: >60 mL/min (ref >60)
GFR calc non Af Amer: 54 mL/min — ABNORMAL LOW (ref >60)
Glucose, Bld: 230 mg/dL — ABNORMAL HIGH (ref 65–99)
Potassium: 3.7 mmol/L (ref 3.5–5.1)
SODIUM: 133 mmol/L — AB (ref 135–145)

## 2017-02-18 LAB — HEMOGLOBIN A1C
Hgb A1c MFr Bld: 7.7 % — ABNORMAL HIGH (ref 4.8–5.6)
Mean Plasma Glucose: 174 mg/dL

## 2017-02-18 MED ORDER — COLLAGENASE 250 UNIT/GM EX OINT
TOPICAL_OINTMENT | Freq: Every day | CUTANEOUS | Status: DC
  Administered 2017-02-18 – 2017-02-19 (×2): via TOPICAL
  Filled 2017-02-18: qty 30

## 2017-02-18 MED ORDER — DOXYCYCLINE HYCLATE 100 MG PO TABS
100.0000 mg | ORAL_TABLET | Freq: Two times a day (BID) | ORAL | Status: DC
  Administered 2017-02-18 – 2017-02-19 (×3): 100 mg via ORAL
  Filled 2017-02-18 (×3): qty 1

## 2017-02-18 NOTE — Consult Note (Signed)
WOC Nurse wound consult note Reason for Consult: Nonhealing neuropathic ulcer to right medial malleolus with Charcot deformity noted.  Seen at [facility] x1.  Wound type:Nonhealing neuropathic ulcer Pressure Injury POA: Yes Measurement: 2 cm x 1.8 cm x 0.2 cm ruddy red with fibrinous slough present  Wound bed:see above Drainage (amount, consistency, odor) Moderate serosanguinous Periwound:Erythema present circumferentially Dressing procedure/placement/frequency:Cleanse wound to right medial malleolus with NS and pat gently dry.  Apply Santyl to wound bed.  Cover with NS moist gauze.  Cover with 4x4 gauze and kerlix/tape.  Change daily.  Awaiting surgical consult.  Will not follow at this time.  Please re-consult if needed.  Maple Hudson RN BSN CWON Pager [phone]

## 2017-02-18 NOTE — Progress Notes (Addendum)
PT Cancellation Note  Patient Details Name: Andrew Schultz MRN: [id number] DOB: [birth year]    Reason Eval/Treat Not Completed: Other (comment) (PT orders for pulsed lavage received.  Plan for wound care PT to perform pulsed lavage and wound evaluation tomorrow morning around 8:15am.  Thank you for this consultation.)   Andrew Schultz, PT, DPT X: [phone]

## 2017-02-18 NOTE — Consult Note (Signed)
Reason for Consult: Wound, right ankle Referring Physician: Dr. Hernandez  Andrew Schultz is an 70 y.o. male.  HPI: Patient is a 70-year-old white male with a long-standing history of diabetes mellitus, peripheral vascular disease, and neuropathy who recently moved to this area from [place] last year. He has been followed by Drs. McKinney and Patel of podiatry and the [facility] wound [facility] center for care of the medial malleolar ulceration. 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 He saw the wound care center last week and had cultures done of the medial malleolar wound. Over the past 24 hours, he had increasing drainage and breakdown of this wound. He presented  Hospital for further evaluation and treatment. He denies any pain. MRI of the right foot was negative for osteomyelitis. Initially, his white blood cell count was slightly elevated, though that has normalized today. He is status post to see the wound care center in 3 days for follow-up care.  Past Medical History:  Diagnosis Date  . Apnea, sleep   . CKD (chronic kidney disease)   . Diabetes mellitus without complication (HCC)   . GERD (gastroesophageal reflux disease)   . Hyperkalemia   . Neuropathy (HCC)   . Obstructive sleep apnea on CPAP   . RA (rheumatoid arthritis) (HCC)   . Renal disorder     Past Surgical History:  Procedure Laterality Date  . ambutation toes    . CHOLECYSTECTOMY      History reviewed. No pertinent family history.  Social History:  reports that he has never smoked. He has never used smokeless tobacco. He reports that he does not drink alcohol or use drugs.  Allergies: No Known Allergies  Medications:  Prior to Admission:  Prescriptions Prior to Admission  Medication Sig Dispense Refill Last Dose  . aspirin EC 81 MG tablet Take 81 mg by mouth daily.   Past Week at Unknown time  . Cholecalciferol (VITAMIN D) 2000 units CAPS Take 1 capsule by mouth daily.    Past Week at Unknown time  .  cycloSPORINE (RESTASIS) 0.05 % ophthalmic emulsion Place 1 drop into both eyes 2 (two) times daily.   Past Week at Unknown time  . Flax OIL Take 2-3,000 mg by mouth daily.    Past Week at Unknown time  . folic acid (FOLVITE) 1 MG tablet Take 1 mg by mouth every morning.    Past Week at Unknown time  . HUMALOG 100 UNIT/ML injection 1-80 Units by Pump Prime route continuous. approx 80 units/24hrs   02/17/2017 at Unknown time  . leflunomide (ARAVA) 10 MG tablet Take 10 mg by mouth daily.   Past Week at Unknown time  . methotrexate (RHEUMATREX) 2.5 MG tablet Take 4 tablets by mouth every Sunday. (10mg total)   Past Week at Unknown time  . Multiple Vitamin (MULTIVITAMIN WITH MINERALS) TABS tablet Take 1 tablet by mouth every morning.   Past Week at Unknown time  . Omega-3 Fatty Acids (FISH OIL CONCENTRATE) 300 MG CAPS Take 2 g by mouth daily.   Past Week at Unknown time  . omeprazole (PRILOSEC) 10 MG capsule Take 10 mg by mouth every morning.   Past Week at Unknown time   Scheduled: . insulin aspart  0-15 Units Subcutaneous TID WC  . insulin aspart  0-5 Units Subcutaneous QHS  . insulin aspart  4 Units Subcutaneous TID WC  . piperacillin-tazobactam (ZOSYN)  IV  3.375 g Intravenous Q8H  . sodium chloride flush  3 mL   Intravenous Q12H  . vancomycin  1,000 mg Intravenous Q12H    Results for orders placed or performed during the hospital encounter of 02/17/17 (from the past 48 hour(s))  Basic metabolic panel     Status: Abnormal   Collection Time: 02/17/17  2:09 AM  Result Value Ref Range   Sodium 131 (L) 135 - 145 mmol/L   Potassium 4.1 3.5 - 5.1 mmol/L   Chloride 98 (L) 101 - 111 mmol/L   CO2 25 22 - 32 mmol/L   Glucose, Bld 300 (H) 65 - 99 mg/dL   BUN 26 (H) 6 - 20 mg/dL   Creatinine, Ser 1.37 (H) 0.61 - 1.24 mg/dL   Calcium 9.0 8.9 - 10.3 mg/dL   GFR calc non Af Amer 50 (L) >60 mL/min   GFR calc Af Amer 58 (L) >60 mL/min    Comment: (NOTE) The eGFR has been calculated using the CKD EPI  equation. This calculation has not been validated in all clinical situations. eGFR's persistently <60 mL/min signify possible Chronic Kidney Disease.    Anion gap 8 5 - 15  CBC with Differential/Platelet     Status: Abnormal   Collection Time: 02/17/17  2:09 AM  Result Value Ref Range   WBC 11.2 (H) 4.0 - 10.5 K/uL   RBC 4.07 (L) 4.22 - 5.81 MIL/uL   Hemoglobin 12.5 (L) 13.0 - 17.0 g/dL   HCT 36.3 (L) 39.0 - 52.0 %   MCV 89.2 78.0 - 100.0 fL   MCH 30.7 26.0 - 34.0 pg   MCHC 34.4 30.0 - 36.0 g/dL   RDW 15.2 11.5 - 15.5 %   Platelets 141 (L) 150 - 400 K/uL   Neutrophils Relative % 81 %   Neutro Abs 8.9 (H) 1.7 - 7.7 K/uL   Lymphocytes Relative 4 %   Lymphs Abs 0.5 (L) 0.7 - 4.0 K/uL   Monocytes Relative 15 %   Monocytes Absolute 1.7 (H) 0.1 - 1.0 K/uL   Eosinophils Relative 0 %   Eosinophils Absolute 0.0 0.0 - 0.7 K/uL   Basophils Relative 0 %   Basophils Absolute 0.0 0.0 - 0.1 K/uL  Hemoglobin A1c     Status: Abnormal   Collection Time: 02/17/17  2:09 AM  Result Value Ref Range   Hgb A1c MFr Bld 7.7 (H) 4.8 - 5.6 %    Comment: (NOTE)         Pre-diabetes: 5.7 - 6.4         Diabetes: >6.4         Glycemic control for adults with diabetes: <7.0    Mean Plasma Glucose 174 mg/dL    Comment: (NOTE) Performed At: [facility] [address] [place], [place] [id number] [place] William F MD Ph:[phone]   Glucose, capillary     Status: Abnormal   Collection Time: 02/17/17  4:49 AM  Result Value Ref Range   Glucose-Capillary 257 (H) 65 - 99 mg/dL  MRSA PCR Screening     Status: None   Collection Time: 02/17/17  4:55 AM  Result Value Ref Range   MRSA by PCR NEGATIVE NEGATIVE    Comment:        The GeneXpert MRSA Assay (FDA approved for NASAL specimens only), is one component of a comprehensive MRSA colonization surveillance program. It is not intended to diagnose MRSA infection nor to guide or monitor treatment for MRSA infections.   Glucose, capillary      Status: Abnormal   Collection Time: 02/17/17    8:04 AM  Result Value Ref Range   Glucose-Capillary 227 (H) 65 - 99 mg/dL  Glucose, capillary     Status: Abnormal   Collection Time: 02/17/17 11:37 AM  Result Value Ref Range   Glucose-Capillary 238 (H) 65 - 99 mg/dL  Glucose, capillary     Status: Abnormal   Collection Time: 02/17/17  8:50 PM  Result Value Ref Range   Glucose-Capillary 299 (H) 65 - 99 mg/dL   Comment 1 Notify RN    Comment 2 Document in Chart   Basic metabolic panel     Status: Abnormal   Collection Time: 02/18/17  4:44 AM  Result Value Ref Range   Sodium 133 (L) 135 - 145 mmol/L   Potassium 3.7 3.5 - 5.1 mmol/L   Chloride 100 (L) 101 - 111 mmol/L   CO2 28 22 - 32 mmol/L   Glucose, Bld 230 (H) 65 - 99 mg/dL   BUN 22 (H) 6 - 20 mg/dL   Creatinine, Ser 1.28 (H) 0.61 - 1.24 mg/dL   Calcium 8.9 8.9 - 10.3 mg/dL   GFR calc non Af Amer 54 (L) >60 mL/min   GFR calc Af Amer >60 >60 mL/min    Comment: (NOTE) The eGFR has been calculated using the CKD EPI equation. This calculation has not been validated in all clinical situations. eGFR's persistently <60 mL/min signify possible Chronic Kidney Disease.    Anion gap 5 5 - 15  CBC     Status: Abnormal   Collection Time: 02/18/17  4:44 AM  Result Value Ref Range   WBC 9.2 4.0 - 10.5 K/uL   RBC 3.94 (L) 4.22 - 5.81 MIL/uL   Hemoglobin 12.1 (L) 13.0 - 17.0 g/dL   HCT 35.5 (L) 39.0 - 52.0 %   MCV 90.1 78.0 - 100.0 fL   MCH 30.7 26.0 - 34.0 pg   MCHC 34.1 30.0 - 36.0 g/dL   RDW 15.2 11.5 - 15.5 %   Platelets 149 (L) 150 - 400 K/uL  Glucose, capillary     Status: Abnormal   Collection Time: 02/18/17  8:01 AM  Result Value Ref Range   Glucose-Capillary 174 (H) 65 - 99 mg/dL   Comment 1 Notify RN     Mr Foot Right Wo Contrast  Result Date: 02/17/2017 CLINICAL DATA:  Diabetic with medial malleolar ulceration. Chronic kidney disease. History of rheumatoid arthritis and Charcot joint. EXAM: MRI OF THE RIGHT HINDFOOT  WITHOUT CONTRAST TECHNIQUE: Multiplanar, multisequence MR imaging of the right hindfoot was performed. No intravenous contrast was administered. COMPARISON:  Radiographs 10/31/2016 and 02/17/2017. FINDINGS: Bones/Joint/Cartilage Again demonstrated are extensive changes of severe Charcot arthropathy primarily involving the ankle and hindfoot. As correlated with the patient's radiographs, there is chronic partial destruction of the distal tibia and fibula, calcaneus and navicular. The navicular is posteriorly displaced. The talus is not definitely seen, either completely destroyed or previously resected. The cuneiform bones and cuboid are partially ankylosed. Relatively mild degenerative changes are present at the Lisfranc joint. There is a large soft tissue ulcer medially over the medial malleolus. There is underlying ill-defined soft tissue T2 hyperintensity, measuring up to 2.0 x 1.3 x 1.5 cm which may reflect a small fluid collection. This abuts the medial malleolus. The medial malleolus demonstrates thick periosteal thickening, but no gross cortical destruction or focally suspicious marrow signal. There is a large, complex fluid collection at the pseudoarticulation between the distal tibia and the calcaneus, measuring up to 4.2 x 1.7 x 4.4   cm. This may communicate with the superficial fluid adjacent to the medial ulcer. Ligaments The ankle ligaments are poorly visualized. The Lisfranc ligament is intact. Muscles and Tendons There is a well-marginated 2.3 cm defect in the Achilles tendon which may be postsurgical given adjacent susceptibility artifact. There is distal Achilles tendinosis. The additional ankle tendons appear intact. There is a small amount of fluid within the flexor tendon sheaths medially. In the plantar aspect of the forefoot, the flexor hallucis longus tendon appears to have been released. Soft tissues There is generalized subcutaneous edema surrounding the ankle. As above, there is a focal soft  tissue defect overlying the medial malleolus with underlying ill-defined T2 hyperintensity. There are multiple ill-defined complex fluid collection surrounding the Charcot joint. IMPRESSION: 1. Severe Charcot arthropathy, primarily involving the ankle and hindfoot. Multifocal bone destruction appears long-standing, grossly stable from radiographs 10/31/2016. 2. Large soft tissue ulcer overlying the medial malleolus. There is underlying complex fluid which may be contiguous with fluid and synovitis in the pseudoarthrosis between the distal tibia and calcaneus. It is not possible to exclude superimposed infection by this examination. 3. No findings highly suspicious for superimposed osteomyelitis. Again, given the extensive Charcot changes, that cannot be definitively excluded by this examination. 4. Presumed previous surgical release of the Achilles and flexor hallucis longus tendons. Electronically Signed   By: Richardean Sale M.D.   On: 02/17/2017 10:51   Dg Foot Complete Right  Result Date: 02/17/2017 CLINICAL DATA:  Initial evaluation for diabetic wound to right medial malleolus. EXAM: RIGHT FOOT COMPLETE - 3+ VIEW COMPARISON:  Prior radiograph from 10/31/2016. 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 No acute fracture. Soft tissue ulceration present at the medial aspect of the right ankle at the medial malleolus. Ulceration extends nearly to the underlying bone. Extensive periosteal reaction within this region related to the Charcot joint. Early osteomyelitis would be extremely difficult to exclude. No frank osseous erosions. Diffuse soft tissue swelling about the foot. No dissecting soft tissue emphysema. Vascular calcifications noted. IMPRESSION: 1. Soft tissue ulceration at the medial malleolus. Underlying periosteal  reaction within this region related to Charcot joint. No frank osseous erosion to suggest acute osteomyelitis, although please note this would be extremely difficult to exclude. 2. Extensive osseous destruction with disorganization about the right ankle and right mid/hindfoot, compatible with Charcot joint/diabetic neuropathy. 3. 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 4. No acute fracture or dislocation. 5. Diffuse soft tissue swelling about the foot. Electronically Signed   By: Jeannine Boga M.D.   On: 02/17/2017 02:56    ROS:  Pertinent items noted in HPI and remainder of comprehensive ROS otherwise negative.  Blood pressure (!) 105/41, pulse 74, temperature 98.6 F (37 C), temperature source Oral, resp. rate 16, height 5' 7.5" (1.715 m), weight 207 lb 14.3 oz (94.3 kg), SpO2 97 %. Physical Exam: Pleasant well-developed well-nourished white male in no acute distress. Extremity examination reveals a deformed right foot due to Charcot's disease, partial toe amputations, a 2-3 cm draining medial malleolar wound with some necrotic soft tissue present over the bone. No bone is exposed, but is easily palpable. Surrounding brawny skin and chronic ischemic skin changes are noted. No purulent drainage is noted from the wound. A palpable dorsalis pedis pulse is palpable.  Assessment/Plan: Impression: Diabetic foot ulcer, peripheral vascular  disease, Charcot's disease, peripheral neuropathy. As patient is being followed closely by podiatry in the wound care center, there is not much further I can offer at this time. I think he would benefit from some limited pulse lavage therapy to the wound. He is supposed to follow-up with the wound care center in 3 days. He apparently has some culture results are pending, and I will check these tomorrow.  Aviva Signs 02/18/2017, 11:54 AM

## 2017-02-18 NOTE — Progress Notes (Addendum)
Inpatient Diabetes Program Recommendations  AACE/ADA: New Consensus Statement on Inpatient Glycemic Control (2015)  Target Ranges:  Prepandial:   less than 140 mg/dL      Peak postprandial:   less than 180 mg/dL (1-2 hours)      Critically ill patients:  140 - 180 mg/dL   Lab Results  Component Value Date   GLUCAP 218 (H) 02/18/2017   HGBA1C 7.7 (H) 02/17/2017    Diabetes history: DM Outpatient Diabetes medications: Patient states the following pump settings:  1.7 units per hour from 0800 to 2400  1.8 units per hour from 0000-0800  Carb Ratio of 1 unit per 5 G of carb's  Sensitivity of 1 units drops glucose 10 mg/dL  Current orders for Inpatient glycemic control: moderate correction scale Novolog 0-15 units TIDAC and 0-5 units QHS, meal coverage of Novolog 4 units TIDAC if patient eats > 50% of meal  However - patient is not receiving SQ insulin, but instead has his pump running.   Inpatient Diabetes Program Recommendations:  Please consider ordering for patient the insulin pump order set orders and canceling current insulin orders.  Patient just moved to this area and plans to see Dr. Margo Aye.  Patient has been on insulin pump for 20 years.  Patient was admitted on insulin pump which he has been managing since admission.   Thank you,  Kristine Linea, RN, MSN Diabetes Coordinator Inpatient Diabetes Program [phone] (Team Pager)

## 2017-02-18 NOTE — Progress Notes (Signed)
PROGRESS NOTE    Andrew Schultz  XBJ:[id number] DOB: 1946-03-05 DOA: 02/17/2017 PCP: Dwana Melena, MD     Brief Narrative:  71 y/o man admitted from a right diabetic foot ulcer and fever.   Assessment & Plan:   Principal Problem:   Diabetic foot ulcer (HCC) Active Problems:   RA (rheumatoid arthritis) (HCC)   Obstructive sleep apnea on CPAP   Neuropathy (HCC)   Diabetic Foot Ulcer -Appreciate surgery consultation: pulse lavage requested. -Will taper abx to PO doxy. -As long as afebrile in am, will DC home. -Already has f/u scheduled at the wound care center.   DVT prophylaxis: SCDs Code Status: full code Family Communication: patient only Disposition Plan: likely home in 24 hours  Consultants:   Surgery, Dr. Lovell Sheehan  Procedures:   None  Antimicrobials:  Anti-infectives    Start     Dose/Rate Route Frequency Ordered Stop   02/18/17 1345  doxycycline (VIBRA-TABS) tablet 100 mg     100 mg Oral Every 12 hours 02/18/17 1342     02/17/17 1600  vancomycin (VANCOCIN) IVPB 1000 mg/200 mL premix  Status:  Discontinued     1,000 mg 200 mL/hr over 60 Minutes Intravenous Every 12 hours 02/17/17 0726 02/18/17 1342   02/17/17 1000  piperacillin-tazobactam (ZOSYN) IVPB 3.375 g  Status:  Discontinued     3.375 g 12.5 mL/hr over 240 Minutes Intravenous Every 8 hours 02/17/17 0726 02/18/17 1342   02/17/17 0215  vancomycin (VANCOCIN) 1,500 mg in sodium chloride 0.9 % 500 mL IVPB     1,500 mg 250 mL/hr over 120 Minutes Intravenous  Once 02/17/17 0206 02/17/17 0454   02/17/17 0215  piperacillin-tazobactam (ZOSYN) IVPB 3.375 g     3.375 g 100 mL/hr over 30 Minutes Intravenous  Once 02/17/17 0206 02/17/17 0244       Subjective: Feels much improved  Objective: Vitals:   02/17/17 2118 02/17/17 2315 02/18/17 0413 02/18/17 1300  BP: (!) 136/56  (!) 105/41 (!) 117/46  Pulse: 84  74 79  Resp: 16  16   Temp: (!) 100.7 F (38.2 C) 99.7 F (37.6 C) 98.6 F (37 C) 98.3 F (36.8 C)   TempSrc: Oral Oral Oral Oral  SpO2: 94%  97% 99%  Weight:      Height:        Intake/Output Summary (Last 24 hours) at 02/18/17 1834 Last data filed at 02/18/17 1300  Gross per 24 hour  Intake              160 ml  Output             2050 ml  Net            -1890 ml   Filed Weights   02/17/17 0138 02/17/17 0419  Weight: 94.3 kg (208 lb) 94.3 kg (207 lb 14.3 oz)    Examination:  General exam: Alert, awake, oriented x 3 Respiratory system: Clear to auscultation. Respiratory effort normal. Cardiovascular system:RRR. No murmurs, rubs, gallops. Gastrointestinal system: Abdomen is nondistended, soft and nontender. No organomegaly or masses felt. Normal bowel sounds heard. Central nervous system: Alert and oriented. No focal neurological deficits. Extremities: right foot ulcer in clean dressing Skin: No rashes, lesions or ulcers Psychiatry: Judgement and insight appear normal. Mood & affect appropriate.     Data Reviewed: I have personally reviewed following labs and imaging studies  CBC:  Recent Labs Lab 02/17/17 0209 02/18/17 0444  WBC 11.2* 9.2  NEUTROABS 8.9*  --  HGB 12.5* 12.1*  HCT 36.3* 35.5*  MCV 89.2 90.1  PLT 141* 149*   Basic Metabolic Panel:  Recent Labs Lab 02/17/17 0209 02/18/17 0444  NA 131* 133*  K 4.1 3.7  CL 98* 100*  CO2 25 28  GLUCOSE 300* 230*  BUN 26* 22*  CREATININE 1.37* 1.28*  CALCIUM 9.0 8.9   GFR: Estimated Creatinine Clearance: 57.6 mL/min (A) (by C-G formula based on SCr of 1.28 mg/dL (H)). Liver Function Tests: No results for input(s): AST, ALT, ALKPHOS, BILITOT, PROT, ALBUMIN in the last 168 hours. No results for input(s): LIPASE, AMYLASE in the last 168 hours. No results for input(s): AMMONIA in the last 168 hours. Coagulation Profile: No results for input(s): INR, PROTIME in the last 168 hours. Cardiac Enzymes: No results for input(s): CKTOTAL, CKMB, CKMBINDEX, TROPONINI in the last 168 hours. BNP (last 3 results) No  results for input(s): PROBNP in the last 8760 hours. HbA1C:  Recent Labs  02/17/17 0209  HGBA1C 7.7*   CBG:  Recent Labs Lab 02/17/17 1137 02/17/17 2050 02/18/17 0801 02/18/17 1154 02/18/17 1651  GLUCAP 238* 299* 174* 218* 177*   Lipid Profile: No results for input(s): CHOL, HDL, LDLCALC, TRIG, CHOLHDL, LDLDIRECT in the last 72 hours. Thyroid Function Tests: No results for input(s): TSH, T4TOTAL, FREET4, T3FREE, THYROIDAB in the last 72 hours. Anemia Panel: No results for input(s): VITAMINB12, FOLATE, FERRITIN, TIBC, IRON, RETICCTPCT in the last 72 hours. Urine analysis:    Component Value Date/Time   COLORURINE YELLOW 12/14/2016 1330   APPEARANCEUR CLOUDY (A) 12/14/2016 1330   LABSPEC >1.030 (H) 12/14/2016 1330   PHURINE 5.5 12/14/2016 1330   GLUCOSEU 250 (A) 12/14/2016 1330   HGBUR NEGATIVE 12/14/2016 1330   BILIRUBINUR NEGATIVE 12/14/2016 1330   KETONESUR NEGATIVE 12/14/2016 1330   PROTEINUR NEGATIVE 12/14/2016 1330   NITRITE NEGATIVE 12/14/2016 1330   LEUKOCYTESUR NEGATIVE 12/14/2016 1330   Sepsis Labs: @LABRCNTIP (procalcitonin:4,lacticidven:4)  ) Recent Results (from the past 240 hour(s))  MRSA PCR Screening     Status: None   Collection Time: 02/17/17  4:55 AM  Result Value Ref Range Status   MRSA by PCR NEGATIVE NEGATIVE Final    Comment:        The GeneXpert MRSA Assay (FDA approved for NASAL specimens only), is one component of a comprehensive MRSA colonization surveillance program. It is not intended to diagnose MRSA infection nor to guide or monitor treatment for MRSA infections.          Radiology Studies: Mr Foot Right Wo Contrast  Result Date: 02/17/2017 CLINICAL DATA:  Diabetic with medial malleolar ulceration. Chronic kidney disease. History of rheumatoid arthritis and Charcot joint. EXAM: MRI OF THE RIGHT HINDFOOT WITHOUT CONTRAST TECHNIQUE: Multiplanar, multisequence MR imaging of the right hindfoot was performed. No intravenous  contrast was administered. COMPARISON:  Radiographs 10/31/2016 and 02/17/2017. FINDINGS: Bones/Joint/Cartilage Again demonstrated are extensive changes of severe Charcot arthropathy primarily involving the ankle and hindfoot. As correlated with the patient's radiographs, there is chronic partial destruction of the distal tibia and fibula, calcaneus and navicular. The navicular is posteriorly displaced. The talus is not definitely seen, either completely destroyed or previously resected. The cuneiform bones and cuboid are partially ankylosed. Relatively mild degenerative changes are present at the Lisfranc joint. There is a large soft tissue ulcer medially over the medial malleolus. There is underlying ill-defined soft tissue T2 hyperintensity, measuring up to 2.0 x 1.3 x 1.5 cm which may reflect a small fluid collection. This abuts the medial  malleolus. The medial malleolus demonstrates thick periosteal thickening, but no gross cortical destruction or focally suspicious marrow signal. There is a large, complex fluid collection at the pseudoarticulation between the distal tibia and the calcaneus, measuring up to 4.2 x 1.7 x 4.4 cm. This may communicate with the superficial fluid adjacent to the medial ulcer. Ligaments The ankle ligaments are poorly visualized. The Lisfranc ligament is intact. Muscles and Tendons There is a well-marginated 2.3 cm defect in the Achilles tendon which may be postsurgical given adjacent susceptibility artifact. There is distal Achilles tendinosis. The additional ankle tendons appear intact. There is a small amount of fluid within the flexor tendon sheaths medially. In the plantar aspect of the forefoot, the flexor hallucis longus tendon appears to have been released. Soft tissues There is generalized subcutaneous edema surrounding the ankle. As above, there is a focal soft tissue defect overlying the medial malleolus with underlying ill-defined T2 hyperintensity. There are multiple  ill-defined complex fluid collection surrounding the Charcot joint. IMPRESSION: 1. Severe Charcot arthropathy, primarily involving the ankle and hindfoot. Multifocal bone destruction appears long-standing, grossly stable from radiographs 10/31/2016. 2. Large soft tissue ulcer overlying the medial malleolus. There is underlying complex fluid which may be contiguous with fluid and synovitis in the pseudoarthrosis between the distal tibia and calcaneus. It is not possible to exclude superimposed infection by this examination. 3. No findings highly suspicious for superimposed osteomyelitis. Again, given the extensive Charcot changes, that cannot be definitively excluded by this examination. 4. Presumed previous surgical release of the Achilles and flexor hallucis longus tendons. Electronically Signed   By: Carey Bullocks M.D.   On: 02/17/2017 10:51   Dg Foot Complete Right  Result Date: 02/17/2017 CLINICAL DATA:  Initial evaluation for diabetic wound to right medial malleolus. EXAM: RIGHT FOOT COMPLETE - 3+ VIEW COMPARISON:  Prior radiograph from 10/31/2016. 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 No acute fracture. Soft tissue ulceration present at the medial aspect of the right ankle at the medial malleolus. Ulceration extends nearly to the underlying bone. Extensive periosteal reaction within this region related to the Charcot joint. Early osteomyelitis would be extremely difficult to exclude. No frank osseous erosions. Diffuse soft tissue swelling about the foot. No dissecting soft tissue emphysema. Vascular calcifications noted. IMPRESSION: 1. Soft tissue ulceration at the medial malleolus. Underlying periosteal reaction within this region related to Charcot joint. No frank osseous erosion to suggest acute osteomyelitis,  although please note this would be extremely difficult to exclude. 2. Extensive osseous destruction with disorganization about the right ankle and right mid/hindfoot, compatible with Charcot joint/diabetic neuropathy. 3. 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 4. No acute fracture or dislocation. 5. Diffuse soft tissue swelling about the foot. Electronically Signed   By: Rise Mu M.D.   On: 02/17/2017 02:56        Scheduled Meds: . collagenase   Topical Daily  . doxycycline  100 mg Oral Q12H  . sodium chloride flush  3 mL Intravenous Q12H   Continuous Infusions:   LOS: 1 day    Time spent: 25 minutes. Greater than 50% of this time was spent in direct contact with the patient coordinating care.     Chaya Jan, MD [facility] Pager [phone]  If 7PM-7AM, please contact night-coverage [email or web address] Password [facility] 02/18/2017, 6:34 PM

## 2017-02-19 DIAGNOSIS — L97402 Non-pressure chronic ulcer of unspecified heel and midfoot with fat layer exposed: Secondary | ICD-10-CM

## 2017-02-19 LAB — CBC
HEMATOCRIT: 36.7 % — AB (ref 39.0–52.0)
HEMOGLOBIN: 12.6 g/dL — AB (ref 13.0–17.0)
MCH: 30.9 pg (ref 26.0–34.0)
MCHC: 34.3 g/dL (ref 30.0–36.0)
MCV: 90 fL (ref 78.0–100.0)
Platelets: 187 10*3/uL (ref 150–400)
RBC: 4.08 MIL/uL — AB (ref 4.22–5.81)
RDW: 15.1 % (ref 11.5–15.5)
WBC: 8.4 10*3/uL (ref 4.0–10.5)

## 2017-02-19 LAB — GLUCOSE, CAPILLARY
GLUCOSE-CAPILLARY: 60 mg/dL — AB (ref 65–99)
GLUCOSE-CAPILLARY: 198 mg/dL — AB (ref 65–99)

## 2017-02-19 MED ORDER — DOXYCYCLINE HYCLATE 100 MG PO TABS: 100.0000 mg | ORAL_TABLET | Freq: Two times a day (BID) | ORAL | 0 refills | Status: DC

## 2017-02-19 MED ORDER — INSULIN PUMP
Freq: Three times a day (TID) | SUBCUTANEOUS | Status: DC
  Administered 2017-02-19 (×2): 1 via SUBCUTANEOUS
  Filled 2017-02-19: qty 1

## 2017-02-19 NOTE — Progress Notes (Signed)
Subjective: No complaints of pain.  Objective: Vital signs in last 24 hours: Temp:  [98.2 F (36.8 C)-100.5 F (38.1 C)] 98.2 F (36.8 C) (03/27 0500) Pulse Rate:  [73-79] 73 (03/27 0500) Resp:  [18] 18 (03/27 0500) BP: (117-131)/(46-57) 131/57 (03/27 0500) SpO2:  [95 %-99 %] 97 % (03/27 0500) Last BM Date: 02/17/17  Intake/Output from previous day: 03/26 0701 - 03/27 0700 In: 60 [I.V.:10; IV Piggyback:50] Out: 2225 [Urine:2225] Intake/Output this shift: No intake/output data recorded.  General appearance: alert, cooperative and no distress Extremities: Dressing in place. Scheduled to have pulse lavage therapy shortly.  Lab Results:   Recent Labs  02/18/17 0444 02/19/17 0518  WBC 9.2 8.4  HGB 12.1* 12.6*  HCT 35.5* 36.7*  PLT 149* 187   BMET  Recent Labs  02/17/17 0209 02/18/17 0444  NA 131* 133*  K 4.1 3.7  CL 98* 100*  CO2 25 28  GLUCOSE 300* 230*  BUN 26* 22*  CREATININE 1.37* 1.28*  CALCIUM 9.0 8.9   PT/INR No results for input(s): LABPROT, INR in the last 72 hours.  Studies/Results: Mr Foot Right Wo Contrast  Result Date: 02/17/2017 CLINICAL DATA:  Diabetic with medial malleolar ulceration. Chronic kidney disease. History of rheumatoid arthritis and Charcot joint. EXAM: MRI OF THE RIGHT HINDFOOT WITHOUT CONTRAST TECHNIQUE: Multiplanar, multisequence MR imaging of the right hindfoot was performed. No intravenous contrast was administered. COMPARISON:  Radiographs 10/31/2016 and 02/17/2017. FINDINGS: Bones/Joint/Cartilage Again demonstrated are extensive changes of severe Charcot arthropathy primarily involving the ankle and hindfoot. As correlated with the patient's radiographs, there is chronic partial destruction of the distal tibia and fibula, calcaneus and navicular. The navicular is posteriorly displaced. The talus is not definitely seen, either completely destroyed or previously resected. The cuneiform bones and cuboid are partially ankylosed.  Relatively mild degenerative changes are present at the Lisfranc joint. There is a large soft tissue ulcer medially over the medial malleolus. There is underlying ill-defined soft tissue T2 hyperintensity, measuring up to 2.0 x 1.3 x 1.5 cm which may reflect a small fluid collection. This abuts the medial malleolus. The medial malleolus demonstrates thick periosteal thickening, but no gross cortical destruction or focally suspicious marrow signal. There is a large, complex fluid collection at the pseudoarticulation between the distal tibia and the calcaneus, measuring up to 4.2 x 1.7 x 4.4 cm. This may communicate with the superficial fluid adjacent to the medial ulcer. Ligaments The ankle ligaments are poorly visualized. The Lisfranc ligament is intact. Muscles and Tendons There is a well-marginated 2.3 cm defect in the Achilles tendon which may be postsurgical given adjacent susceptibility artifact. There is distal Achilles tendinosis. The additional ankle tendons appear intact. There is a small amount of fluid within the flexor tendon sheaths medially. In the plantar aspect of the forefoot, the flexor hallucis longus tendon appears to have been released. Soft tissues There is generalized subcutaneous edema surrounding the ankle. As above, there is a focal soft tissue defect overlying the medial malleolus with underlying ill-defined T2 hyperintensity. There are multiple ill-defined complex fluid collection surrounding the Charcot joint. IMPRESSION: 1. Severe Charcot arthropathy, primarily involving the ankle and hindfoot. Multifocal bone destruction appears long-standing, grossly stable from radiographs 10/31/2016. 2. Large soft tissue ulcer overlying the medial malleolus. There is underlying complex fluid which may be contiguous with fluid and synovitis in the pseudoarthrosis between the distal tibia and calcaneus. It is not possible to exclude superimposed infection by this examination. 3. No findings highly  suspicious for  superimposed osteomyelitis. Again, given the extensive Charcot changes, that cannot be definitively excluded by this examination. 4. Presumed previous surgical release of the Achilles and flexor hallucis longus tendons. Electronically Signed   By: Carey Bullocks M.D.   On: 02/17/2017 10:51    Anti-infectives: Anti-infectives    Start     Dose/Rate Route Frequency Ordered Stop   02/18/17 1345  doxycycline (VIBRA-TABS) tablet 100 mg     100 mg Oral Every 12 hours 02/18/17 1342     02/17/17 1600  vancomycin (VANCOCIN) IVPB 1000 mg/200 mL premix  Status:  Discontinued     1,000 mg 200 mL/hr over 60 Minutes Intravenous Every 12 hours 02/17/17 0726 02/18/17 1342   02/17/17 1000  piperacillin-tazobactam (ZOSYN) IVPB 3.375 g  Status:  Discontinued     3.375 g 12.5 mL/hr over 240 Minutes Intravenous Every 8 hours 02/17/17 0726 02/18/17 1342   02/17/17 0215  vancomycin (VANCOCIN) 1,500 mg in sodium chloride 0.9 % 500 mL IVPB     1,500 mg 250 mL/hr over 120 Minutes Intravenous  Once 02/17/17 0206 02/17/17 0454   02/17/17 0215  piperacillin-tazobactam (ZOSYN) IVPB 3.375 g     3.375 g 100 mL/hr over 30 Minutes Intravenous  Once 02/17/17 0206 02/17/17 0244      Assessment/Plan: Impression: Diabetic foot ulceration, right foot. Patient states he had a culture of the wound done last week at the [facility] wound center. I could not find record of this. Patient has a follow-up appointment with them in 2 days. Okay for discharge from surgery standpoint. Would send him home on doxycycline.  LOS: 2 days    Franky Macho 02/19/2017

## 2017-02-19 NOTE — Care Management Note (Signed)
Case Management Note  Patient Details  Name: Andrew Schultz MRN: [id number] Date of Birth: [birth year]  Subjective/Objective:                  Pt admitted with diabetic foot ulcer. Pt is from home with family. He has PCP, transportation to appointments and has insurance with drug coverage and no difficulty affording or managing his own medications. He goes to the wound center in [place], he has appointment on Thursday.   Action/Plan: Pt plans to DC home today with OP wound care. No CM needs communicated.  Expected Discharge Date:     02/19/2017             Expected Discharge Plan:  Home/Self Care  In-House Referral:  NA  Discharge planning Services  CM Consult  Post Acute Care Choice:  NA Choice offered to:  NA  Status of Service:  Completed, signed off  Malcolm Metro, RN 02/19/2017, 9:47 AM

## 2017-02-19 NOTE — Care Management Important Message (Signed)
Important Message  Patient Details  Name: Andrew Schultz MRN: [id number] Date of Birth: 02/04/1946   Medicare Important Message Given:  Yes    Malcolm Metro, RN 02/19/2017, 9:50 AM

## 2017-02-19 NOTE — Discharge Summary (Signed)
Physician Discharge Summary  Andrew Schultz JGG:[id number] DOB: 05/07/1946 DOA: 02/17/2017  PCP: Andrew Melena, MD  Admit date: 02/17/2017 Discharge date: 02/19/2017  Time spent: 45 minutes  Recommendations for Outpatient Follow-up:  -Will be discharged home today. -Has follow up with the wound clinic in 3 days.  Discharge Diagnoses:  Principal Problem:   Diabetic foot ulcer (HCC) Active Problems:   RA (rheumatoid arthritis) (HCC)   Obstructive sleep apnea on CPAP   Neuropathy (HCC)   Discharge Condition: Stable and improved  Filed Weights   02/17/17 0138 02/17/17 0419  Weight: 94.3 kg (208 lb) 94.3 kg (207 lb 14.3 oz)    History of present illness:  As per Dr. Onalee Hua on 3/25:  Andrew Schultz is a 71 y.o. 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  Pt reports he has been going to the wound clinic and it has been healing well until Thursday when it started draining and getting red.  He went to wound clinic Friday who started him on oral augmentin and arranged for an outpt mri to be done.  It got worse so he came to the ED.  Reports fever.  No chills.  No n/v/d.  Hospital Course:   Diabetic Foot Ulcer -Appreciate surgery consultation: pulse lavage requested. -Continue PO doxy for 10 days. -Already has f/u scheduled at the wound care center.  Procedures:  Pulse lavage   Consultations:  Surgery, Dr. Lovell Sheehan  Discharge Instructions  Discharge Instructions    Diet - low sodium heart healthy    Complete by:  As directed    Increase activity slowly    Complete by:  As directed      Allergies as of 02/19/2017   No Known Allergies     Medication List    TAKE these medications   aspirin EC 81 MG tablet Take 81 mg by mouth daily.   doxycycline 100 MG tablet Commonly known as:  VIBRA-TABS Take 1 tablet (100 mg total) by mouth every 12 (twelve) hours.     FISH OIL CONCENTRATE 300 MG Caps Take 2 g by mouth daily.   Flax Oil Take 2-3,000 mg by mouth daily.   folic acid 1 MG tablet Commonly known as:  FOLVITE Take 1 mg by mouth every morning.   HUMALOG 100 UNIT/ML injection Generic drug:  insulin lispro 1-80 Units by Pump Prime route continuous. approx 80 units/24hrs   leflunomide 10 MG tablet Commonly known as:  ARAVA Take 10 mg by mouth daily.   methotrexate 2.5 MG tablet Commonly known as:  RHEUMATREX Take 4 tablets by mouth every Sunday. (10mg  total)   multivitamin with minerals Tabs tablet Take 1 tablet by mouth every morning.   omeprazole 10 MG capsule Commonly known as:  PRILOSEC Take 10 mg by mouth every morning.   RESTASIS 0.05 % ophthalmic emulsion Generic drug:  cycloSPORINE Place 1 drop into both eyes 2 (two) times daily.   Vitamin D 2000 units Caps Take 1 capsule by mouth daily.      No Known Allergies Follow-up Information    , MD. Schedule an appointment as soon as possible for a visit in 2 week(s).   Specialty:  Internal Medicine Contact information: [address] [place] [phone]            The results of significant diagnostics from this hospitalization (including  imaging, microbiology, ancillary and laboratory) are listed below for reference.    Significant Diagnostic Studies: Mr Foot Right Wo Contrast  Result Date: 02/17/2017 CLINICAL DATA:  Diabetic with medial malleolar ulceration. Chronic kidney disease. History of rheumatoid arthritis and Charcot joint. EXAM: MRI OF THE RIGHT HINDFOOT WITHOUT CONTRAST TECHNIQUE: Multiplanar, multisequence MR imaging of the right hindfoot was performed. No intravenous contrast was administered. COMPARISON:  Radiographs 10/31/2016 and 02/17/2017. FINDINGS: Bones/Joint/Cartilage Again demonstrated are extensive changes of severe Charcot arthropathy primarily involving the ankle and hindfoot. As correlated with the patient's  radiographs, there is chronic partial destruction of the distal tibia and fibula, calcaneus and navicular. The navicular is posteriorly displaced. The talus is not definitely seen, either completely destroyed or previously resected. The cuneiform bones and cuboid are partially ankylosed. Relatively mild degenerative changes are present at the Lisfranc joint. There is a large soft tissue ulcer medially over the medial malleolus. There is underlying ill-defined soft tissue T2 hyperintensity, measuring up to 2.0 x 1.3 x 1.5 cm which may reflect a small fluid collection. This abuts the medial malleolus. The medial malleolus demonstrates thick periosteal thickening, but no gross cortical destruction or focally suspicious marrow signal. There is a large, complex fluid collection at the pseudoarticulation between the distal tibia and the calcaneus, measuring up to 4.2 x 1.7 x 4.4 cm. This may communicate with the superficial fluid adjacent to the medial ulcer. Ligaments The ankle ligaments are poorly visualized. The Lisfranc ligament is intact. Muscles and Tendons There is a well-marginated 2.3 cm defect in the Achilles tendon which may be postsurgical given adjacent susceptibility artifact. There is distal Achilles tendinosis. The additional ankle tendons appear intact. There is a small amount of fluid within the flexor tendon sheaths medially. In the plantar aspect of the forefoot, the flexor hallucis longus tendon appears to have been released. Soft tissues There is generalized subcutaneous edema surrounding the ankle. As above, there is a focal soft tissue defect overlying the medial malleolus with underlying ill-defined T2 hyperintensity. There are multiple ill-defined complex fluid collection surrounding the Charcot joint. IMPRESSION: 1. Severe Charcot arthropathy, primarily involving the ankle and hindfoot. Multifocal bone destruction appears long-standing, grossly stable from radiographs 10/31/2016. 2. Large soft  tissue ulcer overlying the medial malleolus. There is underlying complex fluid which may be contiguous with fluid and synovitis in the pseudoarthrosis between the distal tibia and calcaneus. It is not possible to exclude superimposed infection by this examination. 3. No findings highly suspicious for superimposed osteomyelitis. Again, given the extensive Charcot changes, that cannot be definitively excluded by this examination. 4. Presumed previous surgical release of the Achilles and flexor hallucis longus tendons. Electronically Signed   By: Carey Bullocks M.D.   On: 02/17/2017 10:51   Dg Foot Complete Right  Result Date: 02/17/2017 CLINICAL DATA:  Initial evaluation for diabetic wound to right medial malleolus. EXAM: RIGHT FOOT COMPLETE - 3+ VIEW COMPARISON:  Prior radiograph from 10/31/2016. 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 No acute fracture. Soft tissue ulceration present at the medial aspect of the right ankle at the medial malleolus. Ulceration extends nearly to the underlying bone. Extensive periosteal reaction within this region related to the Charcot joint. Early osteomyelitis would be extremely difficult to exclude. No frank osseous erosions. Diffuse soft tissue swelling about the foot. No dissecting soft tissue emphysema. Vascular calcifications  noted. IMPRESSION: 1. Soft tissue ulceration at the medial malleolus. Underlying periosteal reaction within this region related to Charcot joint. No frank osseous erosion to suggest acute osteomyelitis, although please note this would be extremely difficult to exclude. 2. Extensive osseous destruction with disorganization about the right ankle and right mid/hindfoot, compatible with Charcot joint/diabetic neuropathy. 3. 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 4. No acute fracture or dislocation. 5. Diffuse soft tissue swelling about the foot. Electronically Signed   By: Rise Mu M.D.   On: 02/17/2017 02:56    Microbiology: Recent Results (from the past 240 hour(s))  MRSA PCR Screening     Status: None   Collection Time: 02/17/17  4:55 AM  Result Value Ref Range Status   MRSA by PCR NEGATIVE NEGATIVE Final    Comment:        The GeneXpert MRSA Assay (FDA approved for NASAL specimens only), is one component of a comprehensive MRSA colonization surveillance program. It is not intended to diagnose MRSA infection nor to guide or monitor treatment for MRSA infections.      Labs: Basic Metabolic Panel:  Recent Labs Lab 02/17/17 0209 02/18/17 0444  NA 131* 133*  K 4.1 3.7  CL 98* 100*  CO2 25 28  GLUCOSE 300* 230*  BUN 26* 22*  CREATININE 1.37* 1.28*  CALCIUM 9.0 8.9   Liver Function Tests: No results for input(s): AST, ALT, ALKPHOS, BILITOT, PROT, ALBUMIN in the last 168 hours. No results for input(s): LIPASE, AMYLASE in the last 168 hours. No results for input(s): AMMONIA in the last 168 hours. CBC:  Recent Labs Lab 02/17/17 0209 02/18/17 0444 02/19/17 0518  WBC 11.2* 9.2 8.4  NEUTROABS 8.9*  --   --   HGB 12.5* 12.1* 12.6*  HCT 36.3* 35.5* 36.7*  MCV 89.2 90.1 90.0  PLT 141* 149* 187   Cardiac Enzymes: No results for input(s): CKTOTAL, CKMB, CKMBINDEX, TROPONINI in the last 168 hours. BNP: BNP (last 3 results) No results for input(s): BNP in the last 8760 hours.  ProBNP (last 3 results) No results for input(s): PROBNP in the last 8760 hours.  CBG:  Recent Labs Lab 02/18/17 1154 02/18/17 1651 02/18/17 2126 02/19/17 0755 02/19/17 1128  GLUCAP 218* 177* 111* 60* 198*       Signed:  HERNANDEZ ACOSTA,Calee Nugent  [facility] Pager: [phone] 02/19/2017, 4:02 PM

## 2017-02-19 NOTE — Progress Notes (Signed)
PAtient discharged home. With IV removed and site intact. PAtient discharged with prescription and all personal belongings.

## 2017-02-19 NOTE — Progress Notes (Signed)
Inpatient Diabetes Program Recommendations  AACE/ADA: New Consensus Statement on Inpatient Glycemic Control (2015)  Target Ranges:  Prepandial:   less than 140 mg/dL      Peak postprandial:   less than 180 mg/dL (1-2 hours)      Critically ill patients:  140 - 180 mg/dL   Results for Andrew Schultz, Andrew Schultz (MRN [id number]) as of 02/19/2017 08:35  Ref. Range 02/17/2017 08:04 02/17/2017 20:50 02/18/2017 11:54 02/18/2017 16:51 02/18/2017 21:26  Glucose-Capillary Latest Ref Range: 65 - 99 mg/dL 056 (H) 979 (H) 480 (H) 177 (H) 111 (H)   Review of Glycemic Control  Diabetes history: DM2 Outpatient Diabetes medications: Insulin Pump Current orders for Inpatient glycemic control: using Insulin pump but order set not ordered  Inpatient Diabetes Program Recommendations:  Insulin Pump: Patient is using his insulin pump while inpatient for glycemic control. If MD is allowing patient to use an insulin pump while inpatient, insulin pump order set has to be ordered. Glucose is still checked with hospital glucometer and nursing has to document information for insulin pump and how much insulin is being administered with insulin pump.  Placed orders for insulin pump order set (per protocol; cosign required).  NURSING: If note already done, please print off the Patient insulin pump contract and flow sheet. The insulin pump contract should be signed by the patient and then placed in the chart. The patient insulin pump flow sheet will be completed by the patient at the bedside and the RN caring for the patient will use the patient's flow sheet to document in the [facility]. RN will need to complete the Nursing Insulin Pump Flowsheet at least once a shift. Patient will need to keep extra insulin pump supplies at the bedside at all times. Will continue to follow along.  Thanks, Orlando Penner, RN, MSN, CDE Diabetes Coordinator Inpatient Diabetes Program [phone] (Team Pager from 8am to 5pm)

## 2017-02-19 NOTE — Evaluation (Signed)
Physical Therapy Evaluation Patient Details Name: Andrew Schultz MRN: [id number] DOB: 1946-07-09 Today's Date: 02/19/2017   History of Present Illness  Kvon Mcilhenny is a 71 y.o. 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  Pt reports he has been going to the wound clinic and it has been healing well until Thursday when it started draining and getting red.  He went to wound clinic Friday who started him on oral augmentin and arranged for an outpt mri to be done.  It got worse so he came to the ED.  Reports fever.  No chills.  No n/v/d.  Clinical Impression  Mr. Matlock has been followed at the wound center in [place] since mid January.  Friday he noted that his wound Located on his right ankle was red, hot, painful and oozing therefore he came to the ER and was admitted for antibiotics.  Pulse lavage was ordered and completed.  Mr. Kohlmeyer has a follow up visit at the wound center in [facility] on Thursday therefore he will not need further physical therapy intervention.     Follow Up Recommendations No PT follow up    Equipment Recommendations  None recommended by PT    Recommendations for Other Services       Precautions / Restrictions Precautions Precautions: None Restrictions Weight Bearing Restrictions: No      Mobility  Bed Mobility Overal bed mobility: Independent                Transfers Overall transfer level: Modified independent Equipment used: Rolling walker (2 wheeled)                Ambulation/Gait Ambulation/Gait assistance: Modified independent (Device/Increase time) Ambulation Distance (Feet): 10 Feet Assistive device: Rolling walker (2 wheeled) Gait Pattern/deviations: Step-to pattern                    Pertinent Vitals/Pain Pain Assessment: No/denies pain (no pain in bed; standing 5/10)    Home Living Family/patient  expects to be discharged to:: Private residence Living Arrangements: Spouse/significant other Available Help at Discharge: Family Type of Home: House Home Access: Stairs to enter   Secretary/administrator of Steps: 7 Home Layout: Two level Home Equipment: Environmental consultant - 2 wheels;Cane - single point (chair lift in the home )      Prior Function Level of Independence: Independent with assistive device(s)               Extremity/Trunk ssessment        Lower Extremity Assessment Lower Extremity Assessment: Overall WFL for tasks assessed       Communication   Communication: No difficulties  Cognition Arousal/Alertness: Awake/alert Behavior During Therapy: WFL for tasks assessed/performed Overall Cognitive Status: Within Functional Limits for tasks assessed                                        General Comments  Pt wound is 2.5 cm located on the medial aspect of his right ankle.  Red perimeter of 3.5 cm.  Wound  Has no granulation at this time and unknown depth.  Pt received pulse lavage, debrided wound of slough Followed by dressing with santyl, moist 4x4, 4x4 and kling.  Pt tolerated treatment well.  Assessment/Plan    PT Assessment Patent does not need any further PT services  PT Problem List Decreased mobility (Pain with weightbearing but decreasing )       PT Treatment Interventions  (Pulse lavage to wound )    PT Goals (Current goals can be found in the Care Plan section)       Frequency  (Pt being discharged today)   Barriers to discharge   none       End of Session     Patient left: in bed   PT Visit Diagnosis:  (diabetic ulcer)    Time: 0810-0900 PT Time Calculation (min) (ACUTE ONLY): 50 min   Charges:   PT Evaluation $PT Eval Low Complexity: 1 Procedure PT Treatments $ PT Supplies: 1 Supply   PT G Codes:   PT G-Codes **NOT FOR INPATIENT CLASS** Functional Assessment Tool Used: AM-PAC 6 Clicks Basic  Mobility Functional Limitation: Mobility: Walking and moving around Mobility: Walking and Moving Around Current Status ([id number]): At least 20 percent but less than 40 percent impaired, limited or restricted Mobility: Walking and Moving Around Goal Status [phone]): At least 20 percent but less than 40 percent impaired, limited or restricted Mobility: Walking and Moving Around Discharge Status [phone]): At least 20 percent but less than 40 percent impaired, limited or restricted     Virgina Organ, PT CLT [phone] 02/19/2017, 8:31 AM

## 2017-02-21 ENCOUNTER — Other Ambulatory Visit: Payer: Self-pay | Admitting: Internal Medicine

## 2017-02-21 DIAGNOSIS — E1042 Type 1 diabetes mellitus with diabetic polyneuropathy: Secondary | ICD-10-CM | POA: Diagnosis not present

## 2017-02-21 DIAGNOSIS — L97311 Non-pressure chronic ulcer of right ankle limited to breakdown of skin: Secondary | ICD-10-CM | POA: Diagnosis not present

## 2017-02-21 DIAGNOSIS — M14671 Charcot's joint, right ankle and foot: Secondary | ICD-10-CM | POA: Diagnosis not present

## 2017-02-21 DIAGNOSIS — M069 Rheumatoid arthritis, unspecified: Secondary | ICD-10-CM | POA: Diagnosis not present

## 2017-02-21 DIAGNOSIS — E10622 Type 1 diabetes mellitus with other skin ulcer: Secondary | ICD-10-CM | POA: Diagnosis not present

## 2017-02-21 DIAGNOSIS — E1051 Type 1 diabetes mellitus with diabetic peripheral angiopathy without gangrene: Secondary | ICD-10-CM | POA: Diagnosis not present

## 2017-02-21 DIAGNOSIS — G40909 Epilepsy, unspecified, not intractable, without status epilepticus: Secondary | ICD-10-CM | POA: Diagnosis not present

## 2017-02-26 ENCOUNTER — Other Ambulatory Visit: Payer: Self-pay | Admitting: *Deleted

## 2017-02-26 ENCOUNTER — Ambulatory Visit (INDEPENDENT_AMBULATORY_CARE_PROVIDER_SITE_OTHER): Payer: Medicare Other | Admitting: Vascular Surgery

## 2017-02-26 ENCOUNTER — Encounter: Payer: Self-pay | Admitting: Vascular Surgery

## 2017-02-26 VITALS — BP 113/57 | HR 76 | Temp 98.0°F | Resp 16 | Ht 67.5 in | Wt 200.0 lb

## 2017-02-26 DIAGNOSIS — L97319 Non-pressure chronic ulcer of right ankle with unspecified severity: Principal | ICD-10-CM

## 2017-02-26 DIAGNOSIS — I83013 Varicose veins of right lower extremity with ulcer of ankle: Secondary | ICD-10-CM

## 2017-02-26 NOTE — Progress Notes (Signed)
Vascular and Vein Specialist of [facility]  Patient name: Andrew Schultz MRN: [id number] DOB: [birth year] Sex: male  REASON FOR CONSULT: Evaluation severe venous stasis disease and ulceration right medial ankle  HPI: Andrew Schultz is a 71 y.o. male, who is seen today for evaluation of arterial and venous concerns regarding open ulceration. He is very pleasant gentleman who is here today with his wife. He recently moved to [place] to be near his daughter and grandchildren. He lived his life in [place] prior to this. He has a very long history of insulin-dependent diabetes and Charcot foot deformity bilaterally. He has had the toe ulcerations and requirement for amputation of his toes over the past. Prior workups have revealed no evidence of significant arterial pathology. He does have baseline renal insufficiency as well. She has had marked changes of venous hypertension most likely in his right leg with hemosiderin deposits circumferentially and recently developed ulceration over the medial aspect of his distal ankle on the right. He had 2 hospitalizations in [place] dating back to December for antibiotic treatment. He is now enrolled in the wound center and is having ongoing compression wrapping and maximal topical treatment for approximately 3-4 months. Despite this he continues to have difficulty with healing of this large wound that is below the level of the medial malleolus. He has no history of DVT.  Past Medical History:  Diagnosis Date  . Apnea, sleep   . CKD (chronic kidney disease)   . Diabetes mellitus without complication (HCC)   . GERD (gastroesophageal reflux disease)   . Hyperkalemia   . Neuropathy (HCC)   . Obstructive sleep apnea on CPAP   . RA (rheumatoid arthritis) (HCC)   . Renal disorder     History reviewed. No pertinent family history.  SOCIAL HISTORY: Social History   Social History  . Marital status: Married    Spouse name:  N/A  . Number of children: N/A  . Years of education: N/A   Occupational History  . Not on file.   Social History Main Topics  . Smoking status: Never Smoker  . Smokeless tobacco: Never Used  . Alcohol use No  . Drug use: No  . Sexual activity: Not on file   Other Topics Concern  . Not on file   Social History Narrative  . No narrative on file    No Known Allergies  Current Outpatient Prescriptions  Medication Sig Dispense Refill  . aspirin EC 81 MG tablet Take 81 mg by mouth daily.    . cephALEXin (KEFLEX) 500 MG capsule Take 500 mg by mouth 4 (four) times daily.    . Cholecalciferol (VITAMIN D) 2000 units CAPS Take 1 capsule by mouth daily.     . cycloSPORINE (RESTASIS) 0.05 % ophthalmic emulsion Place 1 drop into both eyes 2 (two) times daily.    . Flax OIL Take 2-3,000 mg by mouth daily.     . folic acid (FOLVITE) 1 MG tablet Take 1 mg by mouth every morning.     Marland Kitchen HUMALOG 100 UNIT/ML injection 1-80 Units by Pump Prime route continuous. approx 80 units/24hrs    . leflunomide (ARAVA) 10 MG tablet Take 10 mg by mouth daily.    . methotrexate (RHEUMATREX) 2.5 MG tablet Take 4 tablets by mouth every Sunday. (10mg  total)    . Multiple Vitamin (MULTIVITAMIN WITH MINERALS) TABS tablet Take 1 tablet by mouth every morning.    . Omega-3 Fatty Acids (FISH OIL CONCENTRATE) 300 MG CAPS Take  2 g by mouth daily.    Marland Kitchen omeprazole (PRILOSEC) 10 MG capsule Take 10 mg by mouth every morning.    Marland Kitchen doxycycline (VIBRA-TABS) 100 MG tablet Take 1 tablet (100 mg total) by mouth every 12 (twelve) hours. (Patient not taking: Reported on 02/26/2017) 20 tablet 0   No current facility-administered medications for this visit.     REVIEW OF SYSTEMS:  [X]  denotes positive finding, [ ]  denotes negative finding Cardiac  Comments:  Chest pain or chest pressure:    Shortness of breath upon exertion:    Short of breath when lying flat:    Irregular heart rhythm:        Vascular    Pain in calf, thigh,  or hip brought on by ambulation:    Pain in feet at night that wakes you up from your sleep:     Blood clot in your veins:    Leg swelling:  x       Pulmonary    Oxygen at home:    Productive cough:     Wheezing:         Neurologic    Sudden weakness in arms or legs:     Sudden numbness in arms or legs:     Sudden onset of difficulty speaking or slurred speech:    Temporary loss of vision in one eye:     Problems with dizziness:         Gastrointestinal    Blood in stool:     Vomited blood:         Genitourinary    Burning when urinating:     Blood in urine:        Psychiatric    Major depression:         Hematologic    Bleeding problems:    Problems with blood clotting too easily:        Skin    Rashes or ulcers:        Constitutional    Fever or chills: x     PHYSICAL EXAM: Vitals:   02/26/17 1116  BP: (!) 113/57  Pulse: 76  Resp: 16  Temp: 98 F (36.7 C)  SpO2: 95%  Weight: 200 lb (90.7 kg)  Height: 5' 7.5" (1.715 m)    GENERAL: The patient is a well-nourished male, in no acute distress. The vital signs are documented above. CARDIOVASCULAR: Does have some mild pitting edema in his right lower from edema. Easily palpable right dorsalis pedis pulse. PULMONARY: There is good air exchange  ABDOMEN: Soft and non-tender  MUSCULOSKELETAL: Extensive Charcot foot deformity NEUROLOGIC: No focal weakness or paresthesias are detected. SKIN: 3 cm open ulcer over his medial ankle just below the malleolus PSYCHIATRIC: The patient has a normal affect.  DATA:  Noninvasive studies revealed normal flow with biphasic signal at the level of his ankle on the right. Venous study reveals reflux in his great saphenous vein with dilatation as well.  MEDICAL ISSUES: I reimage this vein with SonoSite ultrasound. This is quite enlarged and does have flow directly into the large varicosities in his calf as well. Reassured him that he does not have any evidence of arterial  insufficiency and therefore does not have any risk for limb loss associated with this. I do feel that he would be improved with ablation of his great saphenous vein for reduction of his superficial component of his venous hypertension which should hopefully allow him to heal his ulcer and reduce the  risk for recurrence. I spent this is an outpatient procedure lasting approximately one hour. I explained this would be done under straight local anesthesia. He wished to proceed as soon as possible   Larina Earthly, MD [facility] Vascular and Vein Specialists of [facility] Tel [phone] Pager [phone]

## 2017-03-01 ENCOUNTER — Encounter (HOSPITAL_BASED_OUTPATIENT_CLINIC_OR_DEPARTMENT_OTHER): Payer: Medicare Other | Attending: Internal Medicine

## 2017-03-01 DIAGNOSIS — A4901 Methicillin susceptible Staphylococcus aureus infection, unspecified site: Secondary | ICD-10-CM | POA: Insufficient documentation

## 2017-03-01 DIAGNOSIS — E1040 Type 1 diabetes mellitus with diabetic neuropathy, unspecified: Secondary | ICD-10-CM | POA: Diagnosis not present

## 2017-03-01 DIAGNOSIS — Z794 Long term (current) use of insulin: Secondary | ICD-10-CM | POA: Insufficient documentation

## 2017-03-01 DIAGNOSIS — Z9641 Presence of insulin pump (external) (internal): Secondary | ICD-10-CM | POA: Diagnosis not present

## 2017-03-01 DIAGNOSIS — E1051 Type 1 diabetes mellitus with diabetic peripheral angiopathy without gangrene: Secondary | ICD-10-CM | POA: Insufficient documentation

## 2017-03-01 DIAGNOSIS — E10610 Type 1 diabetes mellitus with diabetic neuropathic arthropathy: Secondary | ICD-10-CM | POA: Diagnosis not present

## 2017-03-01 DIAGNOSIS — E1069 Type 1 diabetes mellitus with other specified complication: Secondary | ICD-10-CM | POA: Insufficient documentation

## 2017-03-01 DIAGNOSIS — M86671 Other chronic osteomyelitis, right ankle and foot: Secondary | ICD-10-CM | POA: Insufficient documentation

## 2017-03-01 DIAGNOSIS — L97316 Non-pressure chronic ulcer of right ankle with bone involvement without evidence of necrosis: Secondary | ICD-10-CM | POA: Insufficient documentation

## 2017-03-01 DIAGNOSIS — E10622 Type 1 diabetes mellitus with other skin ulcer: Secondary | ICD-10-CM | POA: Diagnosis not present

## 2017-03-04 ENCOUNTER — Telehealth (HOSPITAL_BASED_OUTPATIENT_CLINIC_OR_DEPARTMENT_OTHER): Payer: Self-pay | Admitting: *Deleted

## 2017-03-04 DIAGNOSIS — E10622 Type 1 diabetes mellitus with other skin ulcer: Secondary | ICD-10-CM | POA: Diagnosis not present

## 2017-03-06 ENCOUNTER — Telehealth: Payer: Self-pay | Admitting: Infectious Disease

## 2017-03-06 NOTE — Telephone Encounter (Signed)
Dr. 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 Patient is scheduled to see [place] in late April but Dr. Leanord Hawking would like him to be seen earlier.   He is trying to have the pateint seen by a foot specialist with [facility] and if not him then with Dr. Victorino Dike  This sounds like a case where patient will require BKA to cure this unfortunately  Does ANYONE have availability to see him sooner?  I dont have obvious  time/space to start overbooking until the 23rd.   I will only be in clinic next week Tuesday afternoon (with THP event that night) Wednesday all day (but with PrEP dinner talk  Wed night) Thursday and Friday out of town  If not best I could offer would be to try this Friday at some time not during clinic meeting

## 2017-03-07 ENCOUNTER — Other Ambulatory Visit: Payer: Medicare Other | Admitting: Vascular Surgery

## 2017-03-07 NOTE — Telephone Encounter (Signed)
I can see him Monday or Tue if you want to fit him into my schedule.

## 2017-03-07 NOTE — Telephone Encounter (Signed)
Thanks Rob!

## 2017-03-11 DIAGNOSIS — E10622 Type 1 diabetes mellitus with other skin ulcer: Secondary | ICD-10-CM | POA: Diagnosis not present

## 2017-03-12 ENCOUNTER — Encounter (HOSPITAL_COMMUNITY): Payer: Medicare Other

## 2017-03-12 ENCOUNTER — Ambulatory Visit: Payer: Medicare Other | Admitting: Vascular Surgery

## 2017-03-14 ENCOUNTER — Ambulatory Visit (INDEPENDENT_AMBULATORY_CARE_PROVIDER_SITE_OTHER): Payer: Medicare Other | Admitting: Internal Medicine

## 2017-03-14 ENCOUNTER — Encounter: Payer: Self-pay | Admitting: Internal Medicine

## 2017-03-14 DIAGNOSIS — E11628 Type 2 diabetes mellitus with other skin complications: Secondary | ICD-10-CM | POA: Diagnosis not present

## 2017-03-14 DIAGNOSIS — L089 Local infection of the skin and subcutaneous tissue, unspecified: Secondary | ICD-10-CM | POA: Diagnosis not present

## 2017-03-14 NOTE — Progress Notes (Signed)
[facility]  Reason for Consult: Diabetic foot infection Referring Physician: Dr. Kirtland Bouchard  Patient Active Problem List   Diagnosis Date Noted  . Diabetic foot infection (HCC) 03/14/2017    Priority: High  . Diabetic foot ulcer (HCC) 02/17/2017  . RA (rheumatoid arthritis) (HCC)   . Obstructive sleep apnea on CPAP   . Neuropathy     Patient's Medications  New Prescriptions   No medications on file  Previous Medications   ASPIRIN EC 81 MG TABLET    Take 81 mg by mouth daily.   CEPHALEXIN (KEFLEX) 500 MG CAPSULE    Take 500 mg by mouth 4 (four) times daily.   CHOLECALCIFEROL (VITAMIN D) 2000 UNITS CAPS    Take 1 capsule by mouth daily.    CYCLOSPORINE (RESTASIS) 0.05 % OPHTHALMIC EMULSION    Place 1 drop into both eyes 2 (two) times daily.   FLAX OIL    Take 2-3,000 mg by mouth daily.    FOLIC ACID (FOLVITE) 1 MG TABLET    Take 1 mg by mouth every morning.    HUMALOG 100 UNIT/ML INJECTION    1-80 Units by Pump Prime route continuous. approx 80 units/24hrs   LEFLUNOMIDE (ARAVA) 10 MG TABLET    Take 10 mg by mouth daily.   METHOTREXATE (RHEUMATREX) 2.5 MG TABLET    Take 4 tablets by mouth every Sunday. (10mg  total)   MULTIPLE VITAMIN (MULTIVITAMIN WITH MINERALS) TABS TABLET    Take 1 tablet by mouth every morning.   OMEGA-3 FATTY ACIDS (FISH OIL CONCENTRATE) 300 MG CAPS    Take 2 g by mouth daily.   OMEPRAZOLE (PRILOSEC) 10 MG CAPSULE    Take 10 mg by mouth every morning.   ONE TOUCH ULTRA TEST TEST STRIP    USE TO TEST BLOOD SUGAR TID  Modified Medications   No medications on file  Discontinued Medications   DOXYCYCLINE (VIBRA-TABS) 100 MG TABLET    Take 1 tablet (100 mg total) by mouth every 12 (twelve) hours.    Recommendations: 1. Continue cephalexin for now 2. I will review antibiotic management with him after he meets with Dr. 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 This would be an extremely difficult infection to cure with antibiotic therapy alone, even if we switch to IV antibiotics. He is aware that amputation may be a more optimal approach. There is no urgency to make the decision about IV antibiotics. He will meet with Dr. Bennett Scrape early next week then call me to discuss options for antibiotic management.   HPI: Andrew Schultz is a 71 y.o. 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 In January he developed a small ulcer over his right medial malleolus that has gradually increased. He has been followed by Dr. February at the wound center. He had an MRI of his right foot on 02/17/2017 which revealed severe Charcot arthropathy and bone destruction. The large medial ulceration was noted and there was a complex 43 mm fluid collection in the mid foot. He underwent bone biopsy on 02/21/2017 which showed inflamed and necrotic granulation tissue with embedded devitalized bone fragments and marrow with chronic osteomyelitis. Bone culture grew MSSA. He was started on cephalexin on 02/21/2017. His wife believes that his drainage has  actually increased since that time. He is scheduled to meet with an orthopedic surgeon, Dr. Bennett Scrape, next week. He has had PICC lines for IV antibiotics on 3 occasions in the past. He has chronic renal insufficiency and his nephrologist told him that he would need a central line if he needs IV antibiotic therapy.  Review of Systems: Review of Systems  Constitutional: Negative for chills, diaphoresis and fever.  Musculoskeletal: Positive for joint pain.      Past Medical History:  Diagnosis Date  . Apnea, sleep   . CKD (chronic kidney disease)   . Diabetes mellitus without complication (HCC)   . GERD (gastroesophageal reflux disease)   . Hyperkalemia   . Neuropathy   . Obstructive  sleep apnea on CPAP   . RA (rheumatoid arthritis) (HCC)   . Renal disorder     Social History  Substance Use Topics  . Smoking status: Never Smoker  . Smokeless tobacco: Never Used  . Alcohol use No    No family history on file. No Known Allergies  OBJECTIVE: Vitals:   03/14/17 1519  BP: 116/66  Pulse: 80  Temp: 98.1 F (36.7 C)  TempSrc: Oral  Weight: 200 lb (90.7 kg)  Height: 5' 7.5" (1.715 m)   Body mass index is 30.86 kg/m.   Physical Exam  Constitutional: He is oriented to person, place, and time.  He is very pleasant and talkative. He is in no distress. He is seated in a wheelchair.  Musculoskeletal:  He has extensive changes of Charcot arthropathy in both feet. He has an ulcer over his right medial malleolus that is about 2-1/2 cm wide and several centimeters deep. A large amount of bloody drainage is noted on his gauze dressings  Neurological: He is alert and oriented to person, place, and time.  Psychiatric: Mood and affect normal.    Microbiology: No results found for this or any previous visit (from the past 240 hour(s)).  Cliffton Asters, MD [facility] [phone] pager   [phone] cell 03/14/2017, 3:55 PM

## 2017-03-18 DIAGNOSIS — E10622 Type 1 diabetes mellitus with other skin ulcer: Secondary | ICD-10-CM | POA: Diagnosis not present

## 2017-03-22 NOTE — Telephone Encounter (Signed)
Mr. Ortner called today and stated that he will not need my services at this time.

## 2017-03-25 ENCOUNTER — Ambulatory Visit: Payer: Medicare Other | Admitting: Infectious Disease

## 2017-03-26 HISTORY — PX: LEG AMPUTATION BELOW KNEE: SHX694

## 2017-04-04 ENCOUNTER — Other Ambulatory Visit: Payer: Medicare Other | Admitting: Vascular Surgery

## 2017-04-08 ENCOUNTER — Telehealth: Payer: Self-pay | Admitting: Internal Medicine

## 2017-04-11 ENCOUNTER — Ambulatory Visit: Payer: Medicare Other | Admitting: Internal Medicine

## 2017-04-11 ENCOUNTER — Encounter (HOSPITAL_COMMUNITY): Payer: Medicare Other

## 2017-04-11 ENCOUNTER — Ambulatory Visit: Payer: Medicare Other | Admitting: Vascular Surgery

## 2017-04-30 NOTE — Telephone Encounter (Signed)
Andrew Schultz read

## 2017-05-01 ENCOUNTER — Encounter (HOSPITAL_COMMUNITY): Payer: Self-pay | Admitting: Occupational Therapy

## 2017-05-01 ENCOUNTER — Ambulatory Visit (HOSPITAL_COMMUNITY): Payer: Medicare Other | Attending: Physical Medicine and Rehabilitation | Admitting: Occupational Therapy

## 2017-05-01 DIAGNOSIS — R262 Difficulty in walking, not elsewhere classified: Secondary | ICD-10-CM | POA: Insufficient documentation

## 2017-05-01 DIAGNOSIS — R2681 Unsteadiness on feet: Secondary | ICD-10-CM | POA: Insufficient documentation

## 2017-05-01 DIAGNOSIS — R29898 Other symptoms and signs involving the musculoskeletal system: Secondary | ICD-10-CM | POA: Diagnosis not present

## 2017-05-01 NOTE — Patient Instructions (Signed)
Repeat all exercises 10-15 times, 1-2 times per day. Can use a light weight to work on strengthening   1) Shoulder Protraction    Begin with elbows by your side, slowly "punch" straight out in front of you keeping arms/elbows straight.      2) Shoulder Flexion  Supine:     Standing:         Begin with arms at your side with thumbs pointed up, slowly raise both arms up and forward towards overhead.               3) Horizontal abduction/adduction  Supine:   Standing:           Begin with arms straight out in front of you, bring out to the side in at "T" shape. Keep arms straight entire time.                 4) Internal & External Rotation    *No band* -Stand with elbows at the side and elbows bent 90 degrees. Move your forearms away from your body, then bring back inward toward the body.     5) Shoulder Abduction  Supine:     Standing:       Lying on your back begin with your arms flat on the table next to your side. Slowly move your arms out to the side so that they go overhead, in a jumping jack or snow angel movement.    6) X to V arms (cheerleader move):  Begin with arms straight down, crossed in front of body in an "X". Keeping arms crossed, lift arms straight up overhead. Then spread arms apart into a "V" shape.  Bring back together into x and lower down to starting position.      Strengthening: Chest Pull - Resisted   Hold Theraband in front of body with hands about shoulder width a part. Pull band a part and back together slowly. Repeat __10__ times. Complete _1___ set(s) per session.. Repeat __1-2__ session(s) per day.  http://orth.exer.us/926   Copyright  VHI. All rights reserved.   PNF Strengthening: Resisted   Standing with resistive band around each hand, bring right arm up and away, thumb back. Repeat __10__ times per set. Do __1__ sets per session. Do __1-2__ sessions per  day.                           Resisted External Rotation: in Neutral - Bilateral   Sit or stand, tubing in both hands, elbows at sides, bent to 90, forearms forward. Pinch shoulder blades together and rotate forearms out. Keep elbows at sides. Repeat _10___ times per set. Do __1__ sets per session. Do _1-2___ sessions per day.  http://orth.exer.us/966   Copyright  VHI. All rights reserved.   PNF Strengthening: Resisted   Standing, hold resistive band above head. Bring right arm down and out from side. Repeat __10__ times per set. Do __1__ sets per session. Do _1-2___ sessions per day.  http://orth.exer.us/922   Copyright  VHI. All rights reserved.

## 2017-05-01 NOTE — Therapy (Signed)
[facility] [address] [place], [place], [address] Phone: [phone]   Fax:  [phone]  Occupational Therapy Evaluation  Patient Details  Name: Andrew Schultz MRN: [id number] Date of Birth: 06/23/1946 Referring Provider: Dr. Naoma Diener  Encounter Date: 05/01/2017      OT End of Session - 05/01/17 1140    Visit Number 1   Number of Visits 1   Date for OT Re-Evaluation 05/02/17   Authorization Type U[employer] Medicare   Authorization Time Period Before 10th visit   Authorization - Visit Number 1   Authorization - Number of Visits 10   OT Start Time 1034   OT Stop Time 1108   OT Time Calculation (min) 34 min   Activity Tolerance Patient tolerated treatment well   Behavior During Therapy [facility] for tasks assessed/performed      Past Medical History:  Diagnosis Date  . Apnea, sleep   . CKD (chronic kidney disease)   . Diabetes mellitus without complication (HCC)   . GERD (gastroesophageal reflux disease)   . Hyperkalemia   . Neuropathy   . Obstructive sleep apnea on CPAP   . RA (rheumatoid arthritis) (HCC)   . Renal disorder     Past Surgical History:  Procedure Laterality Date  . ambutation toes    . CHOLECYSTECTOMY      There were no vitals filed for this visit.      Subjective Assessment - 05/01/17 1116    Subjective  S: I think I've been doing pretty good since I've been home.    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 Pt spent two weeks at [facility] immdiately post surgery. Pt was referred to occupational therapy by Dr. Naoma Diener.    Patient Stated Goals To be independent   Currently in Pain? No/denies           [facility] OT Assessment - 05/01/17 1033      Assessment   Diagnosis s/p BKA   Referring Provider Dr. Tawanna Cooler Reiter   Onset Date 04/05/17   Prior Therapy 10 days at [facility]     Precautions   Precautions Other (comment)   Precaution Comments s/p BKA      Restrictions   Weight Bearing Restrictions Yes     Balance Screen   Has the patient fallen in the past 6 months No   Has the patient had a decrease in activity level because of a fear of falling?  No   Is the patient reluctant to leave their home because of a fear of falling?  No     Prior Function   Level of Independence Independent with basic ADLs   Vocation Retired   Leisure reading, playing board game     ADL   Eating/Feeding Set up   Grooming Modified independent   Lower Body Bathing Modified independent   Upper Body Dressing Independent   Lower Body Dressing Modified independent   Firefighter Regular height toilet   Toileting - Conservator, museum/gallery -  Estate manager/land agent Expression   Dominant Hand Right     Cognition   Overall Cognitive Status Within Functional Limits for tasks assessed     ROM / Strength   AROM / PROM / Strength Strength     Strength  Overall Strength Comments Assessed in sitting, er/IR adducted   Strength Assessment Site Shoulder;Elbow   Right/Left Shoulder Right;Left   Right Shoulder Flexion 4+/5   Right Shoulder ABduction 4+/5   Right Shoulder Internal Rotation 4+/5   Right Shoulder External Rotation 4+/5   Left Shoulder Flexion 4+/5   Left Shoulder ABduction 4+/5   Left Shoulder Internal Rotation 4+/5   Left Shoulder External Rotation 4+/5   Right/Left Elbow Right;Left   Right Elbow Flexion 4+/5   Right Elbow Extension 4/5   Left Elbow Flexion 4+/5   Left Elbow Extension 4/5                         OT Education - 05/19/17 1113    Education provided Yes   Education Details BUE strengthening Exercises   Person(s) Educated Patient;Spouse   Methods Explanation;Demonstration;Handout   Comprehension Verbalized understanding;Returned demonstration                     Plan - 05/19/17 1141    Clinical Impression Statement A: 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 Pt presents with wife, using wheelchair for functional mobility. Pt demonstrates independence in wheelchair mobility and transfers, and reports modified independence in ADL completion at home-wife confirms. Pt demonstrates good BUE strength, provided with BUE strengthening exercises for HEP for improved ability to complete transfers, use walker, and propel wheelchair up ramp with greater independence. No further OT services are required at this time.    Occupational Profile and client history currently impacting functional performance Pt is very determined and motivated to complete B/IADLs as independently as possible.    Occupational performance deficits (Please refer to evaluation for details): ADL's;IADL's;Leisure   Rehab Potential Good   OT Frequency One time visit   OT Treatment/Interventions Self-care/ADL training;Therapeutic exercise;Patient/family education   Plan P: Pt provided with BUE strengthening exercises for HEP to improve BUE strength required for B/IADL completion, transfer tasks, and functional mobility using RW and wheelchair.    Consulted and Agree with Plan of Care Patient;Family member/caregiver   Family Member Consulted wife      Patient will benefit from skilled therapeutic intervention in order to improve the following deficits and impairments:  Decreased strength  Visit Diagnosis: Other symptoms and signs involving the musculoskeletal system      G-Codes - 05/19/2017 1206/03/30    Functional Assessment Tool Used (Outpatient only) Clinical judgement   Functional Limitation Self care   Self Care Goal Status (E6754) At least 1 percent but less than 20 percent impaired, limited or restricted   Self Care Discharge Status [phone]) At least 1 percent but less than 20 percent impaired, limited or restricted      Problem List Patient Active  Problem List   Diagnosis Date Noted  . Diabetic foot infection (HCC) 03/14/2017  . Diabetic foot ulcer (HCC) 02/17/2017  . RA (rheumatoid arthritis) (HCC)   . Obstructive sleep apnea on CPAP   . Neuropathy    Ezra Sites, OTR/L  [phone] 2017-05-19, 12:08 PM  [place] [facility] [address] [place], [place], [address] Phone: [phone]   Fax:  [phone]  Name: Andrew Schultz MRN: [id number] Date of Birth: Jun 14, 1946

## 2017-05-03 ENCOUNTER — Ambulatory Visit (HOSPITAL_COMMUNITY): Payer: Medicare Other | Admitting: Physical Therapy

## 2017-05-03 DIAGNOSIS — R2681 Unsteadiness on feet: Secondary | ICD-10-CM

## 2017-05-03 DIAGNOSIS — R262 Difficulty in walking, not elsewhere classified: Secondary | ICD-10-CM

## 2017-05-03 DIAGNOSIS — R29898 Other symptoms and signs involving the musculoskeletal system: Secondary | ICD-10-CM | POA: Diagnosis not present

## 2017-05-03 NOTE — Therapy (Signed)
[facility] [address] [place], [place], [address] Phone: [phone]   Fax:  [phone]  Physical Therapy Evaluation  Patient Details  Name: Andrew Schultz MRN: [id number] Date of Birth: November 04, 1946 Referring Provider: Naoma Schultz  Encounter Date: 05/03/2017      PT End of Session - 05/03/17 1334    Visit Number 1   Number of Visits 18   Date for PT Re-Evaluation 06/02/17   Authorization Type UHC Medicare   Authorization - Visit Number 1   Authorization - Number of Visits 18   PT Start Time 1300   PT Stop Time 1333   PT Time Calculation (min) 33 min   Equipment Utilized During Treatment Gait belt   Activity Tolerance Patient tolerated treatment well   Behavior During Therapy WFL for tasks assessed/performed      Past Medical History:  Diagnosis Date  . Apnea, sleep   . CKD (chronic kidney disease)   . Diabetes mellitus without complication (HCC)   . GERD (gastroesophageal reflux disease)   . Hyperkalemia   . Neuropathy   . Obstructive sleep apnea on CPAP   . RA (rheumatoid arthritis) (HCC)   . Renal disorder     Past Surgical History:  Procedure Laterality Date  . ambutation toes    . CHOLECYSTECTOMY      There were no vitals filed for this visit.       Subjective Assessment - 05/03/17 1254    Subjective Andrew Schultz states that he had a Rt BKA in 04/05/2017.  He gets his stiches out on Thursday.  After the operation he went to rehabilitation for two weeks and has been at home for the past two weeks.  He was given a HEP but wants to be as prepared as possible when he recieves his prothesis which will be in about 4 week.s    Pertinent History [name] foot, DM    How long can you sit comfortably? No problem    How long can you stand comfortably? with walker short period of time    How long can you walk comfortably? Walks for short distances aroung the house with a rolling walker; uses wheel chair for any substantial  ambulation     Currently in Pain? No/denies            [facility] PT Assessment - 05/03/17 0001      Assessment   Medical Diagnosis Rt BKA   Referring Provider Andrew Schultz   Onset Date/Surgical Date 04/05/17   Next MD Visit 05/09/2017   Prior Therapy in patient rehab.      Precautions   Precautions None     Restrictions   Weight Bearing Restrictions No     Balance Screen   Has the patient fallen in the past 6 months No   Has the patient had a decrease in activity level because of a fear of falling?  Yes   Is the patient reluctant to leave their home because of a fear of falling?  No     Home Environment   Living Environment Private residence  has a chair lift to the second story.     Prior Function   Level of Independence Independent with basic ADLs   Vocation Retired   Leisure reading, Holiday representative Tests   Functional tests Single leg stance     Single Leg Stance   Comments Lt: 2 seconds  ROM / Strength   AROM / PROM / Strength Strength     Strength   Strength Assessment Site Hip;Knee   Right/Left Hip Right   Right Hip Flexion 5/5   Right Hip Extension 5/5   Right Hip ABduction 5/5   Right/Left Knee Right   Right Knee Flexion 5/5   Right Knee Extension 5/5     Ambulation/Gait   Ambulation/Gait Yes   Ambulation/Gait Assistance 6: Modified independent (Device/Increase time)   Ambulation Distance (Feet) 80 Feet   Assistive device Rolling walker   Gait Comments 2 minutes then pt was fatigued.             Objective measurements completed on examination: See above findings.                  PT Education - 05/03/17 1333    Education provided Yes   Education Details Begin walking in his home for endurance with his rolling walker   Person(s) Educated Patient   Methods Explanation   Comprehension Verbalized understanding          PT Short Term Goals - 05/03/17 1341      PT SHORT TERM GOAL #1   Title Pt to be  able to don and doff prothesis I  Pt will not come to therapy for four weeks.  Goals are for 2 weeks after pt returns to therapy.    Time 6   Period Weeks   Status New     PT SHORT TERM GOAL #2   Title PT to be able to single leg stance on both legs for at least 10 seconds to decrease risk of falling    Time 6   Period Weeks   Status New     PT SHORT TERM GOAL #3   Title Pt to be able to walk with least restrictive device for 225 feet in 3 minute time period.    Time 6   Period Weeks           PT Long Term Goals - 05/03/17 1344      PT LONG TERM GOAL #1   Title Pt single leg balance on both LE to be at least 20seconds to allow pt to feel confident walking with a cane inside.   Time 8  PT will not come back for 4 weeks goal is for 4 weeks after he resumes therapy   Period Weeks   Status New     PT LONG TERM GOAL #2   Title Pt to be able to ambulate for 15 minutes with least assistive device to be able to complete short shopping trips.    Time 8   Period Weeks   Status New     PT LONG TERM GOAL #3   Title Pt to verbalize the importance of inspecting his Rt leg on a daily basis after removing his prothesis for any redness.   Time 8   Period Weeks   Status New                Plan - 05/03/17 1335    Clinical Impression Statement Andrew Schultz is a 71 yo male who had a recent Rt BKA on 04/05/2017.  He gets his stitches out next week and then will be referred for a prothesis.  He is currently being referred to skilled therapy for pre-prothesis rehab.  Evaluation reveals decreased activtity tolerance and balance both of which the therapist and pt feel can be worked  on at home.  Andrew Schultz will benefit from skilled physical therapy in four week to begin prosthetic,  gait and balance training once his prothesis is obtained. .     Clinical Presentation Stable   Clinical Decision Making Low   Rehab Potential Good   PT Frequency 3x / week   PT Duration 8 weeks   PT  Treatment/Interventions ADLs/Self Care Home Management;Patient/family education;Balance training;Prosthetic Training   PT Next Visit Plan PT advised to complete HEP.  He will return once he has obtained his prothesis for education in donning and doffing prothesis, gait training with least restrictive device and balance activites to decrease risk of falling    PT Home Exercise Plan Hip flexor stretch B;  Begin timing ambulation and increasing to improve activity tolerance.    Consulted and Agree with Plan of Care Patient      Patient will benefit from skilled therapeutic intervention in order to improve the following deficits and impairments:  Cardiopulmonary status limiting activity, Decreased activity tolerance, Decreased balance, Difficulty walking  Visit Diagnosis: Difficulty in walking, not elsewhere classified - Plan: PT plan of care cert/re-cert  Unsteadiness on feet - Plan: PT plan of care cert/re-cert      G-Codes - 05/06/2017 1347    Functional Assessment Tool Used (Outpatient Only) Clinical judgement:  Pt fatigued after walking for 2 minutes    Functional Limitation Mobility: Walking and moving around   Mobility: Walking and Moving Around Current Status ([id number]) At least 60 percent but less than 80 percent impaired, limited or restricted   Mobility: Walking and Moving Around Goal Status [phone]) At least 20 percent but less than 40 percent impaired, limited or restricted       Problem List Patient Active Problem List   Diagnosis Date Noted  . Diabetic foot infection (HCC) 03/14/2017  . Diabetic foot ulcer (HCC) 02/17/2017  . RA (rheumatoid arthritis) (HCC)   . Obstructive sleep apnea on CPAP   . Neuropathy    Virgina Organ, PT CLT [phone] 05/06/2017, 1:51 PM  [place] [facility] [address] [place], [place], [address] Phone: [phone]   Fax:  [phone]  Name: Cove Haydon MRN: [id number] Date of Birth: 08/30/1946

## 2017-05-06 ENCOUNTER — Ambulatory Visit (HOSPITAL_COMMUNITY): Payer: Medicare Other | Admitting: Physical Therapy

## 2017-05-08 ENCOUNTER — Encounter (HOSPITAL_COMMUNITY): Payer: Medicare Other

## 2017-05-13 ENCOUNTER — Encounter (HOSPITAL_COMMUNITY): Payer: Medicare Other | Admitting: Physical Therapy

## 2017-05-15 ENCOUNTER — Encounter (HOSPITAL_COMMUNITY): Payer: Medicare Other | Admitting: Physical Therapy

## 2017-05-20 ENCOUNTER — Encounter (HOSPITAL_COMMUNITY): Payer: Medicare Other | Admitting: Physical Therapy

## 2017-05-22 ENCOUNTER — Encounter (HOSPITAL_COMMUNITY): Payer: Medicare Other | Admitting: Physical Therapy

## 2017-05-27 ENCOUNTER — Encounter (HOSPITAL_COMMUNITY): Payer: Medicare Other | Admitting: Physical Therapy

## 2017-11-26 ENCOUNTER — Encounter (HOSPITAL_COMMUNITY): Payer: Self-pay | Admitting: *Deleted

## 2017-11-26 ENCOUNTER — Emergency Department (HOSPITAL_COMMUNITY)
Admission: EM | Admit: 2017-11-26 | Discharge: 2017-11-26 | Disposition: A | Discharge status: Home / Self Care | Payer: Medicare Other | Attending: Emergency Medicine | Admitting: Emergency Medicine

## 2017-11-26 ENCOUNTER — Emergency Department (HOSPITAL_COMMUNITY): Payer: Medicare Other

## 2017-11-26 ENCOUNTER — Other Ambulatory Visit: Payer: Self-pay

## 2017-11-26 DIAGNOSIS — Z794 Long term (current) use of insulin: Secondary | ICD-10-CM | POA: Diagnosis not present

## 2017-11-26 DIAGNOSIS — Z79899 Other long term (current) drug therapy: Secondary | ICD-10-CM | POA: Insufficient documentation

## 2017-11-26 DIAGNOSIS — N189 Chronic kidney disease, unspecified: Secondary | ICD-10-CM | POA: Insufficient documentation

## 2017-11-26 DIAGNOSIS — Z7982 Long term (current) use of aspirin: Secondary | ICD-10-CM | POA: Insufficient documentation

## 2017-11-26 DIAGNOSIS — Z89511 Acquired absence of right leg below knee: Secondary | ICD-10-CM | POA: Diagnosis not present

## 2017-11-26 DIAGNOSIS — M25551 Pain in right hip: Secondary | ICD-10-CM

## 2017-11-26 DIAGNOSIS — E1122 Type 2 diabetes mellitus with diabetic chronic kidney disease: Secondary | ICD-10-CM | POA: Insufficient documentation

## 2017-11-26 MED ORDER — PREDNISONE 10 MG PO TABS: 10.0000 mg | ORAL_TABLET | Freq: Every day | ORAL | 0 refills | Status: DC

## 2017-11-26 MED ORDER — HYDROMORPHONE HCL 4 MG PO TABS: 4.0000 mg | ORAL_TABLET | Freq: Four times a day (QID) | ORAL | 0 refills | Status: DC | PRN

## 2017-11-26 MED ORDER — HYDROMORPHONE HCL 1 MG/ML IJ SOLN
1.0000 mg | Freq: Once | INTRAMUSCULAR | Status: AC
  Administered 2017-11-26: 1 mg via INTRAMUSCULAR
  Filled 2017-11-26: qty 1

## 2017-11-26 NOTE — ED Notes (Signed)
Patient denies pain and is resting comfortably.  

## 2017-11-26 NOTE — ED Triage Notes (Addendum)
Right hip pain  

## 2017-11-26 NOTE — Discharge Instructions (Signed)
CT scan showed arthritis but no fracture.  Recommend follow-up with orthopedics.  Phone number given.  Prescription for pain medicine and prednisone.

## 2017-11-26 NOTE — ED Notes (Signed)
Patient transported to CT 

## 2017-11-28 ENCOUNTER — Telehealth: Payer: Self-pay | Admitting: Orthopaedic Surgery

## 2017-11-28 NOTE — ED Provider Notes (Signed)
[facility] EMERGENCY DEPARTMENT Provider Note   CSN: [id number] Arrival date & time: 11/26/17  1437     History   Chief Complaint Chief Complaint  Patient presents with  . Hip Pain    HPI Andrew Schultz is a 72 y.o. male.  Atraumatic right hip pain for several days.  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  He has had a new prosthesis applied recently.  He thinks his generalized musculoskeletal equilibrium may be off.  No fever, sweats, chills.  Pain is worse with range of motion and palpation.  Severity is moderate.      Past Medical History:  Diagnosis Date  . Apnea, sleep   . CKD (chronic kidney disease)   . Diabetes mellitus without complication (HCC)   . GERD (gastroesophageal reflux disease)   . Hyperkalemia   . Neuropathy   . Obstructive sleep apnea on CPAP   . RA (rheumatoid arthritis) (HCC)   . Renal disorder     Patient Active Problem List   Diagnosis Date Noted  . Diabetic foot infection (HCC) 03/14/2017  . Diabetic foot ulcer (HCC) 02/17/2017  . RA (rheumatoid arthritis) (HCC)   . Obstructive sleep apnea on CPAP   . Neuropathy     Past Surgical History:  Procedure Laterality Date  . ambutation toes    . CHOLECYSTECTOMY         Home Medications    Prior to Admission medications   Medication Sig Start Date End Date Taking? Authorizing Provider  aspirin EC 81 MG tablet Take 81 mg by mouth daily.    [provider]  cephALEXin (KEFLEX) 500 MG capsule Take 500 mg by mouth 4 (four) times daily.    [provider]  Cholecalciferol (VITAMIN D) 2000 units CAPS Take 1 capsule by mouth daily.     [provider]  cycloSPORINE (RESTASIS) 0.05 % ophthalmic emulsion Place 1 drop into both eyes 2 (two) times daily. 11/13/13   [provider]  Flax OIL Take 2-3,000 mg by mouth daily.     [provider]  folic acid (FOLVITE) 1 MG tablet Take 1 mg by mouth  every morning.  05/04/15   [provider]  HUMALOG 100 UNIT/ML injection 1-80 Units by Pump Prime route continuous. approx 80 units/24hrs 07/11/15   [provider]  HYDROmorphone (DILAUDID) 4 MG tablet Take 1 tablet (4 mg total) by mouth every 6 (six) hours as needed for severe pain. 11/26/17   Donnetta Hutching, MD  Lactobacillus (PROBIOTIC ACIDOPHILUS) CAPS Take 1 capsule by mouth daily.    [provider]  leflunomide (ARAVA) 10 MG tablet Take 10 mg by mouth daily.    [provider]  methotrexate (RHEUMATREX) 2.5 MG tablet Take 4 tablets by mouth every Sunday. (10mg  total) 07/06/15   [provider]  Multiple Vitamin (MULTIVITAMIN WITH MINERALS) TABS tablet Take 1 tablet by mouth every morning.    [provider]  Omega-3 Fatty Acids (FISH OIL CONCENTRATE) 300 MG CAPS Take 2 g by mouth daily.    [provider]  omeprazole (PRILOSEC) 10 MG capsule Take 10 mg by mouth every morning.    [provider]  ONE TOUCH ULTRA TEST test strip USE TO TEST BLOOD SUGAR TID 03/11/17   [provider]  predniSONE (DELTASONE) 10 MG tablet Take 1 tablet (10 mg total) by mouth daily with breakfast. 2 tablets for 4 days; 1 tablet  for 4 days 11/26/17   Donnetta Hutching, MD    Family History No family history on file.  Social History Social History   Tobacco Use  . Smoking status: Never Smoker  . Smokeless tobacco: Never Used  Substance Use Topics  . Alcohol use: No  . Drug use: No     Allergies   Patient has no known allergies.   Review of Systems Review of Systems  All other systems reviewed and are negative.    Physical Exam Updated Vital Signs BP (!) 110/58 (BP Location: Right Arm)   Pulse 70   Temp 98.1 F (36.7 C) (Oral)   Resp 14   Ht 5' 7.5" (1.715 m)   Wt 92.5 kg (204 lb)   SpO2 97%   BMI 31.48 kg/m   Physical Exam  Constitutional: He is oriented to person, place, and time. He appears well-developed and  well-nourished.  HENT:  Head: Normocephalic and atraumatic.  Eyes: Conjunctivae are normal.  Neck: Neck supple.  Cardiovascular: Normal rate and regular rhythm.  Pulmonary/Chest: Effort normal and breath sounds normal.  Abdominal: Soft. Bowel sounds are normal.  Musculoskeletal:  Right lower extremity: BKA.  Tender on the superior lateral aspect of the hip.  Pain with range of motion.  Neurological: He is alert and oriented to person, place, and time.  Skin: Skin is warm and dry.  Psychiatric: He has a normal mood and affect. His behavior is normal.  Nursing note and vitals reviewed.    ED Treatments / Results  Labs (all labs ordered are listed, but only abnormal results are displayed) Labs Reviewed - No data to display  EKG  EKG Interpretation None       Radiology Ct Hip Right Wo Contrast  Result Date: 11/26/2017 CLINICAL DATA:  Right hip pain x6 weeks without known injury. EXAM: CT OF THE RIGHT HIP WITHOUT CONTRAST TECHNIQUE: Multidetector CT imaging of the right hip was performed according to the standard protocol. Multiplanar CT image reconstructions were also generated. COMPARISON:  Same day radiographs. FINDINGS: Bones/Joint/Cartilage No acute fracture of the right hip. Slight joint space narrowing the right hip with degenerative subchondral cystic change of the acetabular roof. Bone islands are noted of the right femoral head and acetabulum. Ligaments Suboptimally assessed by CT. Muscles and Tendons Negative Soft tissues No abnormal fluid collections. Calcified vas deferens that can be seen in diabetics. IMPRESSION: 1. No acute fracture nor joint dislocations.  No joint effusion. 2. Mild degenerative joint space narrowing of the right hip with subchondral cystic change of the acetabulum. Electronically Signed   By: Tollie Eth M.D.   On: 11/26/2017 20:03   Dg Hip Unilat W Or Wo Pelvis 2-3 Views Right  Result Date: 11/26/2017 CLINICAL DATA:  Right hip pain EXAM: DG HIP (WITH  OR WITHOUT PELVIS) 2-3V RIGHT COMPARISON:  None. FINDINGS: There is no evidence of hip fracture or dislocation. There is no evidence of arthropathy or other focal bone abnormality. IMPRESSION: Negative. Electronically Signed   By: Kennith Center M.D.   On: 11/26/2017 15:46    Procedures Procedures (including critical care time)  Medications Ordered in ED Medications  HYDROmorphone (DILAUDID) injection 1 mg (1 mg Intramuscular Given 11/26/17 1907)     Initial Impression / Assessment and Plan / ED Course  I have reviewed the triage vital signs and the nursing notes.  Pertinent labs & imaging results that were available during my care of the patient were reviewed by me and considered in my  medical decision making (see chart for details).     Patient complains of right lateral hip pain.  Plain films and CT reveal no fractures or dislocations.  Patient was given Dilaudid 1 mg IM in the ED which seemed to help.  Could be trochanteric bursitis; however I am concerned that he has a disequilibrium in his joint secondary to the new prosthesis.  Discharge medications oral Dilaudid and prednisone.  Will follow up with orthopedics  Final Clinical Impressions(s) / ED Diagnoses   Final diagnoses:  Right hip pain    ED Discharge Orders        Ordered    HYDROmorphone (DILAUDID) 4 MG tablet  Every 6 hours PRN     11/26/17 2040    predniSONE (DELTASONE) 10 MG tablet  Daily with breakfast     11/26/17 2040       Donnetta Hutching, MD 11/28/17 1058

## 2017-11-28 NOTE — Telephone Encounter (Signed)
Called back to patient in response to voice message, regarding patient's visit at [facility] Emergency room; reached voice mail, left message to return call.

## 2017-11-28 NOTE — Telephone Encounter (Signed)
Patient stopped by office this afternoon; appointment scheduled.

## 2017-12-04 ENCOUNTER — Ambulatory Visit (INDEPENDENT_AMBULATORY_CARE_PROVIDER_SITE_OTHER): Payer: Medicare Other | Admitting: Orthopedic Surgery

## 2017-12-04 ENCOUNTER — Ambulatory Visit (INDEPENDENT_AMBULATORY_CARE_PROVIDER_SITE_OTHER): Payer: Medicare Other

## 2017-12-04 ENCOUNTER — Encounter: Payer: Self-pay | Admitting: Orthopedic Surgery

## 2017-12-04 VITALS — BP 125/82 | HR 89 | Ht 67.0 in | Wt 200.0 lb

## 2017-12-04 DIAGNOSIS — M25551 Pain in right hip: Secondary | ICD-10-CM

## 2017-12-04 DIAGNOSIS — Z978 Presence of other specified devices: Secondary | ICD-10-CM

## 2017-12-04 DIAGNOSIS — M545 Low back pain: Secondary | ICD-10-CM | POA: Diagnosis not present

## 2017-12-04 DIAGNOSIS — S88111A Complete traumatic amputation at level between knee and ankle, right lower leg, initial encounter: Secondary | ICD-10-CM

## 2017-12-04 DIAGNOSIS — G8929 Other chronic pain: Secondary | ICD-10-CM

## 2017-12-04 NOTE — Progress Notes (Signed)
NEW PATIENT OFFICE VISIT    Chief Complaint  Patient presents with  . Hip Pain    right x 3 weeks     72 year old male had a below-knee amputation last year he then had a prosthesis placed in a month or so later did well with it and had no trouble until around Thanksgiving this year when he got a new prosthesis comes in for evaluation of hip pain with a normal hip x-ray    Patient localizes pain to his right buttock and right iliac crest and somewhat over the posterior trochanter does not radiate below the knee.  It is a constant dull aching pain worse when he lies on his side and is not associated with any radicular-like symptoms.  He does have a history of rheumatoid arthritis managed with methotrexate he is diabetic and he also had a history of Charcot foot    Review of Systems  Constitutional: Negative for chills, fever and weight loss.  Respiratory: Negative for shortness of breath.   Cardiovascular: Negative for chest pain.  Musculoskeletal: Negative for back pain.     Past Medical History:  Diagnosis Date  . Apnea, sleep   . CKD (chronic kidney disease)   . Diabetes mellitus without complication (HCC)   . GERD (gastroesophageal reflux disease)   . Hyperkalemia   . Neuropathy   . Obstructive sleep apnea on CPAP   . RA (rheumatoid arthritis) (HCC)   . Renal disorder     Past Surgical History:  Procedure Laterality Date  . ambutation toes    . CHOLECYSTECTOMY      History reviewed. No pertinent family history. Social History   Tobacco Use  . Smoking status: Never Smoker  . Smokeless tobacco: Never Used  Substance Use Topics  . Alcohol use: No  . Drug use: No    Current Meds  Medication Sig  . aspirin EC 81 MG tablet Take 81 mg by mouth daily.  . Cholecalciferol (VITAMIN D) 2000 units CAPS Take 1 capsule by mouth daily.   . cycloSPORINE (RESTASIS) 0.05 % ophthalmic emulsion Place 1 drop into both eyes 2 (two) times daily.  . Flax OIL Take 2-3,000 mg by  mouth daily.   . folic acid (FOLVITE) 1 MG tablet Take 1 mg by mouth every morning.   Marland Kitchen HUMALOG 100 UNIT/ML injection 1-80 Units by Pump Prime route continuous. approx 80 units/24hrs  . HYDROmorphone (DILAUDID) 4 MG tablet Take 1 tablet (4 mg total) by mouth every 6 (six) hours as needed for severe pain.  . Lactobacillus (PROBIOTIC ACIDOPHILUS) CAPS Take 1 capsule by mouth daily.  Marland Kitchen leflunomide (ARAVA) 10 MG tablet Take 10 mg by mouth daily.  . methotrexate (RHEUMATREX) 2.5 MG tablet Take 4 tablets by mouth every Sunday. (10mg  total)  . Multiple Vitamin (MULTIVITAMIN WITH MINERALS) TABS tablet Take 1 tablet by mouth every morning.  . Omega-3 Fatty Acids (FISH OIL CONCENTRATE) 300 MG CAPS Take 2 g by mouth daily.  omeprazole (PRILOSEC) 10 MG capsule Take 10 mg by mouth every morning.  . ONE TOUCH ULTRA TEST test strip USE TO TEST BLOOD SUGAR TID  . predniSONE (DELTASONE) 10 MG tablet Take 1 tablet (10 mg total) by mouth daily with breakfast. 2 tablets for 4 days; 1 tablet for 4 days   No Known Allergies   BP 125/82   Pulse 89   Ht 5\' 7"  (1.702 m)   Wt 200 lb (90.7 kg)   BMI 31.32 kg/m  Physical Exam  Constitutional: He is oriented to person, place, and time. He appears well-developed and well-nourished.  Right below the knee prosthesis  Musculoskeletal:  Gait station abnormal.  He seems to have a crouched gait flexed pelvo-lumbar junction and uses a walker  Neurological: He is alert and oriented to person, place, and time.  Psychiatric: He has a normal mood and affect. His behavior is normal. Judgment and thought content normal.    Ortho Exam   Lumbar spine nontender right buttock tender just above the ischial tuberosity Seems to have decreased spinal extension  Hip exam range of motion normal stability normal hip flexion abduction strength normal skin normal no tenderness to palpation over the hip joint or anterior thigh.  Sensation in the right leg is normal color is normal  to the right thigh.  Coordination balancing good with his prosthesis  X-rays will be obtained of his back// see dictated eort : spondylosis Ls pine   X-rays of the hip were normal  CT scan showed mild arthritis  Reports are in the chart I reviewed x-rays and report  My interpretation of the x-rays of the hip and pelvis AP pelvis AP lateral right hip normal hip joint normal hip measurements and normal femoral shaft angle  CT scan mild cysts in the acetabulum consistent with mild arthrosis.  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

## 2018-08-29 ENCOUNTER — Other Ambulatory Visit: Payer: Self-pay | Admitting: Podiatry

## 2018-09-03 NOTE — Patient Instructions (Signed)
Andrew Schultz  09/03/2018     @PREFPERIOPPHARMACY @   Your procedure is scheduled on  09/10/2018   Report to [facility] at  615   A.M.  Call this number if you have problems the morning of surgery:  [phone]   Remember:  Do not eat or drink after midnight.  You may drink clear liquids until  12 midnight 09/09/2018 .  Clear liquids allowed are:                    Water, Juice (non-citric and without pulp), Carbonated beverages, Clear Tea, Black Coffee only, Plain Jell-O only, Gatorade and Plain Popsicles only    Take these medicines the morning of surgery with A SIP OF WATER [place]. Dial your insulin pump down to your basal rate at midnight 09/09/2018.    Do not wear jewelry, make-up or nail polish.  Do not wear lotions, powders, or perfumes, or deodorant.  Do not shave 48 hours prior to surgery.  Men may shave face and neck.  Do not bring valuables to the hospital.  [facility] is not responsible for any belongings or valuables.  Contacts, dentures or bridgework may not be worn into surgery.  Leave your suitcase in the car.  After surgery it may be brought to your room.  For patients admitted to the hospital, discharge time will be determined by your treatment team.  Patients discharged the day of surgery will not be allowed to drive home.   Name and phone number of your driver:   family Special instructions:  None  Please read over the following fact sheets that you were given. Anesthesia Post-op Instructions and Care and Recovery After Surgery      Toe Amputation Toe amputation is a surgical procedure to remove all or part of a toe. You may have this procedure if:  Tissue in your toe is dying because of poor blood supply.  You have a severe infection in your toe.  Removing your toe keeps nearby tissue healthy. If the toe is infected, removing it helps to keep the infection from spreading. Tell a health care provider about:  Any allergies you  have.  All medicines you are taking, including vitamins, herbs, eye drops, creams, and over-the-counter medicines.  Any problems you or family members have had with anesthetic medicines.  Any blood disorders you have.  Any surgeries you have had.  Any medical conditions you have.  Whether you are pregnant or may be pregnant. What are the risks? Generally, this is a safe procedure. However, problems may occur, including:  Bleeding.  Buildup of blood and fluid (hematoma).  Infection.  Allergic reactions to medicines.  Tissue death in the flap of skin (flap necrosis).  Trouble with healing.  Minor changes in the way that you walk (your gait).  Feeling pain in the area that was removed (phantom pain). This is rare.  What happens before the procedure?  Follow instructions from your health care provider about eating or drinking restrictions.  Ask your health care provider about: ? Changing or stopping your regular medicines. This is especially important if you are taking diabetes medicines or blood thinners. ? Taking medicines such as aspirin and ibuprofen. These medicines can thin your blood. Do not take these medicines before your procedure if your health care provider instructs you not to.  You may be given antibiotic medicine to help prevent infection.  Plan to have  someone take you home after the procedure.  If you will be going home right after the procedure, plan to have someone with you for 24 hours. What happens during the procedure?  You will be given one or more of the following: ? A medicine to help you relax (sedative). ? A medicine to numb the area (local anesthetic). ? A medicine to make you fall asleep (general anesthetic). ? A medicine that is injected into your spine to numb the area below and slightly above the injection site (spinal anesthetic). ? A medicine that is injected into an area of your body to numb everything below the injection site  (regional anesthetic).  To reduce your risk of infection: ? Your health care team will wash or sanitize their hands. ? Your skin will be washed with soap.  Your surgeon will mark the area of your toe for removal.  Your surgeon will make a surgical cut (incision) in your toe.  The dead tissue and bone will be removed.  Nerves and vessels will be tied or heated with a special tool to stop bleeding.  The area will be drained and cleaned.  If only part of a toe is removed, the remaining part will be covered with a flap of skin.  The incision will be treated in one of these ways: ? It will be closed with stitches (sutures). ? It will be left open to heal if there is an infection.  The incision area may be packed with gauze and covered with bandages (dressings).  Tissue samples may be sent to a lab to be examined under a microscope. The procedure may vary among health care providers and hospitals. What happens after the procedure?  Your blood pressure, heart rate, breathing rate, and blood oxygen level will be monitored often until the medicines you were given have worn off.  Your foot will be raised up high (elevated) to relieve swelling.  You will be monitored for pain.  You will be given pain medicines and antibiotics.  Your health care provider or physical therapist will help you to move around as soon as possible.  Do not drive for 24 hours if you received a sedative. This information is not intended to replace advice given to you by your health care provider. Make sure you discuss any questions you have with your health care provider. Document Released: 10/24/2015 Document Revised: 07/16/2016 Document Reviewed: 08/06/2015 Elsevier Interactive Patient Education  2018 [employer]  Toe Amputation, Care After Refer to this sheet in the next few weeks. These instructions provide you with information on caring for yourself after your procedure. Your health care provider may  also give you more specific instructions. Your treatment has been planned according to current medical practices, but problems sometimes occur. Call your health care provider if you have any problems or questions after your procedure. What can I expect after the procedure? After your procedure, it is common to have some pain. Pain usually improves within a week. Follow these instructions at home: Medicines  Take your antibiotic medicine as told by your health care provider. Do not stop taking the antibiotic even if you start to feel better.  Take over-the-counter and prescription medicines only as told by your health care provider. Managing pain, stiffness, and swelling   If directed, apply ice to the surgical area: ? Put ice in a plastic bag. ? Place a towel between your skin and the bag. ? Leave the ice on for 20 minutes, 2-3 times  a day.  Raise (elevate) your foot so it is above the level of your heart. This helps to reduce swelling.  Try to walk each day. Incision care   Follow instructions from your health care provider about how to take care of your cut from surgery (incision). Make sure you: ? Wash your hands with soap and water before you change your bandage (dressing). If soap and water are not available, use hand sanitizer. ? Change your dressing as told by your health care provider. ? If you got stitches (sutures), leave them in place. They may need to stay in place for 2 weeks or longer.  Check your incision area every day for signs of infection. Check for: ? More redness, swelling, or pain. ? More fluid or blood. ? Warmth. ? Pus or a bad smell. Bathing  Do not take baths, swim, use a hot tub, or soak your foot until your health care provider approves.  You may shower unless you were told not to. When you shower, keep your dressing dry.  If your dressing has been removed, you may wash your skin with warm water and soap. Driving  Do not drive for 24 hours if you  received a sedative.  Do not drive or operate heavy machinery while taking prescription pain medicine. Activity  Do exercises as told by your health care provider.  Return to your normal activities as told by your health care provider. Ask your health care provider what activities are safe for you. General instructions  Do not use any tobacco products, such as cigarettes, chewing tobacco, and e-cigarettes. If you need help quitting, ask your health care provider.  Ask your health care provider about wearing special shoes or using inserts to support your foot.  If you have diabetes, keep your blood sugar under control.  Keep all follow-up visits as told by your health care provider. This is important. Contact a health care provider if:  You have more redness around your incision.  You have more fluid or blood coming from your incision.  Your incision feels warm to the touch.  You have pus or a bad smell coming from your incision.  You have a fever.  Your dressing is soaked with blood.  Your sutures tear or they separate.  You have numbness or tingling in your toes or foot.  Your foot is cool or pale, or it changes color.  Your pain does not improve after you take your medicine. Get help right away if:  You have pain or swelling that gets worse or does not go away.  You have red streaks on your skin near your toes, foot, or leg.  You have pain in your calf or behind your knee.  You have shortness of breath.  You have chest pain. This information is not intended to replace advice given to you by your health care provider. Make sure you discuss any questions you have with your health care provider. Document Released: 10/24/2015 Document Revised: 07/16/2016 Document Reviewed: 08/06/2015 Elsevier Interactive Patient Education  2018 [employer].  Monitored Anesthesia Care Anesthesia is a term that refers to techniques, procedures, and medicines that help a person stay  safe and comfortable during a medical procedure. Monitored anesthesia care, or sedation, is one type of anesthesia. Your anesthesia specialist may recommend sedation if you will be having a procedure that does not require you to be unconscious, such as:  Cataract surgery.  A dental procedure.  A biopsy.  A colonoscopy.  During the procedure, you may receive a medicine to help you relax (sedative). There are three levels of sedation:  Mild sedation. At this level, you may feel awake and relaxed. You will be able to follow directions.  Moderate sedation. At this level, you will be sleepy. You may not remember the procedure.  Deep sedation. At this level, you will be asleep. You will not remember the procedure.  The more medicine you are given, the deeper your level of sedation will be. Depending on how you respond to the procedure, the anesthesia specialist may change your level of sedation or the type of anesthesia to fit your needs. An anesthesia specialist will monitor you closely during the procedure. Let your health care provider know about:  Any allergies you have.  All medicines you are taking, including vitamins, herbs, eye drops, creams, and over-the-counter medicines.  Any use of steroids (by mouth or as a cream).  Any problems you or family members have had with sedatives and anesthetic medicines.  Any blood disorders you have.  Any surgeries you have had.  Any medical conditions you have, such as sleep apnea.  Whether you are pregnant or may be pregnant.  Any use of cigarettes, alcohol, or street drugs. What are the risks? Generally, this is a safe procedure. However, problems may occur, including:  Getting too much medicine (oversedation).  Nausea.  Allergic reaction to medicines.  Trouble breathing. If this happens, a breathing tube may be used to help with breathing. It will be removed when you are awake and breathing on your own.  Heart trouble.  Lung  trouble.  Before the procedure Staying hydrated Follow instructions from your health care provider about hydration, which may include:  Up to 2 hours before the procedure - you may continue to drink clear liquids, such as water, clear fruit juice, black coffee, and plain tea.  Eating and drinking restrictions Follow instructions from your health care provider about eating and drinking, which may include:  8 hours before the procedure - stop eating heavy meals or foods such as meat, fried foods, or fatty foods.  6 hours before the procedure - stop eating light meals or foods, such as toast or cereal.  6 hours before the procedure - stop drinking milk or drinks that contain milk.  2 hours before the procedure - stop drinking clear liquids.  Medicines Ask your health care provider about:  Changing or stopping your regular medicines. This is especially important if you are taking diabetes medicines or blood thinners.  Taking medicines such as aspirin and ibuprofen. These medicines can thin your blood. Do not take these medicines before your procedure if your health care provider instructs you not to.  Tests and exams  You will have a physical exam.  You may have blood tests done to show: ? How well your kidneys and liver are working. ? How well your blood can clot.  General instructions  Plan to have someone take you home from the hospital or clinic.  If you will be going home right after the procedure, plan to have someone with you for 24 hours.  What happens during the procedure?  Your blood pressure, heart rate, breathing, level of pain and overall condition will be monitored.  An IV tube will be inserted into one of your veins.  Your anesthesia specialist will give you medicines as needed to keep you comfortable during the procedure. This may mean changing the level of sedation.  The procedure will  be performed. After the procedure  Your blood pressure, heart rate,  breathing rate, and blood oxygen level will be monitored until the medicines you were given have worn off.  Do not drive for 24 hours if you received a sedative.  You may: ? Feel sleepy, clumsy, or nauseous. ? Feel forgetful about what happened after the procedure. ? Have a sore throat if you had a breathing tube during the procedure. ? Vomit. This information is not intended to replace advice given to you by your health care provider. Make sure you discuss any questions you have with your health care provider. Document Released: 08/08/2005 Document Revised: 04/20/2016 Document Reviewed: 03/04/2016 Elsevier Interactive Patient Education  2018 [employer] Monitored Anesthesia Care, Care After These instructions provide you with information about caring for yourself after your procedure. Your health care provider may also give you more specific instructions. Your treatment has been planned according to current medical practices, but problems sometimes occur. Call your health care provider if you have any problems or questions after your procedure. What can I expect after the procedure? After your procedure, it is common to:  Feel sleepy for several hours.  Feel clumsy and have poor balance for several hours.  Feel forgetful about what happened after the procedure.  Have poor judgment for several hours.  Feel nauseous or vomit.  Have a sore throat if you had a breathing tube during the procedure.  Follow these instructions at home: For at least 24 hours after the procedure:   Do not: ? Participate in activities in which you could fall or become injured. ? Drive. ? Use heavy machinery. ? Drink alcohol. ? Take sleeping pills or medicines that cause drowsiness. ? Make important decisions or sign legal documents. ? Take care of children on your own.  Rest. Eating and drinking  Follow the diet that is recommended by your health care provider.  If you vomit, drink water,  juice, or soup when you can drink without vomiting.  Make sure you have little or no nausea before eating solid foods. General instructions  Have a responsible adult stay with you until you are awake and alert.  Take over-the-counter and prescription medicines only as told by your health care provider.  If you smoke, do not smoke without supervision.  Keep all follow-up visits as told by your health care provider. This is important. Contact a health care provider if:  You keep feeling nauseous or you keep vomiting.  You feel light-headed.  You develop a rash.  You have a fever. Get help right away if:  You have trouble breathing. This information is not intended to replace advice given to you by your health care provider. Make sure you discuss any questions you have with your health care provider. Document Released: 03/04/2016 Document Revised: 07/04/2016 Document Reviewed: 03/04/2016 Elsevier Interactive Patient Education  [employer].

## 2018-09-08 ENCOUNTER — Other Ambulatory Visit: Payer: Self-pay

## 2018-09-08 ENCOUNTER — Ambulatory Visit (HOSPITAL_COMMUNITY)
Admission: RE | Admit: 2018-09-08 | Discharge: 2018-09-08 | Disposition: A | Discharge status: Home / Self Care | Payer: Medicare Other | Source: Ambulatory Visit | Attending: Podiatry | Admitting: Podiatry

## 2018-09-08 ENCOUNTER — Other Ambulatory Visit (HOSPITAL_COMMUNITY): Payer: Self-pay | Admitting: Podiatry

## 2018-09-08 ENCOUNTER — Encounter (HOSPITAL_COMMUNITY)
Admission: RE | Admit: 2018-09-08 | Discharge: 2018-09-08 | Disposition: A | Discharge status: Home / Self Care | Payer: Medicare Other | Source: Ambulatory Visit | Attending: Podiatry | Admitting: Podiatry

## 2018-09-08 ENCOUNTER — Encounter (HOSPITAL_COMMUNITY): Payer: Self-pay

## 2018-09-08 DIAGNOSIS — L97529 Non-pressure chronic ulcer of other part of left foot with unspecified severity: Secondary | ICD-10-CM

## 2018-09-08 DIAGNOSIS — Z89422 Acquired absence of other left toe(s): Secondary | ICD-10-CM | POA: Diagnosis not present

## 2018-09-08 DIAGNOSIS — I739 Peripheral vascular disease, unspecified: Secondary | ICD-10-CM | POA: Insufficient documentation

## 2018-09-08 DIAGNOSIS — M19072 Primary osteoarthritis, left ankle and foot: Secondary | ICD-10-CM | POA: Diagnosis not present

## 2018-09-08 LAB — CBC WITH DIFFERENTIAL/PLATELET
Abs Immature Granulocytes: 0.03 10*3/uL (ref 0.00–0.07)
BASOS ABS: 0.1 10*3/uL (ref 0.0–0.1)
Basophils Relative: 1 %
EOS PCT: 5 %
Eosinophils Absolute: 0.3 10*3/uL (ref 0.0–0.5)
HEMATOCRIT: 42.4 % (ref 39.0–52.0)
HEMOGLOBIN: 13.3 g/dL (ref 13.0–17.0)
Immature Granulocytes: 0 %
Lymphocytes Relative: 11 %
Lymphs Abs: 0.7 10*3/uL (ref 0.7–4.0)
MCH: 30 pg (ref 26.0–34.0)
MCHC: 31.4 g/dL (ref 30.0–36.0)
MCV: 95.5 fL (ref 80.0–100.0)
MONO ABS: 0.7 10*3/uL (ref 0.1–1.0)
Monocytes Relative: 10 %
NRBC: 0 % (ref 0.0–0.2)
Neutro Abs: 5.2 10*3/uL (ref 1.7–7.7)
Neutrophils Relative %: 73 %
Platelets: 142 10*3/uL — ABNORMAL LOW (ref 150–400)
RBC: 4.44 MIL/uL (ref 4.22–5.81)
RDW: 15.4 % (ref 11.5–15.5)
WBC: 7 10*3/uL (ref 4.0–10.5)

## 2018-09-08 LAB — BASIC METABOLIC PANEL
Anion gap: 7 (ref 5–15)
BUN: 21 mg/dL (ref 8–23)
CO2: 24 mmol/L (ref 22–32)
CREATININE: 1.39 mg/dL — AB (ref 0.61–1.24)
Calcium: 9 mg/dL (ref 8.9–10.3)
Chloride: 108 mmol/L (ref 98–111)
GFR calc non Af Amer: 48 mL/min — ABNORMAL LOW (ref >60)
GFR, EST AFRICAN AMERICAN: 56 mL/min — AB (ref >60)
Glucose, Bld: 155 mg/dL — ABNORMAL HIGH (ref 70–99)
Potassium: 4.5 mmol/L (ref 3.5–5.1)
Sodium: 139 mmol/L (ref 135–145)

## 2018-09-08 LAB — HEMOGLOBIN A1C
Hgb A1c MFr Bld: 7.6 % — ABNORMAL HIGH (ref 4.8–5.6)
MEAN PLASMA GLUCOSE: 171.42 mg/dL

## 2018-09-08 LAB — GLUCOSE, CAPILLARY: Glucose-Capillary: 159 mg/dL — ABNORMAL HIGH (ref 70–99)

## 2018-09-09 NOTE — Pre-Procedure Instructions (Signed)
HgbA1C routed to PCP and Dr Patel. 

## 2018-09-10 ENCOUNTER — Ambulatory Visit (HOSPITAL_COMMUNITY): Payer: Medicare Other | Admitting: Anesthesiology

## 2018-09-10 ENCOUNTER — Ambulatory Visit (HOSPITAL_COMMUNITY)
Admission: RE | Admit: 2018-09-10 | Discharge: 2018-09-10 | Disposition: A | Discharge status: Home / Self Care | Payer: Medicare Other | Source: Ambulatory Visit | Attending: Podiatry | Admitting: Podiatry

## 2018-09-10 ENCOUNTER — Encounter (HOSPITAL_COMMUNITY): Payer: Self-pay | Admitting: Anesthesiology

## 2018-09-10 ENCOUNTER — Encounter (HOSPITAL_COMMUNITY)
Admission: RE | Disposition: A | Discharge status: Home / Self Care | Payer: Self-pay | Source: Ambulatory Visit | Attending: Podiatry

## 2018-09-10 DIAGNOSIS — Z9841 Cataract extraction status, right eye: Secondary | ICD-10-CM | POA: Insufficient documentation

## 2018-09-10 DIAGNOSIS — L89899 Pressure ulcer of other site, unspecified stage: Secondary | ICD-10-CM | POA: Diagnosis not present

## 2018-09-10 DIAGNOSIS — Z794 Long term (current) use of insulin: Secondary | ICD-10-CM | POA: Insufficient documentation

## 2018-09-10 DIAGNOSIS — M1460 Charcot's joint, unspecified site: Secondary | ICD-10-CM | POA: Insufficient documentation

## 2018-09-10 DIAGNOSIS — Z9889 Other specified postprocedural states: Secondary | ICD-10-CM

## 2018-09-10 DIAGNOSIS — Z8249 Family history of ischemic heart disease and other diseases of the circulatory system: Secondary | ICD-10-CM | POA: Diagnosis not present

## 2018-09-10 DIAGNOSIS — D631 Anemia in chronic kidney disease: Secondary | ICD-10-CM | POA: Diagnosis not present

## 2018-09-10 DIAGNOSIS — E11622 Type 2 diabetes mellitus with other skin ulcer: Secondary | ICD-10-CM | POA: Diagnosis not present

## 2018-09-10 DIAGNOSIS — G4730 Sleep apnea, unspecified: Secondary | ICD-10-CM | POA: Insufficient documentation

## 2018-09-10 DIAGNOSIS — M1990 Unspecified osteoarthritis, unspecified site: Secondary | ICD-10-CM | POA: Diagnosis not present

## 2018-09-10 DIAGNOSIS — Z89422 Acquired absence of other left toe(s): Secondary | ICD-10-CM | POA: Insufficient documentation

## 2018-09-10 DIAGNOSIS — L97529 Non-pressure chronic ulcer of other part of left foot with unspecified severity: Secondary | ICD-10-CM | POA: Diagnosis present

## 2018-09-10 DIAGNOSIS — Z79899 Other long term (current) drug therapy: Secondary | ICD-10-CM | POA: Insufficient documentation

## 2018-09-10 DIAGNOSIS — Z9842 Cataract extraction status, left eye: Secondary | ICD-10-CM | POA: Diagnosis not present

## 2018-09-10 DIAGNOSIS — K219 Gastro-esophageal reflux disease without esophagitis: Secondary | ICD-10-CM | POA: Diagnosis not present

## 2018-09-10 DIAGNOSIS — N189 Chronic kidney disease, unspecified: Secondary | ICD-10-CM | POA: Diagnosis not present

## 2018-09-10 DIAGNOSIS — E1122 Type 2 diabetes mellitus with diabetic chronic kidney disease: Secondary | ICD-10-CM | POA: Diagnosis not present

## 2018-09-10 DIAGNOSIS — Z89511 Acquired absence of right leg below knee: Secondary | ICD-10-CM | POA: Diagnosis not present

## 2018-09-10 HISTORY — PX: AMPUTATION: SHX166

## 2018-09-10 LAB — GLUCOSE, CAPILLARY
Glucose-Capillary: 239 mg/dL — ABNORMAL HIGH (ref 70–99)
Glucose-Capillary: 209 mg/dL — ABNORMAL HIGH (ref 70–99)

## 2018-09-10 SURGERY — AMPUTATION DIGIT
Anesthesia: Monitor Anesthesia Care | Site: Third Toe | Laterality: Left

## 2018-09-10 MED ORDER — 0.9 % SODIUM CHLORIDE (POUR BTL) OPTIME
TOPICAL | Status: DC | PRN
  Administered 2018-09-10: 1000 mL

## 2018-09-10 MED ORDER — PROPOFOL 10 MG/ML IV BOLUS
INTRAVENOUS | Status: AC
  Filled 2018-09-10: qty 40

## 2018-09-10 MED ORDER — FENTANYL CITRATE (PF) 100 MCG/2ML IJ SOLN
INTRAMUSCULAR | Status: AC
  Filled 2018-09-10: qty 2

## 2018-09-10 MED ORDER — LACTATED RINGERS IV SOLN
INTRAVENOUS | Status: DC | PRN
  Administered 2018-09-10: 07:00:00 via INTRAVENOUS

## 2018-09-10 MED ORDER — EPHEDRINE SULFATE 50 MG/ML IJ SOLN
INTRAMUSCULAR | Status: AC
  Filled 2018-09-10: qty 1

## 2018-09-10 MED ORDER — MIDAZOLAM HCL 5 MG/5ML IJ SOLN
INTRAMUSCULAR | Status: DC | PRN
  Administered 2018-09-10: 2 mg via INTRAVENOUS

## 2018-09-10 MED ORDER — MIDAZOLAM HCL 2 MG/2ML IJ SOLN
INTRAMUSCULAR | Status: AC
  Filled 2018-09-10: qty 2

## 2018-09-10 MED ORDER — LIDOCAINE HCL 1 % IJ SOLN
INTRAMUSCULAR | Status: DC | PRN
  Administered 2018-09-10: 10 mL via INTRAMUSCULAR

## 2018-09-10 MED ORDER — SODIUM CHLORIDE 0.9 % IJ SOLN
INTRAMUSCULAR | Status: AC
  Filled 2018-09-10: qty 10

## 2018-09-10 MED ORDER — MEPERIDINE HCL 50 MG/ML IJ SOLN: 6.2500 mg | INTRAMUSCULAR | Status: DC | PRN

## 2018-09-10 MED ORDER — ONDANSETRON HCL 4 MG/2ML IJ SOLN: 4.0000 mg | Freq: Once | INTRAMUSCULAR | Status: DC | PRN

## 2018-09-10 MED ORDER — ONDANSETRON HCL 4 MG/2ML IJ SOLN
INTRAMUSCULAR | Status: DC | PRN
  Administered 2018-09-10: 4 mg via INTRAVENOUS

## 2018-09-10 MED ORDER — PROPOFOL 500 MG/50ML IV EMUL
INTRAVENOUS | Status: DC | PRN
  Administered 2018-09-10: 125 ug/kg/min via INTRAVENOUS
  Administered 2018-09-10: 08:00:00 via INTRAVENOUS

## 2018-09-10 MED ORDER — BUPIVACAINE HCL (PF) 0.5 % IJ SOLN
INTRAMUSCULAR | Status: AC
  Filled 2018-09-10: qty 30

## 2018-09-10 MED ORDER — FENTANYL CITRATE (PF) 100 MCG/2ML IJ SOLN
INTRAMUSCULAR | Status: DC | PRN
  Administered 2018-09-10: 25 ug via INTRAVENOUS

## 2018-09-10 MED ORDER — HYDROCODONE-ACETAMINOPHEN 7.5-325 MG PO TABS: 1.0000 | ORAL_TABLET | Freq: Once | ORAL | Status: DC | PRN

## 2018-09-10 MED ORDER — KETOROLAC TROMETHAMINE 30 MG/ML IJ SOLN: 30.0000 mg | Freq: Once | INTRAMUSCULAR | Status: DC | PRN

## 2018-09-10 MED ORDER — LIDOCAINE HCL (PF) 1 % IJ SOLN
INTRAMUSCULAR | Status: AC
  Filled 2018-09-10: qty 30

## 2018-09-10 MED ORDER — HYDROMORPHONE HCL 1 MG/ML IJ SOLN: 0.2500 mg | INTRAMUSCULAR | Status: DC | PRN

## 2018-09-10 SURGICAL SUPPLY — 39 items
BANDAGE ELASTIC 4 LF NS (GAUZE/BANDAGES/DRESSINGS) ×2 IMPLANT
BANDAGE ESMARK 4X12 BL STRL LF (DISPOSABLE) IMPLANT
BENZOIN TINCTURE PRP APPL 2/3 (GAUZE/BANDAGES/DRESSINGS) IMPLANT
BLADE AVERAGE 25X9 (BLADE) ×2 IMPLANT
BLADE SURG 15 STRL LF DISP TIS (BLADE) ×1 IMPLANT
BLADE SURG 15 STRL SS (BLADE) ×2
BNDG CONFORM 2 STRL LF (GAUZE/BANDAGES/DRESSINGS) ×2 IMPLANT
BNDG ESMARK 4X12 BLUE STRL LF (DISPOSABLE)
BNDG GAUZE ELAST 4 BULKY (GAUZE/BANDAGES/DRESSINGS) ×1 IMPLANT
CLOTH BEACON ORANGE TIMEOUT ST (SAFETY) ×2 IMPLANT
COVER LIGHT HANDLE STERIS (MISCELLANEOUS) ×2 IMPLANT
CUFF TOURNIQUET SINGLE 18IN (TOURNIQUET CUFF) ×2 IMPLANT
DECANTER SPIKE VIAL GLASS SM (MISCELLANEOUS) ×4 IMPLANT
DRSG ADAPTIC 3X8 NADH LF (GAUZE/BANDAGES/DRESSINGS) ×2 IMPLANT
ELECT REM PT RETURN 9FT ADLT (ELECTROSURGICAL) ×2
ELECTRODE REM PT RTRN 9FT ADLT (ELECTROSURGICAL) ×1 IMPLANT
GAUZE SPONGE 4X4 12PLY STRL (GAUZE/BANDAGES/DRESSINGS) ×2 IMPLANT
GLOVE BIO SURGEON STRL SZ7.5 (GLOVE) ×2 IMPLANT
GLOVE BIOGEL PI IND STRL 7.0 (GLOVE) ×2 IMPLANT
GLOVE BIOGEL PI INDICATOR 7.0 (GLOVE) ×2
GLOVE ECLIPSE 6.5 STRL STRAW (GLOVE) ×1 IMPLANT
GLOVE ECLIPSE 7.0 STRL STRAW (GLOVE) ×2 IMPLANT
GOWN STRL REUS W/ TWL XL LVL3 (GOWN DISPOSABLE) ×1 IMPLANT
GOWN STRL REUS W/TWL LRG LVL3 (GOWN DISPOSABLE) ×4 IMPLANT
GOWN STRL REUS W/TWL XL LVL3 (GOWN DISPOSABLE) ×1
KIT TURNOVER KIT A (KITS) ×2 IMPLANT
MANIFOLD NEPTUNE II (INSTRUMENTS) ×2 IMPLANT
NDL HYPO 27GX1-1/4 (NEEDLE) ×2 IMPLANT
NEEDLE HYPO 27GX1-1/4 (NEEDLE) ×4 IMPLANT
NS IRRIG 1000ML POUR BTL (IV SOLUTION) ×2 IMPLANT
PACK BASIC LIMB (CUSTOM PROCEDURE TRAY) ×2 IMPLANT
PAD ABD 5X9 TENDERSORB (GAUZE/BANDAGES/DRESSINGS) ×1 IMPLANT
PAD ARMBOARD 7.5X6 YLW CONV (MISCELLANEOUS) ×2 IMPLANT
SET BASIN LINEN APH (SET/KITS/TRAYS/PACK) ×2 IMPLANT
STRIP CLOSURE SKIN 1/2X4 (GAUZE/BANDAGES/DRESSINGS) ×1 IMPLANT
SUT PROLENE 3 0 PS 1 (SUTURE) ×4 IMPLANT
SUT VIC AB 3-0 SH 27 (SUTURE) ×1
SUT VIC AB 3-0 SH 27X BRD (SUTURE) ×1 IMPLANT
SYR CONTROL 10ML LL (SYRINGE) ×4 IMPLANT

## 2018-09-10 NOTE — H&P (Signed)
.   HISTORY AND PHYSICAL INTERVAL NOTE:  09/10/2018  7:11 AM  Andrew Schultz  has presented today for surgery, with the diagnosis of left toe chronic ulcer.  The various methods of treatment have been discussed with the patient.  No guarantees were given.  After consideration of risks, benefits and other options for treatment, the patient has consented to surgery.  I have reviewed the patients' chart and labs.    Patient Vitals for the past 24 hrs:  BP Temp Temp src Pulse Resp SpO2  09/10/18 0700 115/67 - - - 18 90 %  09/10/18 0648 - 98.2 F (36.8 C) Oral 72 20 (!) 89 %    A history and physical examination was performed in my office.  The patient was reexamined.  There have been no changes to this history and physical examination.  Erskine Emery, DPM

## 2018-09-10 NOTE — Op Note (Signed)
PATIENT:  Andrew Schultz  72 y.o. male  PRE-OPERATIVE DIAGNOSIS:  left third toe chronic ulcer  POST-OPERATIVE DIAGNOSIS:  Left third toe chronic ulcer  PROCEDURE:  Procedure(s): AMPUTATION DIGIT LEFT 3RD TOE (Left)  SURGEON:  Surgeon(s) and Role:    * Chaniya Genter, Layla Barter, DPM - Primary  ANESTHESIA:   local and MAC  EBL:  10 mL   LOCAL MEDICATIONS USED:  MARCAINE   , LIDOCAINE  and Amount: 10 ml  SPECIMEN:  No Specimen  TOURNIQUET:   Total Tourniquet Time Documented: Calf (Left) - 29 minutes Total: Calf (Left) - 29 minutes   Patient was brought into the operating room laid supine on the operating table. Ankle tourniquet was applied to the surgical extremity. Following IV sedation, a local block was achieved using 10 cc of mixture of 1% plain lidocaine with 0.5% marcaine. The foot was the prepped, scrubbed and draped in aseptic manner. Using an esmarch band the tourniquet on the surgical site was inflatted at 241mHG.   Attention was directed towards the left foot. There is chronic ulceration noted at the left 3rd DIPJ secondary to toe rubbing against the left hallux. Left third toe under laps the hallux causing the friction and ulceration. Left hallux is deviated laterally. A racket type incision was planned to remove the left third toe. After the skin incision, the toe was disarticulated at the MPJ and removed. 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 Wound was flushed and the skin was closed using 3-0 Prolene. Dry sterile dressing was applied. Patient will be in surgical shoe with limited weightbearing.

## 2018-09-10 NOTE — Transfer of Care (Signed)
Immediate Anesthesia Transfer of Care Note  Patient: Andrew Schultz  Procedure(s) Performed: AMPUTATION DIGIT LEFT 3RD TOE (Left Third Toe)  Patient Location: PACU  Anesthesia Type:MAC  Level of Consciousness: drowsy and patient cooperative  Airway & Oxygen Therapy: Patient Spontanous Breathing and Patient connected to nasal cannula oxygen  Post-op Assessment: Report given to RN, Post -op Vital signs reviewed and stable and Patient moving all extremities  Post vital signs: Reviewed and stable  Last Vitals:  Vitals Value Taken Time  BP    Temp    Pulse 40 09/10/2018  8:31 AM  Resp    SpO2 80 % 09/10/2018  8:31 AM  Vitals shown include unvalidated device data.  Last Pain:  Vitals:   09/10/18 0648  TempSrc: Oral  PainSc: 0-No pain         Complications: No apparent anesthesia complications

## 2018-09-10 NOTE — Progress Notes (Signed)
Telephone call to Dr. Allena Katz regarding any need for pain prescription. "He does not need a pain medication prescription". Patient Advised.

## 2018-09-10 NOTE — Discharge Instructions (Signed)
.These instructions will give you an idea of what to expect after surgery and how to manage issues that may arise before your first post op office visit. ° °Pain Management °Pain is best managed by “staying ahead” of it. If pain gets out of control, it is difficult to get it back under control. Local anesthesia that lasts 6-8 hours is used to numb the foot and decrease pain.  For the best pain control, take the pain medication every 4 hours for the first 2 days post op. On the third day pain medication can be taken as needed.  ° °Post Op Nausea °Nausea is common after surgery, so it is managed proactively.  °If prescribed, use the prescribed nausea medication regularly for the first 2 days post op. ° °Bandages °Do not worry if there is blood on the bandage. What looks like a lot of blood on the bandage is actually a small amount. Blood on the dressing spreads out as it is absorbed by the gauze, the same way a drop of water spreads out on a paper towel.  °If the bandages feel wet or dry, stiff and uncomfortable, call the office during office hours and we will schedule a time for you to have the bandage changed.  °Unless you are specifically told otherwise, we will do the first bandage change in the office.  °Keep your bandage dry. If the bandage becomes wet or soiled, notify the office and we will schedule a time to change the bandage. ° °Activity °It is best to spend most of the first 2 days after surgery lying down with the foot elevated above the level of your heart. °You may put weight on your heel while wearing the surgical shoe.   °You may only get up to go to the restroom. ° °Driving °Do not drive until you are able to respond in an emergency (i.e. slam on the brakes). This usually occurs after the bone has healed - 6 to 8 weeks. ° °Call the Office °If you have a fever over 101°F.  °If you have increasing pain after the initial post op pain has settled down.  °If you have increasing redness, swelling, or  drainage.  °If you have any questions or concerns.  ° ° ° ° °Monitored Anesthesia Care, Care After °These instructions provide you with information about caring for yourself after your procedure. Your health care provider may also give you more specific instructions. Your treatment has been planned according to current medical practices, but problems sometimes occur. Call your health care provider if you have any problems or questions after your procedure. °What can I expect after the procedure? °After your procedure, it is common to: °· Feel sleepy for several hours. °· Feel clumsy and have poor balance for several hours. °· Feel forgetful about what happened after the procedure. °· Have poor judgment for several hours. °· Feel nauseous or vomit. °· Have a sore throat if you had a breathing tube during the procedure. ° °Follow these instructions at home: °For at least 24 hours after the procedure: ° °· Do not: °? Participate in activities in which you could fall or become injured. °? Drive. °? Use heavy machinery. °? Drink alcohol. °? Take sleeping pills or medicines that cause drowsiness. °? Make important decisions or sign legal documents. °? Take care of children on your own. °· Rest. °Eating and drinking °· Follow the diet that is recommended by your health care provider. °· If you vomit, drink water, juice,   or soup when you can drink without vomiting. °· Make sure you have little or no nausea before eating solid foods. °General instructions °· Have a responsible adult stay with you until you are awake and alert. °· Take over-the-counter and prescription medicines only as told by your health care provider. °· If you smoke, do not smoke without supervision. °· Keep all follow-up visits as told by your health care provider. This is important. °Contact a health care provider if: °· You keep feeling nauseous or you keep vomiting. °· You feel light-headed. °· You develop a rash. °· You have a fever. °Get help right  away if: °· You have trouble breathing. °This information is not intended to replace advice given to you by your health care provider. Make sure you discuss any questions you have with your health care provider. °Document Released: 03/04/2016 Document Revised: 07/04/2016 Document Reviewed: 03/04/2016 °Elsevier Interactive Patient Education © 2018 [employer] ° °

## 2018-09-10 NOTE — Anesthesia Preprocedure Evaluation (Addendum)
Anesthesia Evaluation  Patient identified by MRN, date of birth, ID band Patient awake    Reviewed: Allergy & Precautions, H&P , NPO status , Patient's Chart, lab work & pertinent test results  Airway Mallampati: II  TM Distance: >3 FB Neck ROM: full    Dental no notable dental hx.    Pulmonary sleep apnea ,    Pulmonary exam normal breath sounds clear to auscultation       Cardiovascular Exercise Tolerance: Good negative cardio ROS   Rhythm:regular Rate:Normal     Neuro/Psych negative neurological ROS  negative psych ROS   GI/Hepatic Neg liver ROS, GERD  ,  Endo/Other  negative endocrine ROSdiabetes  Renal/GU Renal disease  negative genitourinary   Musculoskeletal   Abdominal   Peds  Hematology negative hematology ROS (+)   Anesthesia Other Findings   Reproductive/Obstetrics negative OB ROS                            Anesthesia Physical Anesthesia Plan  ASA: III  Anesthesia Plan: MAC   Post-op Pain Management:    Induction:   PONV Risk Score and Plan:   Airway Management Planned:   Additional Equipment:   Intra-op Plan:   Post-operative Plan:   Informed Consent: I have reviewed the patients History and Physical, chart, labs and discussed the procedure including the risks, benefits and alternatives for the proposed anesthesia with the patient or authorized representative who has indicated his/her understanding and acceptance.     Plan Discussed with: CRNA  Anesthesia Plan Comments:         Anesthesia Quick Evaluation

## 2018-09-10 NOTE — Brief Op Note (Signed)
09/10/2018  8:25 AM  PATIENT:  Andrew Schultz  72 y.o. male  PRE-OPERATIVE DIAGNOSIS:  left third toe chronic ulcer  POST-OPERATIVE DIAGNOSIS:  Left third toe chronic ulcer  PROCEDURE:  Procedure(s): AMPUTATION DIGIT LEFT 3RD TOE (Left)  SURGEON:  Surgeon(s) and Role:    * Jeronimo Hellberg, Layla Barter, DPM - Primary  ANESTHESIA:   local and MAC  EBL:  10 mL   BLOOD ADMINISTERED:none  DRAINS: none   LOCAL MEDICATIONS USED:  MARCAINE   , LIDOCAINE  and Amount: 10 ml  SPECIMEN:  No Specimen  DISPOSITION OF SPECIMEN:  N/A  COUNTS:  YES  TOURNIQUET:   Total Tourniquet Time Documented: Calf (Left) - 29 minutes Total: Calf (Left) - 29 minutes   DICTATION: .Viviann Spare Dictation  PLAN OF CARE: Discharge to home after PACU  PATIENT DISPOSITION:  PACU - hemodynamically stable.   Delay start of Pharmacological VTE agent (>24hrs) due to surgical blood loss or risk of bleeding: not applicable

## 2018-09-10 NOTE — Anesthesia Postprocedure Evaluation (Signed)
Anesthesia Post Note  Patient: Andrew Schultz  Procedure(s) Performed: AMPUTATION DIGIT LEFT 3RD TOE (Left Third Toe)  Patient location during evaluation: PACU Anesthesia Type: MAC Level of consciousness: awake and patient cooperative Pain management: pain level controlled Vital Signs Assessment: post-procedure vital signs reviewed and stable Respiratory status: spontaneous breathing, nonlabored ventilation and respiratory function stable Cardiovascular status: blood pressure returned to baseline Postop Assessment: no apparent nausea or vomiting Anesthetic complications: no     Last Vitals:  Vitals:   09/10/18 0715 09/10/18 0831  BP:    Pulse:    Resp: 15   Temp:  36.7 C  SpO2: 92% 96%    Last Pain:  Vitals:   09/10/18 0831  TempSrc:   PainSc: Asleep                 Britnie Colville J

## 2018-09-11 ENCOUNTER — Encounter (HOSPITAL_COMMUNITY): Payer: Self-pay | Admitting: Podiatry

## 2018-12-22 ENCOUNTER — Encounter (HOSPITAL_BASED_OUTPATIENT_CLINIC_OR_DEPARTMENT_OTHER): Payer: Medicare Other | Attending: Internal Medicine

## 2018-12-22 DIAGNOSIS — E1042 Type 1 diabetes mellitus with diabetic polyneuropathy: Secondary | ICD-10-CM | POA: Diagnosis not present

## 2018-12-22 DIAGNOSIS — L97522 Non-pressure chronic ulcer of other part of left foot with fat layer exposed: Secondary | ICD-10-CM | POA: Insufficient documentation

## 2018-12-22 DIAGNOSIS — E10621 Type 1 diabetes mellitus with foot ulcer: Secondary | ICD-10-CM | POA: Diagnosis not present

## 2018-12-22 DIAGNOSIS — G4730 Sleep apnea, unspecified: Secondary | ICD-10-CM | POA: Insufficient documentation

## 2018-12-22 DIAGNOSIS — Z89511 Acquired absence of right leg below knee: Secondary | ICD-10-CM | POA: Diagnosis not present

## 2018-12-22 DIAGNOSIS — Z794 Long term (current) use of insulin: Secondary | ICD-10-CM | POA: Insufficient documentation

## 2018-12-22 DIAGNOSIS — Z89422 Acquired absence of other left toe(s): Secondary | ICD-10-CM | POA: Diagnosis not present

## 2018-12-22 DIAGNOSIS — E10610 Type 1 diabetes mellitus with diabetic neuropathic arthropathy: Secondary | ICD-10-CM | POA: Insufficient documentation

## 2018-12-22 DIAGNOSIS — E1051 Type 1 diabetes mellitus with diabetic peripheral angiopathy without gangrene: Secondary | ICD-10-CM | POA: Insufficient documentation

## 2019-01-05 ENCOUNTER — Encounter (HOSPITAL_BASED_OUTPATIENT_CLINIC_OR_DEPARTMENT_OTHER): Payer: Medicare Other | Attending: Internal Medicine

## 2019-01-05 DIAGNOSIS — G4730 Sleep apnea, unspecified: Secondary | ICD-10-CM | POA: Diagnosis not present

## 2019-01-05 DIAGNOSIS — E10621 Type 1 diabetes mellitus with foot ulcer: Secondary | ICD-10-CM | POA: Diagnosis not present

## 2019-01-05 DIAGNOSIS — M069 Rheumatoid arthritis, unspecified: Secondary | ICD-10-CM | POA: Diagnosis not present

## 2019-01-05 DIAGNOSIS — Z89511 Acquired absence of right leg below knee: Secondary | ICD-10-CM | POA: Diagnosis not present

## 2019-01-05 DIAGNOSIS — Z89422 Acquired absence of other left toe(s): Secondary | ICD-10-CM | POA: Insufficient documentation

## 2019-01-05 DIAGNOSIS — E10610 Type 1 diabetes mellitus with diabetic neuropathic arthropathy: Secondary | ICD-10-CM | POA: Diagnosis not present

## 2019-01-05 DIAGNOSIS — E1040 Type 1 diabetes mellitus with diabetic neuropathy, unspecified: Secondary | ICD-10-CM | POA: Diagnosis not present

## 2019-01-05 DIAGNOSIS — L97528 Non-pressure chronic ulcer of other part of left foot with other specified severity: Secondary | ICD-10-CM | POA: Insufficient documentation

## 2019-01-06 ENCOUNTER — Ambulatory Visit (HOSPITAL_COMMUNITY)
Admission: RE | Admit: 2019-01-06 | Discharge: 2019-01-06 | Disposition: A | Discharge status: Home / Self Care | Payer: Medicare Other | Source: Ambulatory Visit | Attending: Internal Medicine | Admitting: Internal Medicine

## 2019-01-06 ENCOUNTER — Other Ambulatory Visit (HOSPITAL_COMMUNITY): Payer: Self-pay | Admitting: Internal Medicine

## 2019-01-06 DIAGNOSIS — L97509 Non-pressure chronic ulcer of other part of unspecified foot with unspecified severity: Principal | ICD-10-CM

## 2019-01-06 DIAGNOSIS — E10621 Type 1 diabetes mellitus with foot ulcer: Secondary | ICD-10-CM | POA: Diagnosis not present

## 2019-01-12 DIAGNOSIS — E10621 Type 1 diabetes mellitus with foot ulcer: Secondary | ICD-10-CM | POA: Diagnosis not present

## 2019-01-13 ENCOUNTER — Other Ambulatory Visit (HOSPITAL_COMMUNITY): Payer: Self-pay | Admitting: Internal Medicine

## 2019-01-13 ENCOUNTER — Ambulatory Visit (HOSPITAL_COMMUNITY)
Admission: RE | Admit: 2019-01-13 | Discharge: 2019-01-13 | Disposition: A | Discharge status: Home / Self Care | Payer: Medicare Other | Source: Ambulatory Visit | Attending: Vascular Surgery | Admitting: Vascular Surgery

## 2019-01-13 DIAGNOSIS — L97509 Non-pressure chronic ulcer of other part of unspecified foot with unspecified severity: Secondary | ICD-10-CM | POA: Diagnosis not present

## 2019-01-13 DIAGNOSIS — E10621 Type 1 diabetes mellitus with foot ulcer: Secondary | ICD-10-CM

## 2019-01-20 DIAGNOSIS — E10621 Type 1 diabetes mellitus with foot ulcer: Secondary | ICD-10-CM | POA: Diagnosis not present

## 2019-02-03 ENCOUNTER — Encounter (HOSPITAL_BASED_OUTPATIENT_CLINIC_OR_DEPARTMENT_OTHER): Payer: Medicare Other | Attending: Internal Medicine

## 2019-02-03 DIAGNOSIS — L97529 Non-pressure chronic ulcer of other part of left foot with unspecified severity: Secondary | ICD-10-CM | POA: Diagnosis present

## 2019-02-03 DIAGNOSIS — E1142 Type 2 diabetes mellitus with diabetic polyneuropathy: Secondary | ICD-10-CM | POA: Diagnosis present

## 2019-02-03 DIAGNOSIS — G4733 Obstructive sleep apnea (adult) (pediatric): Secondary | ICD-10-CM | POA: Diagnosis present

## 2019-02-03 DIAGNOSIS — Z89511 Acquired absence of right leg below knee: Secondary | ICD-10-CM

## 2019-02-03 DIAGNOSIS — Z9713 Presence of artificial right leg (complete) (partial): Secondary | ICD-10-CM

## 2019-02-03 DIAGNOSIS — E1169 Type 2 diabetes mellitus with other specified complication: Principal | ICD-10-CM | POA: Diagnosis present

## 2019-02-03 DIAGNOSIS — M069 Rheumatoid arthritis, unspecified: Secondary | ICD-10-CM | POA: Diagnosis present

## 2019-02-03 DIAGNOSIS — E1165 Type 2 diabetes mellitus with hyperglycemia: Secondary | ICD-10-CM | POA: Diagnosis present

## 2019-02-03 DIAGNOSIS — E1122 Type 2 diabetes mellitus with diabetic chronic kidney disease: Secondary | ICD-10-CM | POA: Diagnosis present

## 2019-02-03 DIAGNOSIS — Z794 Long term (current) use of insulin: Secondary | ICD-10-CM | POA: Insufficient documentation

## 2019-02-03 DIAGNOSIS — Z79899 Other long term (current) drug therapy: Secondary | ICD-10-CM

## 2019-02-03 DIAGNOSIS — Z89422 Acquired absence of other left toe(s): Secondary | ICD-10-CM

## 2019-02-03 DIAGNOSIS — Z9641 Presence of insulin pump (external) (internal): Secondary | ICD-10-CM | POA: Diagnosis present

## 2019-02-03 DIAGNOSIS — Z89432 Acquired absence of left foot: Secondary | ICD-10-CM | POA: Insufficient documentation

## 2019-02-03 DIAGNOSIS — L97525 Non-pressure chronic ulcer of other part of left foot with muscle involvement without evidence of necrosis: Secondary | ICD-10-CM | POA: Insufficient documentation

## 2019-02-03 DIAGNOSIS — Z89421 Acquired absence of other right toe(s): Secondary | ICD-10-CM | POA: Insufficient documentation

## 2019-02-03 DIAGNOSIS — K219 Gastro-esophageal reflux disease without esophagitis: Secondary | ICD-10-CM | POA: Diagnosis present

## 2019-02-03 DIAGNOSIS — L97519 Non-pressure chronic ulcer of other part of right foot with unspecified severity: Secondary | ICD-10-CM | POA: Diagnosis present

## 2019-02-03 DIAGNOSIS — E1042 Type 1 diabetes mellitus with diabetic polyneuropathy: Secondary | ICD-10-CM | POA: Insufficient documentation

## 2019-02-03 DIAGNOSIS — N183 Chronic kidney disease, stage 3 (moderate): Secondary | ICD-10-CM | POA: Diagnosis present

## 2019-02-03 DIAGNOSIS — E10610 Type 1 diabetes mellitus with diabetic neuropathic arthropathy: Secondary | ICD-10-CM | POA: Insufficient documentation

## 2019-02-03 DIAGNOSIS — L03116 Cellulitis of left lower limb: Secondary | ICD-10-CM | POA: Diagnosis present

## 2019-02-03 DIAGNOSIS — M86172 Other acute osteomyelitis, left ankle and foot: Secondary | ICD-10-CM | POA: Diagnosis present

## 2019-02-03 DIAGNOSIS — M869 Osteomyelitis, unspecified: Secondary | ICD-10-CM | POA: Diagnosis not present

## 2019-02-03 DIAGNOSIS — M009 Pyogenic arthritis, unspecified: Secondary | ICD-10-CM | POA: Diagnosis present

## 2019-02-03 DIAGNOSIS — Z7982 Long term (current) use of aspirin: Secondary | ICD-10-CM

## 2019-02-03 DIAGNOSIS — E1051 Type 1 diabetes mellitus with diabetic peripheral angiopathy without gangrene: Secondary | ICD-10-CM | POA: Insufficient documentation

## 2019-02-03 DIAGNOSIS — Z89411 Acquired absence of right great toe: Secondary | ICD-10-CM | POA: Insufficient documentation

## 2019-02-03 DIAGNOSIS — G4730 Sleep apnea, unspecified: Secondary | ICD-10-CM | POA: Insufficient documentation

## 2019-02-03 DIAGNOSIS — E11621 Type 2 diabetes mellitus with foot ulcer: Secondary | ICD-10-CM | POA: Diagnosis present

## 2019-02-03 DIAGNOSIS — E10621 Type 1 diabetes mellitus with foot ulcer: Secondary | ICD-10-CM | POA: Insufficient documentation

## 2019-02-05 ENCOUNTER — Inpatient Hospital Stay (HOSPITAL_COMMUNITY)
Admission: EM | Admit: 2019-02-05 | Discharge: 2019-02-07 | DRG: 638 | Disposition: A | Discharge status: Home Health | Payer: Medicare Other | Attending: Internal Medicine | Admitting: Internal Medicine

## 2019-02-05 ENCOUNTER — Other Ambulatory Visit: Payer: Self-pay

## 2019-02-05 ENCOUNTER — Emergency Department (HOSPITAL_COMMUNITY): Payer: Medicare Other

## 2019-02-05 ENCOUNTER — Encounter (HOSPITAL_COMMUNITY): Payer: Self-pay

## 2019-02-05 DIAGNOSIS — E1169 Type 2 diabetes mellitus with other specified complication: Secondary | ICD-10-CM | POA: Diagnosis present

## 2019-02-05 DIAGNOSIS — M009 Pyogenic arthritis, unspecified: Secondary | ICD-10-CM | POA: Diagnosis present

## 2019-02-05 DIAGNOSIS — E1165 Type 2 diabetes mellitus with hyperglycemia: Secondary | ICD-10-CM | POA: Diagnosis present

## 2019-02-05 DIAGNOSIS — E1142 Type 2 diabetes mellitus with diabetic polyneuropathy: Secondary | ICD-10-CM | POA: Diagnosis present

## 2019-02-05 DIAGNOSIS — E1049 Type 1 diabetes mellitus with other diabetic neurological complication: Secondary | ICD-10-CM

## 2019-02-05 DIAGNOSIS — M86172 Other acute osteomyelitis, left ankle and foot: Secondary | ICD-10-CM | POA: Diagnosis present

## 2019-02-05 DIAGNOSIS — G4733 Obstructive sleep apnea (adult) (pediatric): Secondary | ICD-10-CM | POA: Diagnosis present

## 2019-02-05 DIAGNOSIS — M069 Rheumatoid arthritis, unspecified: Secondary | ICD-10-CM | POA: Diagnosis present

## 2019-02-05 DIAGNOSIS — E1121 Type 2 diabetes mellitus with diabetic nephropathy: Secondary | ICD-10-CM | POA: Diagnosis present

## 2019-02-05 DIAGNOSIS — Z9641 Presence of insulin pump (external) (internal): Secondary | ICD-10-CM | POA: Diagnosis present

## 2019-02-05 DIAGNOSIS — E08621 Diabetes mellitus due to underlying condition with foot ulcer: Secondary | ICD-10-CM | POA: Diagnosis not present

## 2019-02-05 DIAGNOSIS — L97509 Non-pressure chronic ulcer of other part of unspecified foot with unspecified severity: Secondary | ICD-10-CM

## 2019-02-05 DIAGNOSIS — L97409 Non-pressure chronic ulcer of unspecified heel and midfoot with unspecified severity: Secondary | ICD-10-CM

## 2019-02-05 DIAGNOSIS — E1140 Type 2 diabetes mellitus with diabetic neuropathy, unspecified: Secondary | ICD-10-CM | POA: Diagnosis present

## 2019-02-05 DIAGNOSIS — L97529 Non-pressure chronic ulcer of other part of left foot with unspecified severity: Secondary | ICD-10-CM | POA: Diagnosis present

## 2019-02-05 DIAGNOSIS — K219 Gastro-esophageal reflux disease without esophagitis: Secondary | ICD-10-CM | POA: Diagnosis present

## 2019-02-05 DIAGNOSIS — E11621 Type 2 diabetes mellitus with foot ulcer: Secondary | ICD-10-CM | POA: Diagnosis present

## 2019-02-05 DIAGNOSIS — Z7982 Long term (current) use of aspirin: Secondary | ICD-10-CM | POA: Diagnosis not present

## 2019-02-05 DIAGNOSIS — Z794 Long term (current) use of insulin: Secondary | ICD-10-CM | POA: Diagnosis not present

## 2019-02-05 DIAGNOSIS — M869 Osteomyelitis, unspecified: Secondary | ICD-10-CM | POA: Diagnosis present

## 2019-02-05 DIAGNOSIS — L97519 Non-pressure chronic ulcer of other part of right foot with unspecified severity: Secondary | ICD-10-CM | POA: Diagnosis present

## 2019-02-05 DIAGNOSIS — Z9713 Presence of artificial right leg (complete) (partial): Secondary | ICD-10-CM | POA: Diagnosis not present

## 2019-02-05 DIAGNOSIS — L03116 Cellulitis of left lower limb: Secondary | ICD-10-CM | POA: Diagnosis present

## 2019-02-05 DIAGNOSIS — Z9989 Dependence on other enabling machines and devices: Secondary | ICD-10-CM

## 2019-02-05 DIAGNOSIS — Z79899 Other long term (current) drug therapy: Secondary | ICD-10-CM | POA: Diagnosis not present

## 2019-02-05 DIAGNOSIS — E1122 Type 2 diabetes mellitus with diabetic chronic kidney disease: Secondary | ICD-10-CM | POA: Diagnosis present

## 2019-02-05 DIAGNOSIS — Z89422 Acquired absence of other left toe(s): Secondary | ICD-10-CM | POA: Diagnosis not present

## 2019-02-05 DIAGNOSIS — N183 Chronic kidney disease, stage 3 (moderate): Secondary | ICD-10-CM | POA: Diagnosis present

## 2019-02-05 DIAGNOSIS — Z89511 Acquired absence of right leg below knee: Secondary | ICD-10-CM | POA: Diagnosis not present

## 2019-02-05 LAB — CBC WITH DIFFERENTIAL/PLATELET
Abs Immature Granulocytes: 0.02 10*3/uL (ref 0.00–0.07)
Basophils Absolute: 0 10*3/uL (ref 0.0–0.1)
Basophils Relative: 0 %
EOS ABS: 0.1 10*3/uL (ref 0.0–0.5)
Eosinophils Relative: 2 %
HCT: 39.9 % (ref 39.0–52.0)
Hemoglobin: 12.9 g/dL — ABNORMAL LOW (ref 13.0–17.0)
Immature Granulocytes: 0 %
Lymphocytes Relative: 9 %
Lymphs Abs: 0.5 10*3/uL — ABNORMAL LOW (ref 0.7–4.0)
MCH: 29.5 pg (ref 26.0–34.0)
MCHC: 32.3 g/dL (ref 30.0–36.0)
MCV: 91.1 fL (ref 80.0–100.0)
Monocytes Absolute: 0.6 10*3/uL (ref 0.1–1.0)
Monocytes Relative: 11 %
Neutro Abs: 4.4 10*3/uL (ref 1.7–7.7)
Neutrophils Relative %: 78 %
Platelets: 146 10*3/uL — ABNORMAL LOW (ref 150–400)
RBC: 4.38 MIL/uL (ref 4.22–5.81)
RDW: 15.4 % (ref 11.5–15.5)
WBC: 5.7 10*3/uL (ref 4.0–10.5)
nRBC: 0 % (ref 0.0–0.2)

## 2019-02-05 LAB — BASIC METABOLIC PANEL
ANION GAP: 8 (ref 5–15)
BUN: 22 mg/dL (ref 8–23)
CALCIUM: 8.8 mg/dL — AB (ref 8.9–10.3)
CO2: 25 mmol/L (ref 22–32)
Chloride: 99 mmol/L (ref 98–111)
Creatinine, Ser: 1.36 mg/dL — ABNORMAL HIGH (ref 0.61–1.24)
GFR calc Af Amer: 59 mL/min — ABNORMAL LOW (ref >60)
GFR calc non Af Amer: 51 mL/min — ABNORMAL LOW (ref >60)
Glucose, Bld: 242 mg/dL — ABNORMAL HIGH (ref 70–99)
Potassium: 4.7 mmol/L (ref 3.5–5.1)
Sodium: 132 mmol/L — ABNORMAL LOW (ref 135–145)

## 2019-02-05 MED ORDER — VANCOMYCIN HCL IN DEXTROSE 750-5 MG/150ML-% IV SOLN
750.0000 mg | Freq: Two times a day (BID) | INTRAVENOUS | Status: DC
  Administered 2019-02-06 – 2019-02-07 (×4): 750 mg via INTRAVENOUS
  Filled 2019-02-05 (×4): qty 150

## 2019-02-05 MED ORDER — ONDANSETRON HCL 4 MG/2ML IJ SOLN: 4.0000 mg | Freq: Four times a day (QID) | INTRAMUSCULAR | Status: DC | PRN

## 2019-02-05 MED ORDER — ENOXAPARIN SODIUM 40 MG/0.4ML ~~LOC~~ SOLN
40.0000 mg | SUBCUTANEOUS | Status: DC
  Administered 2019-02-05 – 2019-02-06 (×2): 40 mg via SUBCUTANEOUS
  Filled 2019-02-05 (×2): qty 0.4

## 2019-02-05 MED ORDER — METHOTREXATE 2.5 MG PO TABS
10.0000 mg | ORAL_TABLET | ORAL | Status: DC
  Filled 2019-02-05: qty 4

## 2019-02-05 MED ORDER — ACETAMINOPHEN 650 MG RE SUPP: 650.0000 mg | Freq: Four times a day (QID) | RECTAL | Status: DC | PRN

## 2019-02-05 MED ORDER — PANTOPRAZOLE SODIUM 40 MG PO TBEC
40.0000 mg | DELAYED_RELEASE_TABLET | Freq: Every day | ORAL | Status: DC
  Administered 2019-02-07: 40 mg via ORAL
  Filled 2019-02-05: qty 1

## 2019-02-05 MED ORDER — VITAMIN D 25 MCG (1000 UNIT) PO TABS
2000.0000 [IU] | ORAL_TABLET | Freq: Every day | ORAL | Status: DC
  Administered 2019-02-07: 2000 [IU] via ORAL
  Filled 2019-02-05: qty 2

## 2019-02-05 MED ORDER — INSULIN ASPART 100 UNIT/ML ~~LOC~~ SOLN: 0.0000 [IU] | Freq: Every day | SUBCUTANEOUS | Status: DC

## 2019-02-05 MED ORDER — VANCOMYCIN HCL IN DEXTROSE 1-5 GM/200ML-% IV SOLN
1000.0000 mg | Freq: Once | INTRAVENOUS | Status: AC
  Administered 2019-02-05: 1000 mg via INTRAVENOUS
  Filled 2019-02-05: qty 200

## 2019-02-05 MED ORDER — ONDANSETRON HCL 4 MG PO TABS: 4.0000 mg | ORAL_TABLET | Freq: Four times a day (QID) | ORAL | Status: DC | PRN

## 2019-02-05 MED ORDER — VITAMIN E 45 MG (100 UNIT) PO CAPS
1000.0000 [IU] | ORAL_CAPSULE | Freq: Every day | ORAL | Status: DC
  Filled 2019-02-05 (×4): qty 2

## 2019-02-05 MED ORDER — ADULT MULTIVITAMIN W/MINERALS CH
1.0000 | ORAL_TABLET | Freq: Every morning | ORAL | Status: DC
  Administered 2019-02-07: 1 via ORAL
  Filled 2019-02-05: qty 1

## 2019-02-05 MED ORDER — ASPIRIN EC 81 MG PO TBEC
81.0000 mg | DELAYED_RELEASE_TABLET | Freq: Every day | ORAL | Status: DC
  Administered 2019-02-07: 81 mg via ORAL
  Filled 2019-02-05: qty 1

## 2019-02-05 MED ORDER — FLAX SEED OIL 1000 MG PO CAPS: 1200.0000 mg | ORAL_CAPSULE | Freq: Every day | ORAL | Status: DC

## 2019-02-05 MED ORDER — POLYVINYL ALCOHOL 1.4 % OP SOLN: 1.0000 [drp] | Freq: Four times a day (QID) | OPHTHALMIC | Status: DC | PRN

## 2019-02-05 MED ORDER — INSULIN ASPART 100 UNIT/ML ~~LOC~~ SOLN: 0.0000 [IU] | Freq: Three times a day (TID) | SUBCUTANEOUS | Status: DC

## 2019-02-05 MED ORDER — LEFLUNOMIDE 10 MG PO TABS
10.0000 mg | ORAL_TABLET | Freq: Every day | ORAL | Status: DC
  Administered 2019-02-07: 10 mg via ORAL
  Filled 2019-02-05 (×4): qty 1

## 2019-02-05 MED ORDER — ACETAMINOPHEN 325 MG PO TABS: 650.0000 mg | ORAL_TABLET | Freq: Four times a day (QID) | ORAL | Status: DC | PRN

## 2019-02-05 NOTE — Progress Notes (Signed)
PHARMACIST - PHYSICIAN ORDER COMMUNICATION  CONCERNING: P&T Medication Policy on Herbal Medications  DESCRIPTION:  This patient's order for:  Flax Seed Oil  has been noted.  This product(s) is classified as an "herbal" or natural product. Due to a lack of definitive safety studies or FDA approval, nonstandard manufacturing practices, plus the potential risk of unknown drug-drug interactions while on inpatient medications, the Pharmacy and Therapeutics Committee does not permit the use of "herbal" or natural products of this type within [facility].   ACTION TAKEN: The pharmacy department is unable to verify this order at this time and your patient has been informed of this safety policy. Please reevaluate patient's clinical condition at discharge and address if the herbal or natural product(s) should be resumed at that time.   

## 2019-02-05 NOTE — ED Triage Notes (Signed)
Pt presents to ED with complaints of fever x 2 days with high of 100.6. Pt states he been treated for URI. Pt is also being treated by wound care for wound on left foot and was told by wound center if develop fever to come to ED to have foot evaluated.

## 2019-02-05 NOTE — ED Notes (Signed)
Patient transported to X-ray 

## 2019-02-05 NOTE — H&P (Signed)
[facility] H&P   Patient Demographics:    Andrew Schultz, is a 73 y.o. male  MRN: [id number]   DOB - 12-24-1945  Admit Date - 02/05/2019  Outpatient Primary MD for the patient is [name], Saul Z, MD  Referring MD: Dr. Judd Lienelo  Outpatient Specialists:: Dr. [name] (podiatry), Dr. Leanord Hawkingobson (wound care clinic)  Patient coming from: Home  Chief Complaint  Patient presents with  . Fever      HPI:    Andrew Schultz  is a 73 y.o. male, with history of diabetes mellitus on insulin pump, peripheral neuropathy, rheumatoid arthritis, chronic kidney disease stage?  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  He reports that last week he did have some URI symptoms and had fever of 101 F about 4 days back.  He developed an ulcer over the medial aspect of the left foot almost 4 months back and was referred by his podiatry to wound care clinic.  He noticed some warmth over his ulcer and given development of fever decided to come to the ED. He denies any trauma to the leg reports some chills but denies headache, blurred vision, dizziness, nausea, vomiting, chest pain, shortness of breath, abdominal pain, dysuria, diarrhea, weakness of the extremities.  Denies any sick contact or recent travel. Course in the ED Vitals were stable.  Blood work showed WBC of 5.7, hemoglobin of 12.9, platelet of 146, sodium of 132, creatinine of 1.36, glucose of 236.  X-ray of the left foot showed acute osteomyelitis at the medial head of the first metatarsal and base of the first proximal phalanx.  Also showed chronic soft tissue wound with new soft tissue gas at the dorsum of the foot. Patient given a dose of IV vancomycin and hospitalist  consulted for admission to medical floor.      Review of systems:    In addition to the HPI above, Fever and chills + + No Headache, No changes with Vision or hearing, No problems swallowing food or Liquids, No Chest pain, Cough or Shortness of Breath, No Abdominal pain, No Nausea or vomiting, bowel movements are regular, No Blood in stool or Urine, No dysuria, No new skin rashes or bruises, warmth over left foot ulcer site No new joints pains-aches,  No new weakness, tingling, numbness in any extremity, No recent weight gain or loss, No polyuria, polydypsia or polyphagia, No significant Mental Stressors.     With Past History of the following :    Past Medical History:  Diagnosis Date  . Apnea, sleep   . CKD (chronic kidney disease)   . Diabetes mellitus without complication (HCC)   . GERD (gastroesophageal reflux disease)   .  Hyperkalemia   . Neuropathy   . Obstructive sleep apnea on CPAP   . RA (rheumatoid arthritis) (HCC)   . Renal disorder       Past Surgical History:  Procedure Laterality Date  . ambutation toes    . AMPUTATION Left 09/10/2018   Procedure: AMPUTATION DIGIT LEFT 3RD TOE;  Surgeon: Erskine Emery, DPM;  Location: [facility];  Service: Podiatry;  Laterality: Left;  . CHOLECYSTECTOMY    . LEG AMPUTATION BELOW KNEE  03/2017      Social History:     Social History   Tobacco Use  . Smoking status: Never Smoker  . Smokeless tobacco: Never Used  Substance Use Topics  . Alcohol use: No     Lives -home with wife  Mobility -has a right leg prosthesis     Family History :   No family history of heart disease or diabetes  Home Medications:   Prior to Admission medications   Medication Sig Start Date End Date Taking? Authorizing Provider  aspirin EC 81 MG tablet Take 81 mg by mouth daily.   Yes [provider]  carboxymethylcellulose (REFRESH PLUS) 0.5 % SOLN Apply 1 drop to eye 3 (three) times daily as needed (for chronic  dry eye).    Yes [provider]  Cholecalciferol 2000 units CAPS Take 2,000 Units by mouth daily.   Yes [provider]  erythromycin ophthalmic ointment Place 1 application into both eyes at bedtime. Apply a thin layer into both eyes 01/26/19  Yes [provider]  Flaxseed, Linseed, (FLAX SEED OIL PO) Take 1,200 mg by mouth daily.   Yes [provider]  HUMALOG 100 UNIT/ML injection 80-85 Units by Pump Prime route See admin instructions. Approximately 80-85 units/24hrs via insulin pump 07/11/15  Yes [provider]  leflunomide (ARAVA) 10 MG tablet Take 10 mg by mouth daily.   Yes [provider]  methotrexate (RHEUMATREX) 2.5 MG tablet Take 10 mg by mouth every Sunday.  07/06/15  Yes [provider]  Multiple Vitamin (MULTIVITAMIN WITH MINERALS) TABS tablet Take 1 tablet by mouth every morning.   Yes [provider]  omeprazole (PRILOSEC) 10 MG capsule Take 10 mg by mouth every morning.    Yes [provider]  OVER THE COUNTER MEDICATION Take 2,688 mcg by mouth daily. DE Dry Eye Omega 2688 mcg Supplement   Yes [provider]  RESTASIS 0.05 % ophthalmic emulsion Place 1 drop into both eyes 2 (two) times daily.  01/26/19  Yes [provider]  vitamin E 1000 UNIT capsule Take 1,000 Units by mouth daily.   Yes [provider]     Allergies:    No Known Allergies   Physical Exam:   Vitals  Blood pressure (!) 122/109, temperature 98.8 F (37.1 C), temperature source Oral, resp. rate 18, height 5\' 7"  (1.702 m), weight 92.1 kg, SpO2 95 %.  General: Elderly male lying in bed in no acute distress HEENT: Pupils reactive bilateral, EOMI, no pallor, no chills, moist mucosa, supple neck Chest: Clear bilaterally CVs: Normal S1-S2, no murmurs rub or gallop GI: Soft, nontender, nontender, bowel sounds present Musculoskeletal: Right BKA with prosthesis Ulcer over the medial aspect of the left foot  measuring 2 x 2 centimeter with exposed tendon, no discharge, minimal warmth and no tenderness, distal pulses feeble, status post left third toe amputation CNS: Alert and oriented, nonfocal   Data Review:    CBC Recent Labs  Lab 02/05/19 1613  WBC 5.7  HGB 12.9*  HCT 39.9  PLT 146*  MCV 91.1  MCH 29.5  MCHC 32.3  RDW 15.4  LYMPHSABS 0.5*  MONOABS 0.6  EOSABS 0.1  BASOSABS 0.0   ------------------------------------------------------------------------------------------------------------------  Chemistries  Recent Labs  Lab 02/05/19 1613  NA 132*  K 4.7  CL 99  CO2 25  GLUCOSE 242*  BUN 22  CREATININE 1.36*  CALCIUM 8.8*   ------------------------------------------------------------------------------------------------------------------ estimated creatinine clearance is 51.6 mL/min (A) (by C-G formula based on SCr of 1.36 mg/dL (H)). ------------------------------------------------------------------------------------------------------------------ No results for input(s): TSH, T4TOTAL, T3FREE, THYROIDAB in the last 72 hours.  Invalid input(s): FREET3  Coagulation profile No results for input(s): INR, PROTIME in the last 168 hours. ------------------------------------------------------------------------------------------------------------------- No results for input(s): DDIMER in the last 72 hours. -------------------------------------------------------------------------------------------------------------------  Cardiac Enzymes No results for input(s): CKMB, TROPONINI, MYOGLOBIN in the last 168 hours.  Invalid input(s): CK ------------------------------------------------------------------------------------------------------------------ No results found for: BNP   ---------------------------------------------------------------------------------------------------------------  Urinalysis    Component Value Date/Time   COLORURINE YELLOW 12/14/2016 1330    APPEARANCEUR CLOUDY (A) 12/14/2016 1330   LABSPEC >1.030 (H) 12/14/2016 1330   PHURINE 5.5 12/14/2016 1330   GLUCOSEU 250 (A) 12/14/2016 1330   HGBUR NEGATIVE 12/14/2016 1330   BILIRUBINUR NEGATIVE 12/14/2016 1330   KETONESUR NEGATIVE 12/14/2016 1330   PROTEINUR NEGATIVE 12/14/2016 1330   NITRITE NEGATIVE 12/14/2016 1330   LEUKOCYTESUR NEGATIVE 12/14/2016 1330    ----------------------------------------------------------------------------------------------------------------   Imaging Results:    Dg Chest 2 View  Result Date: 02/05/2019 CLINICAL DATA:  73 year old male with fever for 2 days. Left foot wound. Diabetes. EXAM: CHEST - 2 VIEW COMPARISON:  Chest radiographs 07/31/2015. FINDINGS: Chronic staple line in the right upper lung. Stable lung volumes. Mediastinal contours remain within normal limits. Visualized tracheal air column is within normal limits. No pneumothorax, pulmonary edema, pleural effusion or acute pulmonary opacity. Chronic right lateral pleural thickening is stable. No acute osseous abnormality identified. Stable cholecystectomy clips. Negative visible bowel gas pattern. IMPRESSION: No acute cardiopulmonary abnormality. Electronically Signed   By: [name] M.D.   On: 02/05/2019 17:08   Dg Foot Complete Left  Addendum Date: 02/05/2019   ADDENDUM REPORT: 02/05/2019 17:20 ADDENDUM: Study discussed by telephone with Dr. Geoffery Lyons on 02/05/2019 at 1714 hours. Electronically Signed   By: [name] M.D.   On: 02/05/2019 17:20   Result Date: 02/05/2019 CLINICAL DATA:  73 year old male with fever for 2 days. Left foot wound. Diabetes. EXAM: LEFT FOOT - COMPLETE 3+ VIEW COMPARISON:  Left foot series 01/06/2019. FINDINGS: Multifocal previous left foot amputations. Sclerotic neuropathic changes to the residual proximal metatarsals. Chronic soft tissue wound overlying the medial head of the 1st metatarsal with interval bony fragmentation and subtle cortical osteolysis. Similar subtle  changes to the medial base of the 1st proximal phalanx since February. Calcified peripheral vascular disease. Small volume of soft tissue gas tracking in the dorsum of the foot. IMPRESSION: 1. Positive acute osteomyelitis at the medial head of the 1st metatarsal and base of the 1st proximal phalanx. 2. Overlying chronic soft tissue wound. New soft tissue gas at the dorsum of the foot. Electronically Signed: By: [name] M.D. On: 02/05/2019 17:11    My personal review of EKG: Pending  Assessment & Plan:    Principal Problem:   Acute osteomyelitis of left foot (HCC) Admit to MedSurg.  Has associated symptoms of fever and chills.  Placed on empiric IV vancomycin.  Will order blood cultures. Discussed with podiatrist Dr. Nolen Mu  who has informed his partner Dr. Erskine EmeryPrayashkumar Patel who will see the patient tomorrow afternoon.  Please keep patient n.p.o. after breakfast tomorrow in case he is taken for surgery later in the afternoon. Tylenol PRN for pain.  Active Problems:   Diabetic foot ulcer (HCC) Wound care consult.    Obstructive sleep apnea on CPAP Nighttime CPAP  Uncontrolled diabetes mellitus, insulin-dependent with diabetes neuropathy On insulin pump which will be resumed.  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  Monitor.   DVT Prophylaxis: Subcu Lovenox  AM Labs Ordered, also please review Full Orders  Family Communication: Admission, patients condition and plan of care including tests being ordered have been discussed with the patient and his wife at bedside   Code Status full code  Likely DC to pending hospital course  Condition: Fair  Consults called: Podiatry (Dr. [name] will see tomorrow)  Admission status: Inpatient Patient presenting with acute osteomyelitis with symptoms of fevers and chills requiring inpatient IV antibiotics and possible surgical intervention.  Patient needs to be monitored on IV  antibiotics and possibly postoperatively for >2 midnight.  Time spent in minutes : 55   Raeonna Milo M.D on 02/05/2019 at 6:28 PM  Between 7am to 7pm - Pager - [phone]. After 7pm go to [email or web address] - password [email or web address]  [facility] - Office  [phone]

## 2019-02-05 NOTE — ED Provider Notes (Signed)
[facility] EMERGENCY DEPARTMENT Provider Note   CSN: [id number] Arrival date & time: 02/05/19  1524    History   Chief Complaint Chief Complaint  Patient presents with  . Fever    HPI Andrew Schultz is a 73 y.o. male.     Patient is a 73 year old male with past medical history of diabetes, peripheral neuropathy, chronic renal insufficiency, and chronic left foot ulcer.  He presents today with complaints of fever.  The patient does report recent URI symptoms, however believes that these were improving.  He does report some warmth to the ulcer to the medial aspect of his right foot.  He is currently attending wound care at [facility] long.  He was instructed that should he develop fever, he should come have his foot evaluated.  The history is provided by the patient.  Fever  Max temp prior to arrival:  99 Temp source:  Oral Severity:  Mild Progression:  Unchanged Chronicity:  New Relieved by:  Nothing Worsened by:  Nothing Ineffective treatments:  None tried   Past Medical History:  Diagnosis Date  . Apnea, sleep   . CKD (chronic kidney disease)   . Diabetes mellitus without complication (HCC)   . GERD (gastroesophageal reflux disease)   . Hyperkalemia   . Neuropathy   . Obstructive sleep apnea on CPAP   . RA (rheumatoid arthritis) (HCC)   . Renal disorder     Patient Active Problem List   Diagnosis Date Noted  . Diabetic foot infection (HCC) 03/14/2017  . Diabetic foot ulcer (HCC) 02/17/2017  . RA (rheumatoid arthritis) (HCC)   . Obstructive sleep apnea on CPAP   . Neuropathy     Past Surgical History:  Procedure Laterality Date  . ambutation toes    . AMPUTATION Left 09/10/2018   Procedure: AMPUTATION DIGIT LEFT 3RD TOE;  Surgeon: Erskine Emery, DPM;  Location: [facility];  Service: Podiatry;  Laterality: Left;  . CHOLECYSTECTOMY    . 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 Authorizing Provider  Artificial Tear Solution (SOOTHE XP OP) Place 1 drop into both eyes 4 (four) times daily as needed (for dry eyes).    [provider]  aspirin EC 81 MG tablet Take 81 mg by mouth daily.    [provider]  Cholecalciferol 2000 units CAPS Take 2,000 Units by mouth daily.    [provider]  Flaxseed, Linseed, (FLAX SEED OIL PO) Take 1,200 mg by mouth daily.    [provider]  HUMALOG 100 UNIT/ML injection 80-85 Units by Pump Prime route See admin instructions. Approximately 80-85 units/24hrs via insulin pump 07/11/15   [provider]  HYDROmorphone (DILAUDID) 4 MG tablet Take 1 tablet (4 mg total) by mouth every 6 (six) hours as needed for severe pain. Patient not taking: Reported on 09/01/2018 11/26/17   Donnetta Hutching, MD  leflunomide (ARAVA) 10 MG tablet Take 10 mg by mouth daily.    [provider]  methotrexate (RHEUMATREX) 2.5 MG tablet Take 10 mg by mouth every Sunday.  07/06/15   [provider]  Multiple Vitamin (MULTIVITAMIN WITH MINERALS) TABS tablet Take 1 tablet by mouth every morning.    [provider]  omeprazole (PRILOSEC) 10 MG capsule Take 10 mg by mouth daily.    [provider]  OVER THE COUNTER MEDICATION Take 2,688  mcg by mouth daily. DE Dry Eye Omega 2688 mcg Supplement    [provider]  vitamin E 1000 UNIT capsule Take 1,000 Units by mouth daily.    [provider]    Family History No family history on file.  Social History Social History   Tobacco Use  . Smoking status: Never Smoker  . Smokeless tobacco: Never Used  Substance Use Topics  . Alcohol use: No  . Drug use: No     Allergies   Patient has no known allergies.   Review of Systems Review of Systems  Constitutional: Positive for fever.  All other systems reviewed and are negative.    Physical Exam Updated Vital Signs BP (!) 122/109 (BP Location:  Right Arm)   Temp 98.8 F (37.1 C) (Oral)   Resp 18   Ht 5\' 7"  (1.702 m)   Wt 92.1 kg   SpO2 95%   BMI 31.79 kg/m   Physical Exam Vitals signs and nursing note reviewed.  Constitutional:      General: He is not in acute distress.    Appearance: He is well-developed. He is not diaphoretic.  HENT:     Head: Normocephalic and atraumatic.  Neck:     Musculoskeletal: Normal range of motion and neck supple.  Cardiovascular:     Rate and Rhythm: Normal rate and regular rhythm.     Heart sounds: No murmur. No friction rub.  Pulmonary:     Effort: Pulmonary effort is normal. No respiratory distress.     Breath sounds: Normal breath sounds. No wheezing or rales.  Abdominal:     General: Bowel sounds are normal. There is no distension.     Palpations: Abdomen is soft.     Tenderness: There is no abdominal tenderness.  Musculoskeletal: Normal range of motion.     Comments: The medial aspect of the left foot has a 2 cm round ulcer.  There is some surrounding erythema and warmth, however no purulent discharge or drainage.  There is no erythema extending proximally and no red streaks.  Skin:    General: Skin is warm and dry.  Neurological:     Mental Status: He is alert and oriented to person, place, and time.     Coordination: Coordination normal.      ED Treatments / Results  Labs (all labs ordered are listed, but only abnormal results are displayed) Labs Reviewed  BASIC METABOLIC PANEL  CBC WITH DIFFERENTIAL/PLATELET  URINALYSIS, ROUTINE W REFLEX MICROSCOPIC    EKG None  Radiology No results found.  Procedures Procedures (including critical care time)  Medications Ordered in ED Medications - No data to display   Initial Impression / Assessment and Plan / ED Course  I have reviewed the triage vital signs and the nursing notes.  Pertinent labs & imaging results that were available during my care of the patient were reviewed by me and considered in my medical  decision making (see chart for details).  Patient presents with complaints of fever and increased pain, warmth, and redness to his left foot.  He has a history of recurrent osteomyelitis.  His x-rays today do show what appears to be osteomyelitis.  His laboratory studies are essentially unremarkable and chest x-ray is clear.  Patient was given vancomycin here in the ER and will be admitted to the hospitalist service.  Dr. Gonzella Lexhungel agrees to admit.  Final Clinical Impressions(s) / ED Diagnoses   Final diagnoses:  None    ED Discharge  Orders    None       Geoffery Lyonselo, Imogen Maddalena, MD 02/05/19 2008

## 2019-02-05 NOTE — Progress Notes (Signed)
Pharmacy Antibiotic Note  Andrew Schultz is a 73 y.o. male admitted on 02/05/2019 with osteomyelitis.  Pharmacy has been consulted for vancomycin dosing.  Plan: Vancomycin 750mg  IV every 12 hours.  Goal trough 15-20 mcg/mL.  Height: 5\' 7"  (170.2 cm) Weight: 203 lb (92.1 kg) IBW/kg (Calculated) : 66.1  Temp (24hrs), Avg:98.7 F (37.1 C), Min:98.6 F (37 C), Max:98.8 F (37.1 C)  Recent Labs  Lab 02/05/19 1613  WBC 5.7  CREATININE 1.36*    Estimated Creatinine Clearance: 51.6 mL/min (A) (by C-G formula based on SCr of 1.36 mg/dL (H)).    No Known Allergies  Antimicrobials this admission: 3/12 vancomycin >>   Microbiology results: None   Thank you for allowing pharmacy to be a part of this patient's care.  Gerre Pebbles Manami Tutor 02/05/2019 8:46 PM

## 2019-02-05 NOTE — Progress Notes (Signed)
Patient refused CPAP, no unit in room at this time. 

## 2019-02-06 ENCOUNTER — Inpatient Hospital Stay (HOSPITAL_COMMUNITY): Payer: Medicare Other

## 2019-02-06 ENCOUNTER — Encounter (HOSPITAL_COMMUNITY): Payer: Self-pay | Admitting: Family Medicine

## 2019-02-06 DIAGNOSIS — M069 Rheumatoid arthritis, unspecified: Secondary | ICD-10-CM

## 2019-02-06 LAB — BASIC METABOLIC PANEL
Anion gap: 9 (ref 5–15)
BUN: 18 mg/dL (ref 8–23)
CHLORIDE: 100 mmol/L (ref 98–111)
CO2: 28 mmol/L (ref 22–32)
Calcium: 8.7 mg/dL — ABNORMAL LOW (ref 8.9–10.3)
Creatinine, Ser: 1.29 mg/dL — ABNORMAL HIGH (ref 0.61–1.24)
GFR calc Af Amer: >60 mL/min (ref >60)
GFR calc non Af Amer: 54 mL/min — ABNORMAL LOW (ref >60)
Glucose, Bld: 78 mg/dL (ref 70–99)
Potassium: 4.4 mmol/L (ref 3.5–5.1)
Sodium: 137 mmol/L (ref 135–145)

## 2019-02-06 LAB — GLUCOSE, CAPILLARY
GLUCOSE-CAPILLARY: 190 mg/dL — AB (ref 70–99)
Glucose-Capillary: 58 mg/dL — ABNORMAL LOW (ref 70–99)
Glucose-Capillary: 147 mg/dL — ABNORMAL HIGH (ref 70–99)
Glucose-Capillary: 263 mg/dL — ABNORMAL HIGH (ref 70–99)
Glucose-Capillary: 78 mg/dL (ref 70–99)

## 2019-02-06 LAB — CBC
HEMATOCRIT: 39.4 % (ref 39.0–52.0)
Hemoglobin: 12.7 g/dL — ABNORMAL LOW (ref 13.0–17.0)
MCH: 29.5 pg (ref 26.0–34.0)
MCHC: 32.2 g/dL (ref 30.0–36.0)
MCV: 91.4 fL (ref 80.0–100.0)
Platelets: 149 10*3/uL — ABNORMAL LOW (ref 150–400)
RBC: 4.31 MIL/uL (ref 4.22–5.81)
RDW: 15.3 % (ref 11.5–15.5)
WBC: 5.2 10*3/uL (ref 4.0–10.5)
nRBC: 0 % (ref 0.0–0.2)

## 2019-02-06 MED ORDER — INSULIN PUMP
Freq: Three times a day (TID) | SUBCUTANEOUS | Status: DC
  Administered 2019-02-06: 8 via SUBCUTANEOUS
  Administered 2019-02-06: 7.8 via SUBCUTANEOUS
  Administered 2019-02-07 (×3): via SUBCUTANEOUS

## 2019-02-06 MED ORDER — SODIUM CHLORIDE 0.9 % IV SOLN
2.0000 g | Freq: Three times a day (TID) | INTRAVENOUS | Status: DC
  Administered 2019-02-06 – 2019-02-07 (×3): 2 g via INTRAVENOUS
  Filled 2019-02-06 (×3): qty 2

## 2019-02-06 MED ORDER — SODIUM CHLORIDE 0.9 % IV SOLN: 2.0000 g | Freq: Once | INTRAVENOUS | Status: DC

## 2019-02-06 MED ORDER — VITAMIN E 180 MG (400 UNIT) PO CAPS
800.0000 [IU] | ORAL_CAPSULE | Freq: Every day | ORAL | Status: DC
  Administered 2019-02-07: 800 [IU] via ORAL
  Filled 2019-02-06: qty 2

## 2019-02-06 MED ORDER — DEXTROSE 50 % IV SOLN
INTRAVENOUS | Status: AC
  Administered 2019-02-06: 50 mL
  Filled 2019-02-06: qty 50

## 2019-02-06 MED ORDER — METRONIDAZOLE IN NACL 5-0.79 MG/ML-% IV SOLN
500.0000 mg | Freq: Three times a day (TID) | INTRAVENOUS | Status: DC
  Administered 2019-02-06 – 2019-02-07 (×3): 500 mg via INTRAVENOUS
  Filled 2019-02-06 (×3): qty 100

## 2019-02-06 NOTE — Progress Notes (Signed)
PROGRESS NOTE  Andrew Schultz  ZOX:[name]  DOB: [birth year]  DOA: 02/05/2019 PCP: [name], [name], MD   Brief Admission Hx: 73 y.o. male, with history of diabetes mellitus on insulin pump, peripheral neuropathy, rheumatoid arthritis, chronic kidney disease stage?  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  MDM/Assessment & Plan:   1. Acute osteomyelitis of left foot- patient has findings on x-rays that are concerning for osteomyelitis.  The patient will be seen by podiatric surgery later today for operative management considerations.  The patient is n.p.o. after breakfast this morning.  He has Tylenol ordered for pain as needed. 2. OSA-continue nightly CPAP as ordered. 3. 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  If needed, would discontinue insulin pump and place him on basal bolus supplemental insulin while in the hospital.  He could be placed on an insulin infusion postoperatively and subsequently transitioned to subcutaneous insulin when he is able to eat again.  This will depend on if he needs surgical management.  Await surgery recommendations. 4. Stage III CKD-following renal function closely. 5. Rheumatoid arthritis-he has been resumed on his home methotrexate. 6. GERD-he is on Protonix 40 mg daily.  DVT prophylaxis: Lovenox Code Status: Full Family Communication: Patient fully updated at bedside Disposition Plan: Pending further inpatient management   Consultants:  Podiatry surgery  Procedures:  N/A  Antimicrobials:  Vancomycin 3/12  Subjective: Patient reports that he does not have much appetite but he would eat some breakfast today.  He has no other specific complaints at this time.  Objective:  Vitals:   02/05/19 2000 02/05/19 2026 02/05/19 2104 02/06/19 0536  BP: 111/62 118/62  133/78  Pulse: 74 80  84  Resp:  20  16  Temp:  98.6 F (37 C)  97.7 F (36.5 C)  TempSrc:  Oral  Oral  SpO2:  94% 92% 94%  Weight:      Height:        Intake/Output Summary (Last 24 hours) at 02/06/2019 1215 Last data filed at 02/06/2019 0900 Gross per 24 hour  Intake 436.76 ml  Output 450 ml  Net -13.24 ml   Filed Weights   02/05/19 1534  Weight: 92.1 kg   REVIEW OF SYSTEMS  As per history otherwise all reviewed and reported negative  Exam:  General exam: Awake, alert, no apparent distress, cooperative  respiratory system: Clear. No increased work of breathing. Cardiovascular system: Normal S1 & S2 heard. No JVD, murmurs, gallops, clicks or pedal edema. Gastrointestinal system: Abdomen is nondistended, soft and nontender. Normal bowel sounds heard. Central nervous system: Alert and oriented. No focal neurological deficits. Extremities: Left foot wound bandaged well no drainage seen and no foul order.  Data Reviewed: Basic Metabolic Panel: Recent Labs  Lab 02/05/19 1613 02/06/19 0424  NA 132* 137  K 4.7 4.4  CL 99 100  CO2 25 28  GLUCOSE 242* 78  BUN 22 18  CREATININE 1.36* 1.29*  CALCIUM 8.8* 8.7*   Liver Function Tests: No results for input(s): AST, ALT, ALKPHOS, BILITOT, PROT, ALBUMIN in the last 168 hours. No results for input(s): LIPASE, AMYLASE in the last 168 hours. No results for input(s): AMMONIA in the last 168 hours. CBC: Recent Labs  Lab 02/05/19 1613  02/06/19 0424  WBC 5.7 5.2  NEUTROABS 4.4  --   HGB 12.9* 12.7*  HCT 39.9 39.4  MCV 91.1 91.4  PLT 146* 149*   Cardiac Enzymes: No results for input(s): CKTOTAL, CKMB, CKMBINDEX, TROPONINI in the last 168 hours. CBG (last 3)  Recent Labs    02/06/19 0725 02/06/19 0807 02/06/19 1113  GLUCAP 58* 147* 263*   No results found for this or any previous visit (from the past 240 hour(s)).   Studies:  Dg Chest 2 View  Result Date: 02/05/2019 CLINICAL DATA:  73 year old male with fever for 2 days. Left foot wound. Diabetes. EXAM: CHEST - 2 VIEW COMPARISON:  Chest radiographs 07/31/2015. FINDINGS: Chronic staple line in the right upper lung. Stable lung volumes. Mediastinal contours remain within normal limits. Visualized tracheal air column is within normal limits. No pneumothorax, pulmonary edema, pleural effusion or acute pulmonary opacity. Chronic right lateral pleural thickening is stable. No acute osseous abnormality identified. Stable cholecystectomy clips. Negative visible bowel gas pattern. IMPRESSION: No acute cardiopulmonary abnormality. Electronically Signed   By: [name] M.D.   On: 02/05/2019 17:08   Dg Foot Complete Left  Addendum Date: 02/05/2019   ADDENDUM REPORT: 02/05/2019 17:20 ADDENDUM: Study discussed by telephone with Dr. Geoffery Lyons on 02/05/2019 at 1714 hours. Electronically Signed   By: [name] M.D.   On: 02/05/2019 17:20   Result Date: 02/05/2019 CLINICAL DATA:  73 year old male with fever for 2 days. Left foot wound. Diabetes. EXAM: LEFT FOOT - COMPLETE 3+ VIEW COMPARISON:  Left foot series 01/06/2019. FINDINGS: Multifocal previous left foot amputations. Sclerotic neuropathic changes to the residual proximal metatarsals. Chronic soft tissue wound overlying the medial head of the 1st metatarsal with interval bony fragmentation and subtle cortical osteolysis. Similar subtle changes to the medial base of the 1st proximal phalanx since February. Calcified peripheral vascular disease. Small volume of soft tissue gas tracking in the dorsum of the foot. IMPRESSION: 1. Positive acute osteomyelitis at the medial head of the 1st metatarsal and base of the 1st proximal phalanx. 2. Overlying chronic soft tissue wound. New soft tissue gas at the dorsum of the foot. Electronically Signed: By: [name] M.D. On: 02/05/2019 17:11   Scheduled Meds: . aspirin EC  81 mg Oral Daily  .  cholecalciferol  2,000 Units Oral Daily  . enoxaparin (LOVENOX) injection  40 mg Subcutaneous Q24H  . insulin pump   Subcutaneous TID AC & HS  . leflunomide  10 mg Oral Daily  . [START ON 02/08/2019] methotrexate  10 mg Oral Q Sun  . multivitamin with minerals  1 tablet Oral q morning - 10a  . pantoprazole  40 mg Oral Daily  . vitamin E  800 Units Oral Daily   Continuous Infusions: . vancomycin 750 mg (02/06/19 1030)    Principal Problem:   Acute osteomyelitis of left foot (HCC) Active Problems:   Diabetic foot ulcer (HCC)   RA (rheumatoid arthritis) (HCC)   Obstructive sleep apnea on CPAP   Diabetic neuropathy (HCC)  Time spent:   Standley Dakins, MD [facility] 02/06/2019, 12:15 PM    LOS: 1 day  How to contact the [facility] Attending or Consulting provider 7A - 7P or covering provider during after hours 7P -7A, for this patient?  1. Check the care team in [facility] and look for a) attending/consulting [facility] provider listed and b) the [facility] team listed 2. Log into [email or web address] and use [place]'s universal password to access. If you do  not have the password, please contact the hospital operator. 3. Locate the [facility] provider you are looking for under [facility] and page to a number that you can be directly reached. 4. If you still have difficulty reaching the provider, please page the [facility] (Director on Call) for the Hospitalists listed on amion for assistance.

## 2019-02-06 NOTE — Progress Notes (Signed)
Pharmacy Antibiotic Note  Andrew Schultz is a 73 y.o. male admitted on 02/05/2019 with diabetic foot infection/osteomyelitis.  Pharmacy has been consulted for Cefepime dosing. MRI shows osteomyelitis Plan: Continue Vancomycin 750mg  IV every 12 hours.  Goal trough 15-20 mcg/mL. Cefepime 2gm IV q8h Also on Flagyl 500mg  IV q8h F/U cxs and clinical progress Monitor V/S, labs and levels as indicated  Height: 5\' 7"  (170.2 cm) Weight: 203 lb (92.1 kg) IBW/kg (Calculated) : 66.1  Temp (24hrs), Avg:98.2 F (36.8 C), Min:97.7 F (36.5 C), Max:98.6 F (37 C)  Recent Labs  Lab 02/05/19 1613 02/06/19 0424  WBC 5.7 5.2  CREATININE 1.36* 1.29*    Estimated Creatinine Clearance: 54.4 mL/min (A) (by C-G formula based on SCr of 1.29 mg/dL (H)).    No Known Allergies  Antimicrobials this admission: vancomycin 3/12 >>  Cefepime 3/13 >>  Flagyl 3/13>>  Dose adjustments this admission: n/a  Microbiology results: none Thank you for allowing pharmacy to be a part of this patient's care.  Andrew Schultz, BS Andrew Schultz, [place] Clinical Pharmacist Pager [phone] 02/06/2019 8:15 PM

## 2019-02-06 NOTE — TOC Initial Note (Signed)
Transition of Care [facility]) - Initial/Assessment Note    Patient Details  Name: Andrew Schultz MRN: [id number] Date of Birth: 05-28-1946  Transition of Care [facility]) CM/SW Contact:    Andrew Schultz, Andrew Oiler, RN Phone Number: 02/06/2019, 1:35 PM  Clinical Narrative:  Acute osteomyelitis. From home with wife. Right BKA, has prosthesis. Has insulin pump. Patient has been seen by PT, recommendation for OP PT, but subject to change depending on hospital course. Podiatry consult pending. TOC team following for needs.     Expected Discharge Plan: Home w Home Health Services Barriers to Discharge: No Barriers Identified      Expected Discharge Plan and Services Expected Discharge Plan: Home w Home Health Services Discharge Planning Services: CM Consult     Expected Discharge Date: 02/06/19                            Need for Family Participation in Patient Care: Yes (Comment) Care giver support system in place?: Yes (comment) Current home services: DME    Activities of Daily Living Home Assistive Devices/Equipment: CBG Meter, Walker (specify type), Cane (specify quad or straight), Insulin Pump, Hearing aid ADL Screening (condition at time of admission) Patient's cognitive ability adequate to safely complete daily activities?: Yes Is the patient deaf or have difficulty hearing?: No Does the patient have difficulty seeing, even when wearing glasses/contacts?: No Does the patient have difficulty concentrating, remembering, or making decisions?: No Patient able to express need for assistance with ADLs?: Yes Does the patient have difficulty dressing or bathing?: No Independently performs ADLs?: Yes (appropriate for developmental age) Does the patient have difficulty walking or climbing stairs?: No Weakness of Legs: Left Weakness of Arms/Hands: None                    Admission diagnosis:  Osteomyelitis of left foot, unspecified type [name]) [M86.9] Patient Active Problem  List   Diagnosis Date Noted  . Acute osteomyelitis of left foot (HCC) 02/05/2019  . Diabetic neuropathy (HCC) 02/05/2019  . Diabetic foot infection (HCC) 03/14/2017  . Diabetic foot ulcer (HCC) 02/17/2017  . RA (rheumatoid arthritis) (HCC)   . Obstructive sleep apnea on CPAP   . Neuropathy    PCP:  Andrew Stabile, MD Pharmacy:   [facility] [phone] - [place], [place] - [address] [place] [address] Phone: [phone] Fax: [phone]    Readmission Risk Interventions 30 Day Unplanned Readmission Risk Score     ED to Hosp-Admission (Current) from 02/05/2019 in [facility] SURGICAL UNIT  30 Day Unplanned Readmission Risk Score (%)  14 Filed at 02/06/2019 1200     This score is the patient's risk of an unplanned readmission within 30 days of being discharged (0 -100%). The score is based on dignosis, age, lab data, medications, orders, and past utilization.   Low:  0-14.9   Medium: 15-21.9   High: 22-29.9   Extreme: 30 and above       No flowsheet data found.

## 2019-02-06 NOTE — Progress Notes (Signed)
Inpatient Diabetes Program Recommendations  AACE/ADA: New Consensus Statement on Inpatient Glycemic Control (2015)  Target Ranges:  Prepandial:   less than 140 mg/dL      Peak postprandial:   less than 180 mg/dL (1-2 hours)      Critically ill patients:  140 - 180 mg/dL   Lab Results  Component Value Date   GLUCAP 147 (H) 02/06/2019   HGBA1C 7.6 (H) 09/08/2018    Review of Glycemic Control  Diabetes history: Type 1 Outpatient Diabetes medications: Medtronic insulin pump  Current orders for Inpatient glycemic control: insulin pump order set  Inpatient Diabetes Program Recommendations:    Spoke with patient on the phone about his diabetes and insulin pump. States that he was diagnosed with Type 1 diabetes  48 years ago and has had an insulin pump for 22 years. Has had an updated Medtronic pump (didn't know which one) for about 1 year. Sees Dr. Dwana Melena as his PCP. States that his basal rates are approximately 2 units/hr. With 4-5 different settings. States that his CHO coverage is 5 units/15 grams CHO. He likes to keep blood sugars 80-180 mg/dl. Latest HgbA1C was 8.3% about 3 weeks ago.   States that he had not been feeling well the last few weeks, touch of flu. He had not eaten much yesterday due to appetite not good. States he is feeling better this am and ate a good breakfast. Very aware of what he eats, tries to eat healthy. He did change his site for the pump yesterday morning and changes site every 3 days. Seems very knowledgeable of what he needs to do to control his diabetes. Will continue to monitor blood sugars while in the hospital.  Smith Mince RN BSN CDE Diabetes Coordinator Pager: [phone]  8am-5pm

## 2019-02-06 NOTE — Evaluation (Addendum)
Physical Therapy Evaluation Patient Details Name: Andrew Schultz MRN: [id number] DOB: Jul 18, 1946 Today's Date: 02/06/2019   History of Present Illness  Patient is a 73 year old male admitted 02/05/2019 with diagnosis of acute osteomyelitis of the left foot. 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      Clinical Impression  Patient evaluated by Physical Therapy with no further acute PT needs identified. All education has been completed and the patient has no further questions. Patient sitting at EOB donning right compression socks in order to put on his Rt BKA prosthesis. RW in room. Patient agreeable to participating in evaluation. Wife present throughout session. Patient able to transfer modified independent and stand near objects to complete donning of Rt BKA. During ambulation with SPC and supervision, patient had tendency to converse with hands gesturing which at times would perturb his balance enough to veer his walking path and require use of his SPC to regain mild LOBs.  SPC too high for efficiency. PT adjusted height for safety and ergonomics. Patient reports he did not really receive PT after receipt of his prosthesis 2 years ago and he would like some help for improving his balance, function and safety. See below for any follow-up Physical Therapy or equipment needs. PT is signing off. Thank you for this referral. Patient would benefit from ambulation with nursing while in current venue.    Follow Up Recommendations Outpatient PT;Supervision - Intermittent    Equipment Recommendations  None recommended by PT    Recommendations for Other Services       Precautions / Restrictions        Mobility  Bed Mobility Overal bed mobility: Modified Independent             General bed mobility comments:  increased time  Transfers Overall transfer level: Modified independent Equipment used: Straight cane Transfers: Sit to/from Stand Sit to Stand: Modified independent (Device/Increase time) Stand pivot transfers: Supervision       General transfer comment: Rt BKA  Ambulation/Gait Ambulation/Gait assistance: Supervision Gait Distance (Feet): 350 Feet Assistive device: Straight cane Gait Pattern/deviations: Step-through pattern;Decreased weight shift to right;Decreased step length - right;Decreased step length - left;Decreased stride length Gait velocity: somewhat decreased   General Gait Details: Rt BKA, SPC too high, PT reduced height ergonomically, cues on placement of SPC, improved steadiness during ambulation, mild LOBs with hand gesturing during conversation while ambulating  Stairs            Wheelchair Mobility    Modified Rankin (Stroke Patients Only)       Balance Overall balance assessment: Mild deficits observed, not formally tested                                           Pertinent Vitals/Pain Pain Assessment: No/denies pain    Home Living Family/patient expects to be discharged to:: Private residence Living Arrangements: Spouse/significant other Available Help at Discharge: Available 24 hours/day;Family Type of Home: House Home Access: Stairs to enter Entrance Stairs-Rails: Chemical engineer of Steps: 7 Home Layout: Two level Home Equipment: Shower seat;Cane - single point;Wheelchair - manual;Hand held shower head;Grab bars - tub/shower;Grab bars - toilet Additional Comments: uses RW to use bathroom at night    Prior Function  Level of Independence: Independent with assistive device(s)               Hand Dominance   Dominant Hand: Right    Extremity/Trunk Assessment   Upper Extremity Assessment Upper Extremity Assessment: Overall WFL for tasks assessed    Lower Extremity Assessment Lower Extremity  Assessment: RLE deficits/detail;LLE deficits/detail RLE Deficits / Details: Rt BKA LLE Deficits / Details: Lt 3rd toe amp, Lt med foot ulcer LLE Sensation: history of peripheral neuropathy    Cervical / Trunk Assessment Cervical / Trunk Assessment: Normal  Communication   Communication: No difficulties  Cognition Arousal/Alertness: Awake/alert Behavior During Therapy: WFL for tasks assessed/performed Overall Cognitive Status: Within Functional Limits for tasks assessed                                        General Comments      Exercises     Assessment/Plan    PT Assessment All further PT needs can be met in the next venue of care  PT Problem List Decreased safety awareness;Decreased balance;Decreased knowledge of use of DME       PT Treatment Interventions      PT Goals (Current goals can be found in the Care Plan section)  Acute Rehab PT Goals Patient Stated Goal: return home. PT Goal Formulation: With patient/family Time For Goal Achievement: 02/20/19 Potential to Achieve Goals: Good    Frequency     Barriers to discharge        Co-evaluation               AM-PAC PT "6 Clicks" Mobility  Outcome Measure Help needed turning from your back to your side while in a flat bed without using bedrails?: None Help needed moving from lying on your back to sitting on the side of a flat bed without using bedrails?: None Help needed moving to and from a bed to a chair (including a wheelchair)?: A Little Help needed standing up from a chair using your arms (e.g., wheelchair or bedside chair)?: None Help needed to walk in hospital room?: A Little Help needed climbing 3-5 steps with a railing? : A Little 6 Click Score: 21    End of Session Equipment Utilized During Treatment: Gait belt Activity Tolerance: Patient tolerated treatment well Patient left: in chair;with family/visitor present Nurse Communication: Mobility status PT Visit Diagnosis:  Unsteadiness on feet (R26.81);Other abnormalities of gait and mobility (R26.89)    Time: 1388-7195 PT Time Calculation (min) (ACUTE ONLY): 30 min   Charges:   PT Evaluation $PT Eval Moderate Complexity: 1 Mod PT Treatments $Gait Training: 8-22 mins        Floria Raveling. Hartnett-Rands, MS, PT Per [place] [phone] 02/06/2019, 10:01 AM

## 2019-02-06 NOTE — Progress Notes (Signed)
Patient stated he did not wish CPAP as he is only going to be here one night.

## 2019-02-06 NOTE — Consult Note (Signed)
HPI: Patient was seen at bedside along with his wife. He was admitted for fever and possible sepsis due to left great toe ulceration. His wife states the foot was red but the redness has improved since he is getting IV antibiotics. He mentioned that he has been following wound care center and they order MRI as out patients. He also mentioned that wound care referred him to vein and vascular to see if they can improve his blood flow but he has not heard anything from vein and vascular regarding the appointment.       Past Medical History:  Diagnosis Date  . Apnea, sleep   . CKD (chronic kidney disease)   . Diabetes mellitus without complication (HCC)   . GERD (gastroesophageal reflux disease)   . Hyperkalemia   . Neuropathy   . Obstructive sleep apnea on CPAP   . RA (rheumatoid arthritis) (HCC)   . Renal disorder            Past Surgical History:  Procedure Laterality Date  . ambutation toes    . AMPUTATION Left 09/10/2018   Procedure: AMPUTATION DIGIT LEFT 3RD TOE;  Surgeon: Erskine Emery, DPM;  Location: [facility];  Service: Podiatry;  Laterality: Left;  . CHOLECYSTECTOMY    . LEG AMPUTATION BELOW KNEE  03/2017      Social History:     Social History       Tobacco Use  . Smoking status: Never Smoker  . Smokeless tobacco: Never Used  Substance Use Topics  . Alcohol use: No     Lives -home with wife  Mobility -has a right leg prosthesis     Family History :   No family history of heart disease or diabetes  Home Medications:          Prior to Admission medications   Medication Sig Start Date End Date Taking? Authorizing Provider  aspirin EC 81 MG tablet Take 81 mg by mouth daily.   Yes [provider]  carboxymethylcellulose (REFRESH PLUS) 0.5 % SOLN Apply 1 drop to eye 3 (three) times daily as needed (for chronic dry eye).    Yes [provider]  Cholecalciferol 2000 units CAPS Take 2,000 Units by mouth  daily.   Yes [provider]  erythromycin ophthalmic ointment Place 1 application into both eyes at bedtime. Apply a thin layer into both eyes 01/26/19  Yes [provider]  Flaxseed, Linseed, (FLAX SEED OIL PO) Take 1,200 mg by mouth daily.   Yes [provider]  HUMALOG 100 UNIT/ML injection 80-85 Units by Pump Prime route See admin instructions. Approximately 80-85 units/24hrs via insulin pump 07/11/15  Yes [provider]  leflunomide (ARAVA) 10 MG tablet Take 10 mg by mouth daily.   Yes [provider]  methotrexate (RHEUMATREX) 2.5 MG tablet Take 10 mg by mouth every Sunday.  07/06/15  Yes [provider]  Multiple Vitamin (MULTIVITAMIN WITH MINERALS) TABS tablet Take 1 tablet by mouth every morning.   Yes [provider]  omeprazole (PRILOSEC) 10 MG capsule Take 10 mg by mouth every morning.    Yes [provider]  OVER THE COUNTER MEDICATION Take 2,688 mcg by mouth daily. DE Dry Eye Omega 2688 mcg Supplement   Yes [provider]  RESTASIS 0.05 % ophthalmic emulsion Place 1 drop into both eyes 2 (two) times daily.  01/26/19  Yes [provider]  vitamin E 1000 UNIT capsule Take 1,000 Units by mouth daily.  Vascular Studies:   Right: BKA. Left: Resting left ankle-brachial index indicates noncompressible left lower extremity arteries.The left toe-brachial index is abnormal. ABIs are unreliable. LT Great toe pressure = 83 mmHg.  Triphasic/biphasic waveforms.  Radiograph. I personally reviewed the radiograph and recommend MRI.   IMPRESSION: 1. Positive acute osteomyelitis at the medial head of the 1st metatarsal and base of the 1st proximal phalanx.  Physical exam: Right side BKA.   Left foot:  Vascular: DP pulses palpable. PT nonpalpable. hairgrowth diminished. Capillary refill time to hallux is brisk.  Derm: There is full thickness ulceration noted at the medial aspect of  the left great toe, with surround erythema. Ulcer is measured to be 2.0x1.5x0.4. No streaking redness noted. No odor noted. No purulent discharge noted.No fluctuance noted. Increase in warmth noted. Tenderness at the ulcer site. Amputation of the 2nd,3rd and 5th noted.  Musculoskeletol: Right side, BKA with prosthetic. Left side multiple toe amputation noted. Neurology: Protective sensation is diminished.   A: Left hallux ulceration with suspected osteomyelitis.  Cellulitis.   Plan: patient was examined and evaluated at bedside.  I applied betadine dressing to the left great toe. Recommend changing daily. Do not get the foot wet. Keep it clean, dry and intact.  Will await on MRI.  I discussed surgery with patient and he does not want surgery. Patient wants to get 6-8 weeks of IV antibiotics and treat the bone infection that way. Patient has BKA on the right and would likely need transmetatarsal amputation if surgery is required. 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 He is better off with IV antibiotics via PICC line.  Patient will need to follow up with wound care once discharge.  Patient will also need opinion from vein and vascular doctor. I will call them on Monday and try to schedule patient as soon as possible as out patient.  No acute surgical intervention is needed at this time.  Patient can be discharge from my point of view with PICC line services.  I will follow up with patient while he in hospital.

## 2019-02-07 DIAGNOSIS — M009 Pyogenic arthritis, unspecified: Secondary | ICD-10-CM | POA: Diagnosis present

## 2019-02-07 DIAGNOSIS — E1121 Type 2 diabetes mellitus with diabetic nephropathy: Secondary | ICD-10-CM | POA: Diagnosis present

## 2019-02-07 LAB — CBC WITH DIFFERENTIAL/PLATELET
ABS IMMATURE GRANULOCYTES: 0.03 10*3/uL (ref 0.00–0.07)
Basophils Absolute: 0 10*3/uL (ref 0.0–0.1)
Basophils Relative: 1 %
Eosinophils Absolute: 0.2 10*3/uL (ref 0.0–0.5)
Eosinophils Relative: 5 %
HCT: 38.7 % — ABNORMAL LOW (ref 39.0–52.0)
Hemoglobin: 12.4 g/dL — ABNORMAL LOW (ref 13.0–17.0)
Immature Granulocytes: 1 %
Lymphocytes Relative: 9 %
Lymphs Abs: 0.4 10*3/uL — ABNORMAL LOW (ref 0.7–4.0)
MCH: 29.7 pg (ref 26.0–34.0)
MCHC: 32 g/dL (ref 30.0–36.0)
MCV: 92.6 fL (ref 80.0–100.0)
MONOS PCT: 26 %
Monocytes Absolute: 1.1 10*3/uL — ABNORMAL HIGH (ref 0.1–1.0)
Neutro Abs: 2.5 10*3/uL (ref 1.7–7.7)
Neutrophils Relative %: 58 %
Platelets: 133 10*3/uL — ABNORMAL LOW (ref 150–400)
RBC: 4.18 MIL/uL — ABNORMAL LOW (ref 4.22–5.81)
RDW: 15.4 % (ref 11.5–15.5)
WBC: 4.2 10*3/uL (ref 4.0–10.5)
nRBC: 0 % (ref 0.0–0.2)

## 2019-02-07 LAB — RENAL FUNCTION PANEL
Albumin: 2.7 g/dL — ABNORMAL LOW (ref 3.5–5.0)
Anion gap: 8 (ref 5–15)
BUN: 17 mg/dL (ref 8–23)
CO2: 26 mmol/L (ref 22–32)
Calcium: 8.4 mg/dL — ABNORMAL LOW (ref 8.9–10.3)
Chloride: 103 mmol/L (ref 98–111)
Creatinine, Ser: 1.13 mg/dL (ref 0.61–1.24)
GFR calc non Af Amer: >60 mL/min (ref >60)
Glucose, Bld: 73 mg/dL (ref 70–99)
Phosphorus: 3.7 mg/dL (ref 2.5–4.6)
Potassium: 3.7 mmol/L (ref 3.5–5.1)
Sodium: 137 mmol/L (ref 135–145)

## 2019-02-07 LAB — GLUCOSE, CAPILLARY
GLUCOSE-CAPILLARY: 196 mg/dL — AB (ref 70–99)
Glucose-Capillary: 108 mg/dL — ABNORMAL HIGH (ref 70–99)
Glucose-Capillary: 62 mg/dL — ABNORMAL LOW (ref 70–99)
Glucose-Capillary: 171 mg/dL — ABNORMAL HIGH (ref 70–99)

## 2019-02-07 MED ORDER — AMOXICILLIN-POT CLAVULANATE 875-125 MG PO TABS: 1.0000 | ORAL_TABLET | Freq: Two times a day (BID) | ORAL | 0 refills | Status: AC

## 2019-02-07 MED ORDER — VANCOMYCIN HCL IN DEXTROSE 750-5 MG/150ML-% IV SOLN: 750.0000 mg | Freq: Two times a day (BID) | INTRAVENOUS | 0 refills | Status: AC

## 2019-02-07 NOTE — Progress Notes (Signed)
Patient discharged.  Patient questions answered about medications and home health.  Patient and wife verbalized understanding.  Patient left floor with wife with picc in intact.  Home health to see patient tomorrow.  Patient left floor in stable condition.

## 2019-02-07 NOTE — Discharge Summary (Addendum)
Physician Discharge Summary  Andrew Schultz ZOX:[id number] DOB: 04-13-1946 DOA: 02/05/2019  PCP: Benita Stabile, MD  Admit date: 02/05/2019 Discharge date: 02/07/2019  Admitted From: Home Disposition: Home  Recommendations for Outpatient Follow-up:  1. Follow up with PCP in 1 week. 2. Follow-up with podiatrist Dr. Allena Katz in 1-2 weeks.  Patient is being discharged on IV vancomycin and Augmentin (plan to treat for at least 6 weeks.  Dr. Allena Katz will review any outpatient wound culture done recently and decide on antibiotic regimen).  Home Health: RN and PT Equipment/Devices: PICC line, walker  Discharge Condition: Fair CODE STATUS: Full code Diet recommendation: Carb modified    Discharge Diagnoses:  Principal Problem:   Acute osteomyelitis of left foot (HCC)  Active Problems: Cellulitis of left foot   Diabetic foot ulcer (HCC)   Obstructive sleep apnea on CPAP   RA (rheumatoid arthritis) (HCC)   Diabetic neuropathy (HCC)   Septic arthritis of IP joint of toe, left (HCC)   Diabetic nephropathy (HCC)   Brief narrative/HPI Please refer to admission H&P for details, in brief,73 y.o. male, with history of diabetes mellitus on insulin pump, peripheral neuropathy, rheumatoid arthritis, chronic kidney disease stage?  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  He reports that last week he did have some URI symptoms and had fever of 101 F about 4 days back.  He developed an ulcer over the medial aspect of the left foot almost 4 months back and was referred by his podiatry to wound care clinic.  He noticed some warmth over his ulcer and given development of fever decided to come to the ED. He denies any trauma to the leg reports some chills but denies headache, blurred vision, dizziness, nausea,  vomiting, chest pain, shortness of breath, abdominal pain, dysuria, diarrhea, weakness of the extremities.  Denies any sick contact or recent travel.  Course in the ED Vitals were stable.  Blood work showed WBC of 5.7, hemoglobin of 12.9, platelet of 146, sodium of 132, creatinine of 1.36, glucose of 236.  X-ray of the left foot showed acute osteomyelitis at the medial head of the first metatarsal and base of the first proximal phalanx.  Also showed chronic soft tissue wound with new soft tissue gas at the dorsum of the foot. Patient given a dose of IV vancomycin and hospitalist consulted for admission to medical floor.  Hospital course Acute osteomyelitis of the left great toe As seen on x-ray, further confirmed with MRI of the left foot showing septic arthritis at the first MTP joint with osteomyelitis involving the first metatarsal, first proximal phalanx and also the sesamoid bone of the great toe.  Also shows cellulitis without abscess or pyomyositis. Seen by his podiatrist Dr. Allena Katz who recommended continuing IV antibiotic for now, daily dressing to keep it clean dry and intact. He discussed surgical option with the patient and patient did not want surgery also felt that with his right leg amputated he would need a transmetatarsal amputation of the left foot surgery was required and with his underlying left foot charcot his balance would not be good.  Recommended to continue prolonged antibiotics (6-8 weeks) for now. Patient did not have blood culture or MRSA PCR done on admission.  He is currently on IV vancomycin cefepime and Flagyl.  I discussed antibiotic option with ID on call Dr. Orvan Falconer who recommended Augmentin for now.  I then further discussed with Dr. Allena Katz who recommended IV antibiotic for the time being and he will communicate with wound care early next week to see if any wound culture have been sent and adjust antibiotic based on any culture sensitivity available.  (No results in the  system).  Patient also reported being referred to vascular surgery from wound care clinic but has not heard from them.  Dr. Allena Katz will contact vascular surgery early next week to arrange appointment. Recommended to follow-up with the wound care clinic.  For now I will be discharging him on IV vancomycin 750 mg twice daily along with oral Augmentin until further antibiotic regimen is adjusted by Dr. Allena Katz as outpatient. Patient had a PICC line placed already and can be discharged home with home health. Patient needs weekly CBC and comprehensive metabolic panel with results sent to his PCP or Dr. Allena Katz while he is on IV antibiotics.  Active problems   Diabetic foot ulcer (HCC) Wound care consult.    Obstructive sleep apnea on CPAP Nighttime CPAP  Uncontrolled diabetes mellitus with hyperglycemia, insulin-dependent with diabetes neuropathy Resumed  insulin pump.  CBG stable upon discharge.  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  Monitor.  Procedure: MRI left foot Consults: Dr. Erskine Emery (podiatry)  Family communication: None at bedside Disposition: Home   Discharge Instructions   Allergies as of 02/07/2019   No Known Allergies     Medication List    TAKE these medications   amoxicillin-clavulanate 875-125 MG tablet Commonly known as:  Augmentin Take 1 tablet by mouth 2 (two) times daily for 28 days.   aspirin EC 81 MG tablet Take 81 mg by mouth daily.   carboxymethylcellulose 0.5 % Soln Commonly known as:  REFRESH PLUS Apply 1 drop to eye 3 (three) times daily as needed (for chronic dry eye).   Cholecalciferol 50 MCG (2000 UT) Caps Take 2,000 Units by mouth daily.   erythromycin ophthalmic ointment Place 1 application into both eyes at bedtime. Apply a thin layer into both eyes   FLAX SEED OIL PO Take 1,200 mg by mouth daily.   HumaLOG 100 UNIT/ML injection Generic drug:  insulin  lispro 80-85 Units by Pump Prime route See admin instructions. Approximately 80-85 units/24hrs via insulin pump   leflunomide 10 MG tablet Commonly known as:  ARAVA Take 10 mg by mouth daily.   methotrexate 2.5 MG tablet Commonly known as:  RHEUMATREX Take 10 mg by mouth every Sunday.   multivitamin with minerals Tabs tablet Take 1 tablet by mouth every morning.   omeprazole 10 MG capsule Commonly known as:  PRILOSEC Take 10 mg by mouth every morning.   OVER THE COUNTER MEDICATION Take 2,688 mcg by mouth daily. DE Dry Eye Omega 2688 mcg Supplement   Restasis 0.05 % ophthalmic emulsion Generic drug:  cycloSPORINE Place 1 drop into both eyes 2 (two) times daily.   Vancomycin 750-5 MG/150ML-% Soln Commonly known as:  VANCOCIN Inject 150 mLs (750 mg total) into the vein every 12 (twelve) hours for 14 days.   vitamin E 1000 UNIT capsule Take 1,000 Units by mouth daily.      Follow-up Information    Benita Stabile, MD Follow up in 1 week(s).   Specialty:  Internal Medicine Contact information: [address] [place] [address] [phone]  Erskine Emery, DPM. Schedule an appointment as soon as possible for a visit in 1 week(s).   Specialty:  Podiatry Contact information: [address] [place] [address] [phone]        Maxwell Caul, MD Follow up in 2 week(s).   Specialty:  Internal Medicine Contact information: [address] [place] [address] [phone]          No Known Allergies   Procedures/Studies: Dg Chest 2 View  Result Date: 02/05/2019 CLINICAL DATA:  73 year old male with fever for 2 days. Left foot wound. Diabetes. EXAM: CHEST - 2 VIEW COMPARISON:  Chest radiographs 07/31/2015. FINDINGS: Chronic staple line in the right upper lung. Stable lung volumes. Mediastinal contours remain within normal limits. Visualized tracheal air column is within normal limits. No pneumothorax, pulmonary edema,  pleural effusion or acute pulmonary opacity. Chronic right lateral pleural thickening is stable. No acute osseous abnormality identified. Stable cholecystectomy clips. Negative visible bowel gas pattern. IMPRESSION: No acute cardiopulmonary abnormality. Electronically Signed   By: [name] M.D.   On: 02/05/2019 17:08   Mr Foot Left Wo Contrast  Result Date: 02/06/2019 CLINICAL DATA:  Chronic left foot ulcer.  Fever. EXAM: MRI OF THE LEFT FOOT WITHOUT CONTRAST TECHNIQUE: Multiplanar, multisequence MR imaging of the left foot was performed. No intravenous contrast was administered. COMPARISON:  Radiographs 02/05/2019 FINDINGS: There is an open wound along the medial aspect of the first MTP joint which appears to extend right down to the bone. Abnormal T1 and T2 signal intensity in the first metatarsal head and neck and proximal phalanx consistent with septic arthritis and osteomyelitis. There is also osteomyelitis involving the sesamoid bones. No discrete drainable soft tissue abscess but there is moderate cellulitis. No findings for pyomyositis. 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 IMPRESSION: 1. MR findings consistent with septic arthritis at the first MTP joint with osteomyelitis involving the first metatarsal, first proximal phalanx and also the sesamoid bones of the great toe. 2. Cellulitis without findings for discrete drainable abscess or pyomyositis. Electronically Signed   By: Rudie Meyer M.D.   On: 02/06/2019 18:13   Dg Foot Complete Left  Addendum Date: 02/05/2019   ADDENDUM REPORT: 02/05/2019 17:20 ADDENDUM: Study discussed by telephone with Dr. Geoffery Lyons on 02/05/2019 at 1714 hours. Electronically Signed   By: [name] M.D.   On: 02/05/2019 17:20   Result Date: 02/05/2019 CLINICAL DATA:  73 year old male with fever for 2 days. Left foot wound. Diabetes. EXAM: LEFT FOOT - COMPLETE 3+ VIEW COMPARISON:  Left foot series 01/06/2019.  FINDINGS: Multifocal previous left foot amputations. Sclerotic neuropathic changes to the residual proximal metatarsals. Chronic soft tissue wound overlying the medial head of the 1st metatarsal with interval bony fragmentation and subtle cortical osteolysis. Similar subtle changes to the medial base of the 1st proximal phalanx since February. Calcified peripheral vascular disease. Small volume of soft tissue gas tracking in the dorsum of the foot. IMPRESSION: 1. Positive acute osteomyelitis at the medial head of the 1st metatarsal and base of the 1st proximal phalanx. 2. Overlying chronic soft tissue wound. New soft tissue gas at the dorsum of the foot. Electronically Signed: By: [name] M.D. On: 02/05/2019 17:11   Vas [place] Vanice Sarah With/wo Tbi  Result Date: 01/19/2019 LOWER EXTREMITY DOPPLER STUDY Indications: Ulceration. High Risk Factors: Diabetes, no history of smoking.  Vascular Interventions: 09/10/2018: amputation left third toe  04/05/2017: right BKA. Performing Technologist: [place] [employer] RVT  Examination Guidelines: A complete evaluation includes at minimum, Doppler waveform signals and systolic blood pressure reading at the level of bilateral brachial, anterior tibial, and posterior tibial arteries, when vessel segments are accessible. Bilateral testing is considered an integral part of a complete examination. Photoelectric Plethysmograph (PPG) waveforms and toe systolic pressure readings are included as required and additional duplex testing as needed. Limited examinations for reoccurring indications may be performed as noted.  ABI Findings: +--------+------------------+-----+--------+--------+ Right   Rt Pressure (mmHg)IndexWaveformComment  +--------+------------------+-----+--------+--------+ [id number]                            BKA      +--------+------------------+-----+--------+--------+ +---------+------------------+-----+---------+-------+ Left     Lt Pressure  (mmHg)IndexWaveform Comment +---------+------------------+-----+---------+-------+ Brachial 152                                     +---------+------------------+-----+---------+-------+ PTA      247               1.62 triphasic        +---------+------------------+-----+---------+-------+ DP       251               1.65 biphasic         +---------+------------------+-----+---------+-------+ Randie HeinzGreat Toe83                0.55                  +---------+------------------+-----+---------+-------+ +-------+-----------+-----------+------------+------------+ ABI/TBIToday's ABIToday's TBIPrevious ABIPrevious TBI +-------+-----------+-----------+------------+------------+ Right  BKA                   1.54                     +-------+-----------+-----------+------------+------------+ Left   1.65       0.55       Girard          0.95         +-------+-----------+-----------+------------+------------+ Left ABIs appear essentially unchanged compared to prior study on 01/17/2017. Left TBIs appear decreased compared to prior study on 01/17/2017.  Summary: Right: BKA. Left: Resting left ankle-brachial index indicates noncompressible left lower extremity arteries.The left toe-brachial index is abnormal. ABIs are unreliable. LT Great toe pressure = 83 mmHg. Triphasic/biphasic waveforms.  *See table(s) above for measurements and observations.  Electronically signed by Coral ElseVance Brabham MD on 01/19/2019 at 11:15:26 AM.   Final     (Echo, Carotid, EGD, Colonoscopy, ERCP)    Subjective:   Discharge Exam: Vitals:   02/07/19 0600 02/07/19 1000  BP: 115/65 (!) (P) 100/55  Pulse: 77 (P) 83  Resp: 20 (P) 20  Temp: 98.3 F (36.8 C) (P) 98.1 F (36.7 C)  SpO2: 95% (P) 95%   Vitals:   02/06/19 2122 02/06/19 2123 02/07/19 0600 02/07/19 1000  BP: 126/70 126/70 115/65 (!) (P) 100/55  Pulse: 85 85 77 (P) 83  Resp: 19 19 20  (P) 20  Temp:  98.5 F (36.9 C) 98.3 F (36.8 C) (P) 98.1 F  (36.7 C)  TempSrc:  Oral  (P) Oral  SpO2: 96% 96% 95% (P) 95%  Weight:      Height:        General: Not in distress HEENT: Moist mucosa, supple neck Chest: Clear bilaterally CVs: Normal S1-2, no murmurs GI: Soft, nondistended,  nontender Musculoskeletal: Warm, dressing over the left foot, right BKA     The results of significant diagnostics from this hospitalization (including imaging, microbiology, ancillary and laboratory) are listed below for reference.     Microbiology: No results found for this or any previous visit (from the past 240 hour(s)).   Labs: BNP (last 3 results) No results for input(s): BNP in the last 8760 hours. Basic Metabolic Panel: Recent Labs  Lab 02/05/19 1613 02/06/19 0424 02/07/19 0617  NA 132* 137 137  K 4.7 4.4 3.7  CL 99 100 103  CO2 25 28 26   GLUCOSE 242* 78 73  BUN 22 18 17   CREATININE 1.36* 1.29* 1.13  CALCIUM 8.8* 8.7* 8.4*  PHOS  --   --  3.7   Liver Function Tests: Recent Labs  Lab 02/07/19 0617  ALBUMIN 2.7*   No results for input(s): LIPASE, AMYLASE in the last 168 hours. No results for input(s): AMMONIA in the last 168 hours. CBC: Recent Labs  Lab 02/05/19 1613 02/06/19 0424 02/07/19 0617  WBC 5.7 5.2 4.2  NEUTROABS 4.4  --  2.5  HGB 12.9* 12.7* 12.4*  HCT 39.9 39.4 38.7*  MCV 91.1 91.4 92.6  PLT 146* 149* 133*   Cardiac Enzymes: No results for input(s): CKTOTAL, CKMB, CKMBINDEX, TROPONINI in the last 168 hours. BNP: Invalid input(s): POCBNP CBG: Recent Labs  Lab 02/06/19 1113 02/06/19 1606 02/06/19 2124 02/07/19 0302 02/07/19 0732  GLUCAP 263* 78 190* 108* 62*   D-Dimer No results for input(s): DDIMER in the last 72 hours. Hgb A1c No results for input(s): HGBA1C in the last 72 hours. Lipid Profile No results for input(s): CHOL, HDL, LDLCALC, TRIG, CHOLHDL, LDLDIRECT in the last 72 hours. Thyroid function studies No results for input(s): TSH, T4TOTAL, T3FREE, THYROIDAB in the last 72  hours.  Invalid input(s): FREET3 Anemia work up No results for input(s): VITAMINB12, FOLATE, FERRITIN, TIBC, IRON, RETICCTPCT in the last 72 hours. Urinalysis    Component Value Date/Time   COLORURINE YELLOW 12/14/2016 1330   APPEARANCEUR CLOUDY (A) 12/14/2016 1330   LABSPEC >1.030 (H) 12/14/2016 1330   PHURINE 5.5 12/14/2016 1330   GLUCOSEU 250 (A) 12/14/2016 1330   HGBUR NEGATIVE 12/14/2016 1330   BILIRUBINUR NEGATIVE 12/14/2016 1330   KETONESUR NEGATIVE 12/14/2016 1330   PROTEINUR NEGATIVE 12/14/2016 1330   NITRITE NEGATIVE 12/14/2016 1330   LEUKOCYTESUR NEGATIVE 12/14/2016 1330   Sepsis Labs Invalid input(s): PROCALCITONIN,  WBC,  LACTICIDVEN Microbiology No results found for this or any previous visit (from the past 240 hour(s)).   Time coordinating discharge: 35 minutes  SIGNED:   Eddie NorthNishant Ventura Hollenbeck, MD  [facility] 02/07/2019, 11:07 AM Pager   If 7PM-7AM, please contact night-coverage [email or web address] Password [email or web address]

## 2019-02-07 NOTE — TOC Transition Note (Signed)
Transition of Care [facility]) - CM/SW Discharge Note   Patient Details  Name: Andrew Schultz MRN: [id number] Date of Birth: [birth year]  [facility] CM/SW Contact:  Virgel Manifold, RN Phone Number: 02/07/2019, 2:28 PM   Clinical Narrative:  Patient to be discharged per MD order. Orders in place for home health services. Patient is in need of 6 weeks of IV abx therapy. Orders in place for IV vanc. Notified Pam from [facility] infusion. Coordination of abx being managed now.      Final next level of care: Home w Home Health Services Barriers to Discharge: No Barriers Identified   Patient Goals and CMS Choice   CMS Medicare.gov Compare Post Acute Care list provided to:: Patient Choice offered to / list presented to : Patient  Discharge Placement                       Discharge Plan and Services Discharge Planning Services: CM Consult Post Acute Care Choice: Home Health              HH Arranged: RN, IV Antibiotics HH Agency: [facility]   Social Determinants of Health (SDOH) Interventions     Readmission Risk Interventions No flowsheet data found.

## 2019-02-07 NOTE — Discharge Instructions (Signed)
Osteomyelitis, Adult    Bone infections (osteomyelitis) occur when bacteria or other germs get inside a bone. This can happen if you have an infection in another part of your body that spreads through your blood. Germs from your skin or from outside of your body can also cause this type of infection if you have a wound or a broken bone (fracture) that breaks the skin.  Bone infections need to be treated quickly to prevent bone damage and to prevent the infection from spreading to other areas of your body.  What are the causes?  Most bone infections are caused by bacteria. They can also be caused by other germs, such as viruses and funguses.  What increases the risk?  You are more likely to develop this condition if you:  · Recently had surgery, especially bone or joint surgery.  · Have a long-term (chronic) disease, such as:  ? Diabetes.  ? HIV (human immunodeficiency virus).  ? Rheumatoid arthritis.  ? Sickle cell anemia.  ? Kidney disease that requires dialysis.  · Are aged 60 years or older.  · Have a condition or take medicines that block or weaken your body's defense system (immune system).  · Have a condition that reduces your blood flow.  · Have an artificial joint.  · Have had a joint or bone repaired with plates or screws (surgical hardware).  · Use IV drugs.  · Have a central line for IV access.  · Have had trauma, such as stepping on a nail or a broken bone that came through the skin.  What are the signs or symptoms?  Symptoms vary depending on the type and location of your infection. Common symptoms of bone infections include:  · Fever and chills.  · Skin redness and warmth.  · Swelling.  · Pain and stiffness.  · Drainage of fluid or pus near the infection.  How is this diagnosed?  This condition may be diagnosed based on:  · Your symptoms and medical history.  · A physical exam.  · Tests, such as:  ? A sample of tissue, fluid, or blood taken to be examined under a microscope.  ? Pus or discharge swabbed  from a wound for testing to identify germs and to determine what type of medicine will kill them (culture and sensitivity).  ? Blood tests.  · Imaging studies. These may include:  ? X-rays.  ? MRI.  ? CT scan.  ? Bone scan.  ? Ultrasound.  How is this treated?  Treatment for this condition depends on the cause and type of infection. Antibiotic medicines are usually the first treatment for a bone infection. This may be done in a hospital at first. You may have to continue IV antibiotics at home or take antibiotics by mouth for several weeks after that.  Other treatments may include surgery to remove:  · Dead or dying tissue from a bone.  · An infected artificial joint.  · Infected plates or screws that were used to repair a broken bone.  Follow these instructions at home:  Medicines    · Take over-the-counter and prescription medicines only as told by your health care provider.  · Take your antibiotic medicine as told by your health care provider. Do not stop taking the antibiotic even if you start to feel better.  · Follow instructions from your health care provider about how to take IV antibiotics at home. You may need to have a nurse come to your home to   give you the IV antibiotics.  General instructions    · Ask your health care provider if you have any restrictions on your activities.  · If directed, put ice on the affected area:  ? Put ice in a plastic bag.  ? Place a towel between your skin and the bag.  ? Leave the ice on for 20 minutes, 2-3 times a day.  · Wash your hands often with soap and water. If soap and water are not available, use hand sanitizer.  · Do not use any products that contain nicotine or tobacco, such as cigarettes and e-cigarettes. These can delay bone healing. If you need help quitting, ask your health care provider.  · Keep all follow-up visits as told by your health care provider. This is important.  Contact a health care provider if:  · You develop a fever or chills.  · You have  redness, warmth, pain, or swelling that returns after treatment.  Get help right away if:  · You have rapid breathing or you have trouble breathing.  · You have chest pain.  · You cannot drink fluids or make urine.  · The affected area swells, changes color, or turns blue.  · You have numbness or severe pain in the affected area.  Summary  · Bone infections (osteomyelitis) occur when bacteria or other germs get inside a bone.  · You may be more likely to get this type of infection if you have a condition, such as diabetes, that lowers your ability to fight infection or increases your chances of getting an infection.  · Most bone infections are caused by bacteria. They can also be caused by other germs, such as viruses and funguses.  · Treatment for this condition usually starts with taking antibiotics. Further treatment depends on the cause and type of infection.  This information is not intended to replace advice given to you by your health care provider. Make sure you discuss any questions you have with your health care provider.  Document Released: 11/12/2005 Document Revised: 11/21/2017 Document Reviewed: 11/21/2017  [employer]vier Interactive Patient Education © 2019 [employer]

## 2019-02-08 ENCOUNTER — Other Ambulatory Visit (HOSPITAL_COMMUNITY)
Admission: RE | Admit: 2019-02-08 | Discharge: 2019-02-08 | Disposition: A | Discharge status: Home / Self Care | Payer: Medicare Other | Source: Other Acute Inpatient Hospital | Attending: Internal Medicine | Admitting: Internal Medicine

## 2019-02-08 DIAGNOSIS — Z5181 Encounter for therapeutic drug level monitoring: Secondary | ICD-10-CM | POA: Insufficient documentation

## 2019-02-08 LAB — VANCOMYCIN, TROUGH: Vancomycin Tr: 16 ug/mL (ref 15–20)

## 2019-02-08 LAB — COMPREHENSIVE METABOLIC PANEL
ALT: 30 U/L (ref 0–44)
ANION GAP: 8 (ref 5–15)
AST: 33 U/L (ref 15–41)
Albumin: 2.6 g/dL — ABNORMAL LOW (ref 3.5–5.0)
Alkaline Phosphatase: 84 U/L (ref 38–126)
BUN: 20 mg/dL (ref 8–23)
CO2: 25 mmol/L (ref 22–32)
Calcium: 8.3 mg/dL — ABNORMAL LOW (ref 8.9–10.3)
Chloride: 101 mmol/L (ref 98–111)
Creatinine, Ser: 1.22 mg/dL (ref 0.61–1.24)
GFR calc Af Amer: >60 mL/min (ref >60)
GFR calc non Af Amer: 58 mL/min — ABNORMAL LOW (ref >60)
Glucose, Bld: 251 mg/dL — ABNORMAL HIGH (ref 70–99)
POTASSIUM: 4 mmol/L (ref 3.5–5.1)
Sodium: 134 mmol/L — ABNORMAL LOW (ref 135–145)
Total Bilirubin: 0.6 mg/dL (ref 0.3–1.2)
Total Protein: 5.9 g/dL — ABNORMAL LOW (ref 6.5–8.1)

## 2019-02-08 LAB — CBC WITH DIFFERENTIAL/PLATELET
Abs Immature Granulocytes: 0.02 10*3/uL (ref 0.00–0.07)
Basophils Absolute: 0 10*3/uL (ref 0.0–0.1)
Basophils Relative: 1 %
Eosinophils Absolute: 0.4 10*3/uL (ref 0.0–0.5)
Eosinophils Relative: 9 %
HCT: 38.2 % — ABNORMAL LOW (ref 39.0–52.0)
Hemoglobin: 12.4 g/dL — ABNORMAL LOW (ref 13.0–17.0)
IMMATURE GRANULOCYTES: 1 %
Lymphocytes Relative: 7 %
Lymphs Abs: 0.3 10*3/uL — ABNORMAL LOW (ref 0.7–4.0)
MCH: 29.8 pg (ref 26.0–34.0)
MCHC: 32.5 g/dL (ref 30.0–36.0)
MCV: 91.8 fL (ref 80.0–100.0)
Monocytes Absolute: 0.7 10*3/uL (ref 0.1–1.0)
Monocytes Relative: 16 %
NEUTROS PCT: 66 %
NRBC: 0 % (ref 0.0–0.2)
Neutro Abs: 3 10*3/uL (ref 1.7–7.7)
Platelets: 134 10*3/uL — ABNORMAL LOW (ref 150–400)
RBC: 4.16 MIL/uL — ABNORMAL LOW (ref 4.22–5.81)
RDW: 15.9 % — ABNORMAL HIGH (ref 11.5–15.5)
WBC: 4.4 10*3/uL (ref 4.0–10.5)

## 2019-02-17 DIAGNOSIS — E10610 Type 1 diabetes mellitus with diabetic neuropathic arthropathy: Secondary | ICD-10-CM | POA: Diagnosis not present

## 2019-02-17 DIAGNOSIS — Z89421 Acquired absence of other right toe(s): Secondary | ICD-10-CM | POA: Diagnosis not present

## 2019-02-17 DIAGNOSIS — Z89511 Acquired absence of right leg below knee: Secondary | ICD-10-CM | POA: Diagnosis not present

## 2019-02-17 DIAGNOSIS — E10621 Type 1 diabetes mellitus with foot ulcer: Secondary | ICD-10-CM | POA: Diagnosis not present

## 2019-02-17 DIAGNOSIS — E1042 Type 1 diabetes mellitus with diabetic polyneuropathy: Secondary | ICD-10-CM | POA: Diagnosis not present

## 2019-02-17 DIAGNOSIS — Z89411 Acquired absence of right great toe: Secondary | ICD-10-CM | POA: Diagnosis not present

## 2019-02-17 DIAGNOSIS — Z794 Long term (current) use of insulin: Secondary | ICD-10-CM | POA: Diagnosis not present

## 2019-02-17 DIAGNOSIS — Z89432 Acquired absence of left foot: Secondary | ICD-10-CM | POA: Diagnosis not present

## 2019-02-17 DIAGNOSIS — G4730 Sleep apnea, unspecified: Secondary | ICD-10-CM | POA: Diagnosis not present

## 2019-02-17 DIAGNOSIS — L97525 Non-pressure chronic ulcer of other part of left foot with muscle involvement without evidence of necrosis: Secondary | ICD-10-CM | POA: Diagnosis not present

## 2019-02-17 DIAGNOSIS — E1051 Type 1 diabetes mellitus with diabetic peripheral angiopathy without gangrene: Secondary | ICD-10-CM | POA: Diagnosis not present

## 2019-02-18 ENCOUNTER — Other Ambulatory Visit: Payer: Self-pay

## 2019-02-19 ENCOUNTER — Other Ambulatory Visit: Payer: Self-pay

## 2019-02-19 ENCOUNTER — Other Ambulatory Visit: Payer: Self-pay | Admitting: *Deleted

## 2019-02-19 ENCOUNTER — Encounter (HOSPITAL_COMMUNITY): Payer: Medicare Other

## 2019-02-19 ENCOUNTER — Encounter: Payer: Self-pay | Admitting: *Deleted

## 2019-02-19 ENCOUNTER — Encounter: Payer: Self-pay | Admitting: Vascular Surgery

## 2019-02-19 ENCOUNTER — Ambulatory Visit (INDEPENDENT_AMBULATORY_CARE_PROVIDER_SITE_OTHER): Payer: Medicare Other | Admitting: Vascular Surgery

## 2019-02-19 VITALS — BP 108/66 | HR 85 | Temp 98.0°F | Resp 18 | Ht 67.0 in | Wt 199.0 lb

## 2019-02-19 DIAGNOSIS — I739 Peripheral vascular disease, unspecified: Secondary | ICD-10-CM | POA: Diagnosis not present

## 2019-02-19 NOTE — Progress Notes (Signed)
  Referring Physician: Dr [name]  Patient name: Andrew Schultz MRN: [id number] DOB: 05/07/1946 Sex: male  REASON FOR CONSULT: Osteomyelitis left foot  HPI: Andrew Schultz is a 72 y.o. male who developed an ulceration over his first metatarsal head in November 2019.  This has failed to heal.  He has been receiving chronic wound care for several months.  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  He has occasionally had fever.  He did have a pneumonia about a month ago and has slowly recovered from this.  He still has some expiratory wheezing related to this.  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  Denies claudication symptoms.  He denies rest pain.  Other medical problems include type 1 diabetes, CKD but most recent serum creatinine was normal, neuropathy, sleep apnea, all have been stable.  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  These previously healed.  He is currently on vancomycin IV through a PICC line.  He is also taking oral Augmentin.  Past Medical History:  Diagnosis Date  . Apnea, sleep   . CKD (chronic kidney disease)   . Diabetes mellitus without complication (HCC)   . GERD (gastroesophageal reflux disease)   . Hyperkalemia   . Neuropathy   . Obstructive sleep apnea on CPAP   . RA (rheumatoid arthritis) (HCC)   . Renal disorder    Past Surgical History:  Procedure Laterality Date  . ambutation toes    . AMPUTATION Left 09/10/2018   Procedure: AMPUTATION DIGIT LEFT 3RD TOE;  Surgeon: Patel, Prayashkumar, DPM;  Location: [facility];  Service: Podiatry;  Laterality: Left;  . CHOLECYSTECTOMY    . LEG AMPUTATION BELOW KNEE  03/2017    History reviewed. No pertinent family history.  SOCIAL HISTORY: Social History   Socioeconomic History  . Marital status: Married    Spouse name: Not on file  . Number of children: Not on file  . Years of education: Not on  file  . Highest education level: Not on file  Occupational History  . Not on file  Social Needs  . Financial resource strain: Patient refused  . Food insecurity:    Worry: Patient refused    Inability: Patient refused  . Transportation needs:    Medical: Patient refused    Non-medical: Patient refused  Tobacco Use  . Smoking status: Never Smoker  . Smokeless tobacco: Never Used  Substance and Sexual Activity  . Alcohol use: No  . Drug use: No  . Sexual activity: Not on file  Lifestyle  . Physical activity:    Days per week: Patient refused    Minutes per session: Patient refused  . Stress: Patient refused  Relationships  . Social connections:    Talks on phone: Patient refused    Gets together: Patient refused    Attends religious service: Patient refused    Active member of club or organization: Patient refused    Attends meetings of clubs or organizations: Patient refused    Relationship status: Patient refused  . Intimate partner violence:    Fear of current or ex partner: Patient refused    Emotionally abused: Patient refused    Physically abused: Patient refused    Forced sexual activity: Patient refused  Other Topics Concern  . Not on file  Social History Narrative  . Not on file    No Known Allergies    Current Outpatient Medications  Medication Sig Dispense Refill  . amoxicillin-clavulanate (AUGMENTIN) 875-125 MG tablet Take 1 tablet by mouth 2 (two) times daily for 28 days. 56 tablet 0  . aspirin EC 81 MG tablet Take 81 mg by mouth daily.    . carboxymethylcellulose (REFRESH PLUS) 0.5 % SOLN Apply 1 drop to eye 3 (three) times daily as needed (for chronic dry eye).     . Cholecalciferol 2000 units CAPS Take 2,000 Units by mouth daily.    Marland Kitchen erythromycin ophthalmic ointment Place 1 application into both eyes at bedtime. Apply a thin layer into both eyes    . Flaxseed, Linseed, (FLAX SEED OIL PO) Take 1,200 mg by mouth daily.    Marland Kitchen HUMALOG 100 UNIT/ML injection  80-85 Units by Pump Prime route See admin instructions. Approximately 80-85 units/24hrs via insulin pump    . leflunomide (ARAVA) 10 MG tablet Take 10 mg by mouth daily.    . methotrexate (RHEUMATREX) 2.5 MG tablet Take 10 mg by mouth every Sunday.     . Multiple Vitamin (MULTIVITAMIN WITH MINERALS) TABS tablet Take 1 tablet by mouth every morning.    Marland Kitchen omeprazole (PRILOSEC) 10 MG capsule Take 10 mg by mouth every morning.     Marland Kitchen OVER THE COUNTER MEDICATION Take 2,688 mcg by mouth daily. DE Dry Eye Omega 2688 mcg Supplement    . RESTASIS 0.05 % ophthalmic emulsion Place 1 drop into both eyes 2 (two) times daily.     . Vancomycin (VANCOCIN) 750-5 MG/150ML-% SOLN Inject 150 mLs (750 mg total) into the vein every 12 (twelve) hours for 14 days. 4000 mL 0  . vitamin E 1000 UNIT capsule Take 1,000 Units by mouth daily.     No current facility-administered medications for this visit.     ROS:   General:  No weight loss, Fever, chills  HEENT: No recent headaches, no nasal bleeding, no visual changes, no sore throat  Neurologic: No dizziness, blackouts, seizures. No recent symptoms of stroke or mini- stroke. No recent episodes of slurred speech, or temporary blindness.  Cardiac: No recent episodes of chest pain/pressure, no shortness of breath at rest.  + shortness of breath with exertion.  Denies history of atrial fibrillation or irregular heartbeat  Vascular: No history of rest pain in feet.  No history of claudication.  + history of non-healing ulcer, No history of DVT   Pulmonary: No home oxygen, no productive cough, no hemoptysis,  No asthma + wheezing  Musculoskeletal:  [ ]  Arthritis, [ ]  Low back pain,  [ ]  Joint pain  Hematologic:No history of hypercoagulable state.  No history of easy bleeding.  No history of anemia  Gastrointestinal: No hematochezia or melena,  No gastroesophageal reflux, no trouble swallowing  Urinary: [X]  chronic Kidney disease, [ ]  on HD - [ ]  MWF or [ ]  TTHS, [ ]   Burning with urination, [ ]  Frequent urination, [ ]  Difficulty urinating;   Skin: No rashes  Psychological: No history of anxiety,  No history of depression   Physical Examination  Vitals:   02/19/19 1037  BP: 108/66  Pulse: 85  Resp: 18  Temp: 98 F (36.7 C)  SpO2: 95%  Weight: 199 lb (90.3 kg)  Height: 5\' 7"  (1.702 m)    Body mass index is 31.17 kg/m.  General:  Alert and oriented, no acute distress HEENT: Normal Neck: No bruit or JVD Pulmonary: Bilateral expiratory wheeze Cardiac: Regular Rate and Rhythm without murmur Abdomen: Soft, non-tender, non-distended,  no mass Skin: No rash, 2.5 cm open wound medial aspect left first metatarsal head as depicted below Extremity Pulses:  2+ radial, brachial, femoral, absent left dorsalis pedis, posterior tibial pulses Musculoskeletal: No deformity or edema  Neurologic: Upper and lower extremity motor 5/5 and symmetric        DATA:  Patient had an MRI exam on February 06, 2019 which showed osteomyelitis of the first metatarsal sesamoid bones and proximal phalanx.  Patient had left ABIs performed 03/14/2019 which showed noncompressible vessels toe pressure of 83 left foot  ASSESSMENT: Nonhealing wound left foot with osteomyelitis of the metatarsal head.  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  PLAN: Abdominal aortogram with lower extremity runoff possible intervention scheduled for next week.  Risk benefits possible complications and procedure details were discussed with the patient including not limited to contrast nephropathy bleeding infection vessel injury.  He understands and agrees to proceed.  He also understands that this may require a possible bypass procedure that would be done at a different time.  He also understands that he is high risk for limb loss and certainly risk of amputation of the first toe as this does not currently seem to be clearing with IV antibiotics alone.  Fabienne Bruns, MD [facility] Office: [phone] Pager: [phone]

## 2019-02-19 NOTE — H&P (View-Only) (Signed)
Referring Physician: Dr Leanord Hawkingobson  Patient name: Andrew Schultz MRN: [id number] DOB: [birth year] Sex: male  REASON FOR CONSULT: Osteomyelitis left foot  HPI: Andrew Schultz is a 73 y.o. male who developed an ulceration over his first metatarsal head in November 2019.  This has failed to heal.  He has been receiving chronic wound care for several months.  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  He has occasionally had fever.  He did have a pneumonia about a month ago and has slowly recovered from this.  He still has some expiratory wheezing related to this.  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  Denies claudication symptoms.  He denies rest pain.  Other medical problems include type 1 diabetes, CKD but most recent serum creatinine was normal, neuropathy, sleep apnea, all have been stable.  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  These previously healed.  He is currently on vancomycin IV through a PICC line.  He is also taking oral Augmentin.  Past Medical History:  Diagnosis Date  . Apnea, sleep   . CKD (chronic kidney disease)   . Diabetes mellitus without complication (HCC)   . GERD (gastroesophageal reflux disease)   . Hyperkalemia   . Neuropathy   . Obstructive sleep apnea on CPAP   . RA (rheumatoid arthritis) (HCC)   . Renal disorder    Past Surgical History:  Procedure Laterality Date  . ambutation toes    . AMPUTATION Left 09/10/2018   Procedure: AMPUTATION DIGIT LEFT 3RD TOE;  Surgeon: Erskine EmeryPatel, Prayashkumar, DPM;  Location: [facility];  Service: Podiatry;  Laterality: Left;  . CHOLECYSTECTOMY    . LEG AMPUTATION BELOW KNEE  03/2017    History reviewed. No pertinent family history.  SOCIAL HISTORY: Social History   Socioeconomic History  . Marital status: Married    Spouse name: Not on file  . Number of children: Not on file  . Years of education: Not on  file  . Highest education level: Not on file  Occupational History  . Not on file  Social Needs  . Financial resource strain: Patient refused  . Food insecurity:    Worry: Patient refused    Inability: Patient refused  . Transportation needs:    Medical: Patient refused    Non-medical: Patient refused  Tobacco Use  . Smoking status: Never Smoker  . Smokeless tobacco: Never Used  Substance and Sexual Activity  . Alcohol use: No  . Drug use: No  . Sexual activity: Not on file  Lifestyle  . Physical activity:    Days per week: Patient refused    Minutes per session: Patient refused  . Stress: Patient refused  Relationships  . Social connections:    Talks on phone: Patient refused    Gets together: Patient refused    Attends religious service: Patient refused    Active member of club or organization: Patient refused    Attends meetings of clubs or organizations: Patient refused    Relationship status: Patient refused  . Intimate partner violence:    Fear of current or ex partner: Patient refused    Emotionally abused: Patient refused    Physically abused: Patient refused    Forced sexual activity: Patient refused  Other Topics Concern  . Not on file  Social History Narrative  . Not on file    No Known Allergies  Current Outpatient Medications  Medication Sig Dispense Refill  . amoxicillin-clavulanate (AUGMENTIN) 875-125 MG tablet Take 1 tablet by mouth 2 (two) times daily for 28 days. 56 tablet 0  . aspirin EC 81 MG tablet Take 81 mg by mouth daily.    . carboxymethylcellulose (REFRESH PLUS) 0.5 % SOLN Apply 1 drop to eye 3 (three) times daily as needed (for chronic dry eye).     . Cholecalciferol 2000 units CAPS Take 2,000 Units by mouth daily.    Marland Kitchen erythromycin ophthalmic ointment Place 1 application into both eyes at bedtime. Apply a thin layer into both eyes    . Flaxseed, Linseed, (FLAX SEED OIL PO) Take 1,200 mg by mouth daily.    Marland Kitchen HUMALOG 100 UNIT/ML injection  80-85 Units by Pump Prime route See admin instructions. Approximately 80-85 units/24hrs via insulin pump    . leflunomide (ARAVA) 10 MG tablet Take 10 mg by mouth daily.    . methotrexate (RHEUMATREX) 2.5 MG tablet Take 10 mg by mouth every Sunday.     . Multiple Vitamin (MULTIVITAMIN WITH MINERALS) TABS tablet Take 1 tablet by mouth every morning.    Marland Kitchen omeprazole (PRILOSEC) 10 MG capsule Take 10 mg by mouth every morning.     Marland Kitchen OVER THE COUNTER MEDICATION Take 2,688 mcg by mouth daily. DE Dry Eye Omega 2688 mcg Supplement    . RESTASIS 0.05 % ophthalmic emulsion Place 1 drop into both eyes 2 (two) times daily.     . Vancomycin (VANCOCIN) 750-5 MG/150ML-% SOLN Inject 150 mLs (750 mg total) into the vein every 12 (twelve) hours for 14 days. 4000 mL 0  . vitamin E 1000 UNIT capsule Take 1,000 Units by mouth daily.     No current facility-administered medications for this visit.     ROS:   General:  No weight loss, Fever, chills  HEENT: No recent headaches, no nasal bleeding, no visual changes, no sore throat  Neurologic: No dizziness, blackouts, seizures. No recent symptoms of stroke or mini- stroke. No recent episodes of slurred speech, or temporary blindness.  Cardiac: No recent episodes of chest pain/pressure, no shortness of breath at rest.  + shortness of breath with exertion.  Denies history of atrial fibrillation or irregular heartbeat  Vascular: No history of rest pain in feet.  No history of claudication.  + history of non-healing ulcer, No history of DVT   Pulmonary: No home oxygen, no productive cough, no hemoptysis,  No asthma + wheezing  Musculoskeletal:  [ ]  Arthritis, [ ]  Low back pain,  [ ]  Joint pain  Hematologic:No history of hypercoagulable state.  No history of easy bleeding.  No history of anemia  Gastrointestinal: No hematochezia or melena,  No gastroesophageal reflux, no trouble swallowing  Urinary: [X]  chronic Kidney disease, [ ]  on HD - [ ]  MWF or [ ]  TTHS, [ ]   Burning with urination, [ ]  Frequent urination, [ ]  Difficulty urinating;   Skin: No rashes  Psychological: No history of anxiety,  No history of depression   Physical Examination  Vitals:   02/19/19 1037  BP: 108/66  Pulse: 85  Resp: 18  Temp: 98 F (36.7 C)  SpO2: 95%  Weight: 199 lb (90.3 kg)  Height: 5\' 7"  (1.702 m)    Body mass index is 31.17 kg/m.  General:  Alert and oriented, no acute distress HEENT: Normal Neck: No bruit or JVD Pulmonary: Bilateral expiratory wheeze Cardiac: Regular Rate and Rhythm without murmur Abdomen: Soft, non-tender, non-distended,  no mass Skin: No rash, 2.5 cm open wound medial aspect left first metatarsal head as depicted below Extremity Pulses:  2+ radial, brachial, femoral, absent left dorsalis pedis, posterior tibial pulses Musculoskeletal: No deformity or edema  Neurologic: Upper and lower extremity motor 5/5 and symmetric        DATA:  Patient had an MRI exam on February 06, 2019 which showed osteomyelitis of the first metatarsal sesamoid bones and proximal phalanx.  Patient had left ABIs performed 03/14/2019 which showed noncompressible vessels toe pressure of 83 left foot  ASSESSMENT: Nonhealing wound left foot with osteomyelitis of the metatarsal head.  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  PLAN: Abdominal aortogram with lower extremity runoff possible intervention scheduled for next week.  Risk benefits possible complications and procedure details were discussed with the patient including not limited to contrast nephropathy bleeding infection vessel injury.  He understands and agrees to proceed.  He also understands that this may require a possible bypass procedure that would be done at a different time.  He also understands that he is high risk for limb loss and certainly risk of amputation of the first toe as this does not currently seem to be clearing with IV antibiotics alone.  Fabienne Bruns, MD [facility] Office: [phone] Pager: [phone]

## 2019-02-24 DIAGNOSIS — E10621 Type 1 diabetes mellitus with foot ulcer: Secondary | ICD-10-CM | POA: Diagnosis not present

## 2019-02-25 ENCOUNTER — Ambulatory Visit (HOSPITAL_COMMUNITY)
Admission: RE | Admit: 2019-02-25 | Discharge: 2019-02-25 | Disposition: A | Discharge status: Home / Self Care | Payer: Medicare Other | Attending: Surgery | Admitting: Surgery

## 2019-02-25 ENCOUNTER — Other Ambulatory Visit: Payer: Self-pay

## 2019-02-25 ENCOUNTER — Ambulatory Visit (HOSPITAL_COMMUNITY)
Admission: RE | Disposition: A | Discharge status: Home / Self Care | Payer: Self-pay | Source: Home / Self Care | Attending: Surgery

## 2019-02-25 DIAGNOSIS — E1069 Type 1 diabetes mellitus with other specified complication: Secondary | ICD-10-CM | POA: Insufficient documentation

## 2019-02-25 DIAGNOSIS — Z79899 Other long term (current) drug therapy: Secondary | ICD-10-CM | POA: Diagnosis not present

## 2019-02-25 DIAGNOSIS — K219 Gastro-esophageal reflux disease without esophagitis: Secondary | ICD-10-CM | POA: Diagnosis not present

## 2019-02-25 DIAGNOSIS — E1022 Type 1 diabetes mellitus with diabetic chronic kidney disease: Secondary | ICD-10-CM | POA: Insufficient documentation

## 2019-02-25 DIAGNOSIS — E1051 Type 1 diabetes mellitus with diabetic peripheral angiopathy without gangrene: Secondary | ICD-10-CM | POA: Diagnosis not present

## 2019-02-25 DIAGNOSIS — Z89511 Acquired absence of right leg below knee: Secondary | ICD-10-CM | POA: Diagnosis not present

## 2019-02-25 DIAGNOSIS — M069 Rheumatoid arthritis, unspecified: Secondary | ICD-10-CM | POA: Diagnosis not present

## 2019-02-25 DIAGNOSIS — G4733 Obstructive sleep apnea (adult) (pediatric): Secondary | ICD-10-CM | POA: Insufficient documentation

## 2019-02-25 DIAGNOSIS — I70248 Atherosclerosis of native arteries of left leg with ulceration of other part of lower left leg: Secondary | ICD-10-CM

## 2019-02-25 DIAGNOSIS — E1040 Type 1 diabetes mellitus with diabetic neuropathy, unspecified: Secondary | ICD-10-CM | POA: Insufficient documentation

## 2019-02-25 DIAGNOSIS — Z7982 Long term (current) use of aspirin: Secondary | ICD-10-CM | POA: Diagnosis not present

## 2019-02-25 DIAGNOSIS — Z794 Long term (current) use of insulin: Secondary | ICD-10-CM | POA: Diagnosis not present

## 2019-02-25 DIAGNOSIS — M869 Osteomyelitis, unspecified: Secondary | ICD-10-CM | POA: Insufficient documentation

## 2019-02-25 DIAGNOSIS — N189 Chronic kidney disease, unspecified: Secondary | ICD-10-CM | POA: Diagnosis not present

## 2019-02-25 HISTORY — PX: ABDOMINAL AORTOGRAM: CATH118222

## 2019-02-25 HISTORY — PX: LOWER EXTREMITY ANGIOGRAPHY: CATH118251

## 2019-02-25 LAB — POCT I-STAT 4, (NA,K, GLUC, HGB,HCT)
Glucose, Bld: 114 mg/dL — ABNORMAL HIGH (ref 70–99)
HCT: 38 % — ABNORMAL LOW (ref 39.0–52.0)
Hemoglobin: 12.9 g/dL — ABNORMAL LOW (ref 13.0–17.0)
Potassium: 4.3 mmol/L (ref 3.5–5.1)
Sodium: 139 mmol/L (ref 135–145)

## 2019-02-25 LAB — POCT I-STAT CREATININE: Creatinine, Ser: 1.6 mg/dL — ABNORMAL HIGH (ref 0.61–1.24)

## 2019-02-25 LAB — GLUCOSE, CAPILLARY: Glucose-Capillary: 92 mg/dL (ref 70–99)

## 2019-02-25 SURGERY — LOWER EXTREMITY ANGIOGRAPHY
Anesthesia: LOCAL

## 2019-02-25 MED ORDER — HYDRALAZINE HCL 20 MG/ML IJ SOLN: 5.0000 mg | INTRAMUSCULAR | Status: DC | PRN

## 2019-02-25 MED ORDER — MIDAZOLAM HCL 2 MG/2ML IJ SOLN
INTRAMUSCULAR | Status: DC | PRN
  Administered 2019-02-25 (×2): 1 mg via INTRAVENOUS

## 2019-02-25 MED ORDER — SODIUM CHLORIDE 0.9 % IV SOLN: 250.0000 mL | INTRAVENOUS | Status: DC | PRN

## 2019-02-25 MED ORDER — HEPARIN (PORCINE) IN NACL 1000-0.9 UT/500ML-% IV SOLN
INTRAVENOUS | Status: DC | PRN
  Administered 2019-02-25 (×2): 500 mL

## 2019-02-25 MED ORDER — MIDAZOLAM HCL 2 MG/2ML IJ SOLN
INTRAMUSCULAR | Status: AC
  Filled 2019-02-25: qty 2

## 2019-02-25 MED ORDER — LABETALOL HCL 5 MG/ML IV SOLN: 10.0000 mg | INTRAVENOUS | Status: DC | PRN

## 2019-02-25 MED ORDER — FENTANYL CITRATE (PF) 100 MCG/2ML IJ SOLN
INTRAMUSCULAR | Status: AC
  Filled 2019-02-25: qty 2

## 2019-02-25 MED ORDER — HEPARIN (PORCINE) IN NACL 1000-0.9 UT/500ML-% IV SOLN
INTRAVENOUS | Status: AC
  Filled 2019-02-25: qty 1000

## 2019-02-25 MED ORDER — SODIUM CHLORIDE 0.9% FLUSH: 3.0000 mL | Freq: Two times a day (BID) | INTRAVENOUS | Status: DC

## 2019-02-25 MED ORDER — LIDOCAINE HCL (PF) 1 % IJ SOLN
INTRAMUSCULAR | Status: AC
  Filled 2019-02-25: qty 30

## 2019-02-25 MED ORDER — ACETAMINOPHEN 325 MG PO TABS: 650.0000 mg | ORAL_TABLET | ORAL | Status: DC | PRN

## 2019-02-25 MED ORDER — IODIXANOL 320 MG/ML IV SOLN
INTRAVENOUS | Status: DC | PRN
  Administered 2019-02-25: 11:00:00 33 mL via INTRAVENOUS

## 2019-02-25 MED ORDER — SODIUM CHLORIDE 0.9% FLUSH: 3.0000 mL | INTRAVENOUS | Status: DC | PRN

## 2019-02-25 MED ORDER — SODIUM CHLORIDE 0.9 % WEIGHT BASED INFUSION: 1.0000 mL/kg/h | INTRAVENOUS | Status: DC

## 2019-02-25 MED ORDER — SODIUM CHLORIDE 0.9 % IV SOLN
INTRAVENOUS | Status: DC
  Administered 2019-02-25: 08:00:00 via INTRAVENOUS

## 2019-02-25 MED ORDER — ONDANSETRON HCL 4 MG/2ML IJ SOLN: 4.0000 mg | Freq: Four times a day (QID) | INTRAMUSCULAR | Status: DC | PRN

## 2019-02-25 MED ORDER — FENTANYL CITRATE (PF) 100 MCG/2ML IJ SOLN
INTRAMUSCULAR | Status: DC | PRN
  Administered 2019-02-25 (×2): 25 ug via INTRAVENOUS

## 2019-02-25 MED ORDER — MORPHINE SULFATE (PF) 2 MG/ML IV SOLN: 2.0000 mg | INTRAVENOUS | Status: DC | PRN

## 2019-02-25 SURGICAL SUPPLY — 15 items
CATH OMNI FLUSH 5F 65CM (CATHETERS) ×4 IMPLANT
CLOSURE MYNX CONTROL 5F (Vascular Products) ×4 IMPLANT
DEVICE TORQUE H2O (MISCELLANEOUS) ×4 IMPLANT
FILTER CO2 0.2 MICRON (VASCULAR PRODUCTS) ×4 IMPLANT
GUIDEWIRE ANGLED .035X150CM (WIRE) ×4 IMPLANT
KIT MICROPUNCTURE NIT STIFF (SHEATH) ×4 IMPLANT
KIT PV (KITS) ×4 IMPLANT
RESERVOIR CO2 (VASCULAR PRODUCTS) ×4 IMPLANT
SET FLUSH CO2 (MISCELLANEOUS) ×4 IMPLANT
SHEATH PINNACLE 5F 10CM (SHEATH) ×4 IMPLANT
SHEATH PROBE COVER 6X72 (BAG) ×4 IMPLANT
SYR MEDRAD MARK V 150ML (SYRINGE) ×4 IMPLANT
TRANSDUCER W/STOPCOCK (MISCELLANEOUS) ×4 IMPLANT
TRAY PV CATH (CUSTOM PROCEDURE TRAY) ×4 IMPLANT
WIRE BENTSON .035X145CM (WIRE) ×4 IMPLANT

## 2019-02-25 NOTE — Progress Notes (Signed)
No bleeding or hematoma noted after ambulation 

## 2019-02-25 NOTE — Op Note (Signed)
    Patient name: Andrew Schultz MRN: [id number] DOB: June 05, 1946 Sex: male  02/25/2019 Pre-operative Diagnosis: Left leg ulcer Post-operative diagnosis:  Same Surgeon:  [name] Procedure Performed:  1.  Ultrasound-guided access, right femoral artery  2.  Abdominal aortogram with CO2  3.  Second-order catheterization  4.  Left lower extremity runoff  5.  Closure device (Mynx)  6.  Conscious sedation, 29 minutes     Indications: The patient has a history of a right below-knee amputation.  He now has osteomyelitis and a wound on his left leg and comes in for angiographic evaluation.  Procedure:  The patient was identified in the holding area and taken to room 8.  The patient was then placed supine on the table and prepped and draped in the usual sterile fashion.  A time out was called.  Conscious sedation was administered with the use of IV fentanyl and Versed under continuous physician and nurse monitoring.  Heart rate, blood pressure, and oxygen saturation were continuously monitored.  Total sedation time was .  Ultrasound was used to evaluate the right common femoral artery.  It was patent .  A digital ultrasound image was acquired.  A micropuncture needle was used to access the right common femoral artery under ultrasound guidance.  An 018 wire was advanced without resistance and a micropuncture sheath was placed.  The 018 wire was removed and a benson wire was placed.  The micropuncture sheath was exchanged for a 5 french sheath.  An omniflush catheter was advanced over the wire to the level of L-1.  An abdominal angiogram was obtained.  Next, using the omniflush catheter and a benson wire, the aortic bifurcation was crossed and the catheter was placed into theleft external iliac artery and left runoff was obtained.    Findings:   Aortogram: No significant renal artery stenosis was identified.  The infrarenal abdominal aorta is widely patent.  Bilateral common and external  iliac arteries are widely patent  Right Lower Extremity: Not evaluated, history of BKA  Left Lower Extremity: The left common femoral profundofemoral and superficial femoral artery are all widely patent.  The popliteal artery is patent throughout its course.  There is three-vessel runoff.  The dominant runoff is anterior tibial artery.  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  Intervention: None, a 5 [place] minx device was used to close the right groin cannulation site  Impression:  #1  No significant aortoiliac stenosis  #2  No significant left lower extremity stenosis noted.  There is three-vessel runoff.  There is evidence of vascular disease out onto the foot, which is not reconstructable  #3  A total of 27cc of contrast was utilized    V. [name], M.D., [facility] [place] Office: [phone] Pager:  [phone]

## 2019-02-25 NOTE — Discharge Instructions (Signed)
Femoral Site Care °This sheet gives you information about how to care for yourself after your procedure. Your health care provider may also give you more specific instructions. If you have problems or questions, contact your health care provider. °What can I expect after the procedure? °After the procedure, it is common to have: °· Bruising that usually fades within 1-2 weeks. °· Tenderness at the site. °Follow these instructions at home: °Wound care °· Follow instructions from your health care provider about how to take care of your insertion site. Make sure you: °? Wash your hands with soap and water before you change your bandage (dressing). If soap and water are not available, use hand sanitizer. °? Change your dressing as told by your health care provider. °? Leave stitches (sutures), skin glue, or adhesive strips in place. These skin closures may need to stay in place for 2 weeks or longer. If adhesive strip edges start to loosen and curl up, you may trim the loose edges. Do not remove adhesive strips completely unless your health care provider tells you to do that. °· Do not take baths, swim, or use a hot tub until your health care provider approves. °· You may shower 24-48 hours after the procedure or as told by your health care provider. °? Gently wash the site with plain soap and water. °? Pat the area dry with a clean towel. °? Do not rub the site. This may cause bleeding. °· Do not apply powder or lotion to the site. Keep the site clean and dry. °· Check your femoral site every day for signs of infection. Check for: °? Redness, swelling, or pain. °? Fluid or blood. °? Warmth. °? Pus or a bad smell. °Activity °· For the first 2-3 days after your procedure, or as long as directed: °? Avoid climbing stairs as much as possible. °? Do not squat. °· Do not lift anything that is heavier than 10 lb (4.5 kg), or the limit that you are told, until your health care provider says that it is safe. °· Rest as  directed. °? Avoid sitting for a long time without moving. Get up to take short walks every 1-2 hours. °· Do not drive for 24 hours if you were given a medicine to help you relax (sedative). °General instructions °· Take over-the-counter and prescription medicines only as told by your health care provider. °· Keep all follow-up visits as told by your health care provider. This is important. °Contact a health care provider if you have: °· A fever or chills. °· You have redness, swelling, or pain around your insertion site. °Get help right away if: °· The catheter insertion area swells very fast. °· You pass out. °· You suddenly start to sweat or your skin gets clammy. °· The catheter insertion area is bleeding, and the bleeding does not stop when you hold steady pressure on the area. °· The area near or just beyond the catheter insertion site becomes pale, cool, tingly, or numb. °These symptoms may represent a serious problem that is an emergency. Do not wait to see if the symptoms will go away. Get medical help right away. Call your local emergency services (911 in the [place] Do not drive yourself to the hospital. °Summary °· After the procedure, it is common to have bruising that usually fades within 1-2 weeks. °· Check your femoral site every day for signs of infection. °· Do not lift anything that is heavier than 10 lb (4.5 kg), or the   limit that you are told, until your health care provider says that it is safe. °This information is not intended to replace advice given to you by your health care provider. Make sure you discuss any questions you have with your health care provider. °Document Released: 07/16/2014 Document Revised: 11/25/2017 Document Reviewed: 11/25/2017 °Elsevier Interactive Patient Education © 2019 [employer] ° °

## 2019-02-25 NOTE — Progress Notes (Signed)
Discharge instructions provided to wife over telephone.  All questions answered

## 2019-02-25 NOTE — Interval H&P Note (Signed)
History and Physical Interval Note:  02/25/2019 10:15 AM  Andrew Schultz  has presented today for surgery, with the diagnosis of Peripherial Vascular disease.  The various methods of treatment have been discussed with the patient and family. After consideration of risks, benefits and other options for treatment, the patient has consented to  Procedure(s): ABDOMINAL AORTOGRAM W/LOWER EXTREMITY (Bilateral) as a surgical intervention.  The patient's history has been reviewed, patient examined, no change in status, stable for surgery.  I have reviewed the patient's chart and labs.  Questions were answered to the patient's satisfaction.     [name]

## 2019-02-26 ENCOUNTER — Encounter (HOSPITAL_COMMUNITY): Payer: Self-pay | Admitting: Surgery

## 2019-02-26 MED FILL — Lidocaine HCl Local Preservative Free (PF) Inj 1%: INTRAMUSCULAR | Qty: 30 | Status: AC

## 2019-03-03 ENCOUNTER — Encounter (HOSPITAL_BASED_OUTPATIENT_CLINIC_OR_DEPARTMENT_OTHER): Payer: Medicare Other | Attending: Internal Medicine

## 2019-03-03 DIAGNOSIS — E1040 Type 1 diabetes mellitus with diabetic neuropathy, unspecified: Secondary | ICD-10-CM | POA: Insufficient documentation

## 2019-03-03 DIAGNOSIS — Z89511 Acquired absence of right leg below knee: Secondary | ICD-10-CM | POA: Insufficient documentation

## 2019-03-03 DIAGNOSIS — E10610 Type 1 diabetes mellitus with diabetic neuropathic arthropathy: Secondary | ICD-10-CM | POA: Diagnosis not present

## 2019-03-03 DIAGNOSIS — G4730 Sleep apnea, unspecified: Secondary | ICD-10-CM | POA: Insufficient documentation

## 2019-03-03 DIAGNOSIS — M86672 Other chronic osteomyelitis, left ankle and foot: Secondary | ICD-10-CM | POA: Insufficient documentation

## 2019-03-03 DIAGNOSIS — E1069 Type 1 diabetes mellitus with other specified complication: Secondary | ICD-10-CM | POA: Insufficient documentation

## 2019-03-03 DIAGNOSIS — L97522 Non-pressure chronic ulcer of other part of left foot with fat layer exposed: Secondary | ICD-10-CM | POA: Insufficient documentation

## 2019-03-03 DIAGNOSIS — M069 Rheumatoid arthritis, unspecified: Secondary | ICD-10-CM | POA: Insufficient documentation

## 2019-03-03 DIAGNOSIS — E10621 Type 1 diabetes mellitus with foot ulcer: Secondary | ICD-10-CM | POA: Insufficient documentation

## 2019-03-04 ENCOUNTER — Other Ambulatory Visit (HOSPITAL_COMMUNITY)
Admission: RE | Admit: 2019-03-04 | Discharge: 2019-03-04 | Disposition: A | Discharge status: Home / Self Care | Payer: Medicare Other | Source: Ambulatory Visit | Attending: Internal Medicine | Admitting: Internal Medicine

## 2019-03-04 ENCOUNTER — Other Ambulatory Visit: Payer: Self-pay

## 2019-03-04 DIAGNOSIS — E10621 Type 1 diabetes mellitus with foot ulcer: Secondary | ICD-10-CM | POA: Insufficient documentation

## 2019-03-04 LAB — HEMOGLOBIN A1C
Hgb A1c MFr Bld: 8.7 % — ABNORMAL HIGH (ref 4.8–5.6)
Mean Plasma Glucose: 202.99 mg/dL

## 2019-03-06 ENCOUNTER — Other Ambulatory Visit: Payer: Self-pay

## 2019-03-06 DIAGNOSIS — E1069 Type 1 diabetes mellitus with other specified complication: Secondary | ICD-10-CM | POA: Diagnosis not present

## 2019-03-06 LAB — GLUCOSE, CAPILLARY
Glucose-Capillary: 204 mg/dL — ABNORMAL HIGH (ref 70–99)
Glucose-Capillary: 222 mg/dL — ABNORMAL HIGH (ref 70–99)

## 2019-03-09 DIAGNOSIS — E1069 Type 1 diabetes mellitus with other specified complication: Secondary | ICD-10-CM | POA: Diagnosis not present

## 2019-03-09 LAB — GLUCOSE, CAPILLARY
Glucose-Capillary: 246 mg/dL — ABNORMAL HIGH (ref 70–99)
Glucose-Capillary: 217 mg/dL — ABNORMAL HIGH (ref 70–99)

## 2019-03-10 DIAGNOSIS — E1069 Type 1 diabetes mellitus with other specified complication: Secondary | ICD-10-CM | POA: Diagnosis not present

## 2019-03-10 LAB — GLUCOSE, CAPILLARY
Glucose-Capillary: 200 mg/dL — ABNORMAL HIGH (ref 70–99)
Glucose-Capillary: 186 mg/dL — ABNORMAL HIGH (ref 70–99)

## 2019-03-11 DIAGNOSIS — E1069 Type 1 diabetes mellitus with other specified complication: Secondary | ICD-10-CM | POA: Diagnosis not present

## 2019-03-11 LAB — GLUCOSE, CAPILLARY
Glucose-Capillary: 203 mg/dL — ABNORMAL HIGH (ref 70–99)
Glucose-Capillary: 193 mg/dL — ABNORMAL HIGH (ref 70–99)

## 2019-03-12 DIAGNOSIS — E1069 Type 1 diabetes mellitus with other specified complication: Secondary | ICD-10-CM | POA: Diagnosis not present

## 2019-03-12 LAB — GLUCOSE, CAPILLARY
Glucose-Capillary: 235 mg/dL — ABNORMAL HIGH (ref 70–99)
Glucose-Capillary: 165 mg/dL — ABNORMAL HIGH (ref 70–99)

## 2019-03-13 DIAGNOSIS — E1069 Type 1 diabetes mellitus with other specified complication: Secondary | ICD-10-CM | POA: Diagnosis not present

## 2019-03-13 LAB — GLUCOSE, CAPILLARY
Glucose-Capillary: 250 mg/dL — ABNORMAL HIGH (ref 70–99)
Glucose-Capillary: 234 mg/dL — ABNORMAL HIGH (ref 70–99)

## 2019-03-16 DIAGNOSIS — E1069 Type 1 diabetes mellitus with other specified complication: Secondary | ICD-10-CM | POA: Diagnosis not present

## 2019-03-16 LAB — GLUCOSE, CAPILLARY
Glucose-Capillary: 216 mg/dL — ABNORMAL HIGH (ref 70–99)
Glucose-Capillary: 130 mg/dL — ABNORMAL HIGH (ref 70–99)

## 2019-03-17 DIAGNOSIS — E1069 Type 1 diabetes mellitus with other specified complication: Secondary | ICD-10-CM | POA: Diagnosis not present

## 2019-03-17 LAB — GLUCOSE, CAPILLARY
Glucose-Capillary: 205 mg/dL — ABNORMAL HIGH (ref 70–99)
Glucose-Capillary: 150 mg/dL — ABNORMAL HIGH (ref 70–99)

## 2019-03-18 DIAGNOSIS — E1069 Type 1 diabetes mellitus with other specified complication: Secondary | ICD-10-CM | POA: Diagnosis not present

## 2019-03-18 LAB — GLUCOSE, CAPILLARY
Glucose-Capillary: 243 mg/dL — ABNORMAL HIGH (ref 70–99)
Glucose-Capillary: 250 mg/dL — ABNORMAL HIGH (ref 70–99)

## 2019-03-19 DIAGNOSIS — E1069 Type 1 diabetes mellitus with other specified complication: Secondary | ICD-10-CM | POA: Diagnosis not present

## 2019-03-19 LAB — GLUCOSE, CAPILLARY
Glucose-Capillary: 196 mg/dL — ABNORMAL HIGH (ref 70–99)
Glucose-Capillary: 142 mg/dL — ABNORMAL HIGH (ref 70–99)

## 2019-03-20 DIAGNOSIS — E1069 Type 1 diabetes mellitus with other specified complication: Secondary | ICD-10-CM | POA: Diagnosis not present

## 2019-03-20 LAB — GLUCOSE, CAPILLARY
Glucose-Capillary: 149 mg/dL — ABNORMAL HIGH (ref 70–99)
Glucose-Capillary: 170 mg/dL — ABNORMAL HIGH (ref 70–99)

## 2019-03-23 DIAGNOSIS — E1069 Type 1 diabetes mellitus with other specified complication: Secondary | ICD-10-CM | POA: Diagnosis not present

## 2019-03-23 LAB — GLUCOSE, CAPILLARY
Glucose-Capillary: 223 mg/dL — ABNORMAL HIGH (ref 70–99)
Glucose-Capillary: 166 mg/dL — ABNORMAL HIGH (ref 70–99)

## 2019-03-24 DIAGNOSIS — E1069 Type 1 diabetes mellitus with other specified complication: Secondary | ICD-10-CM | POA: Diagnosis not present

## 2019-03-24 LAB — GLUCOSE, CAPILLARY
Glucose-Capillary: 183 mg/dL — ABNORMAL HIGH (ref 70–99)
Glucose-Capillary: 109 mg/dL — ABNORMAL HIGH (ref 70–99)

## 2019-03-25 DIAGNOSIS — E1069 Type 1 diabetes mellitus with other specified complication: Secondary | ICD-10-CM | POA: Diagnosis not present

## 2019-03-25 LAB — GLUCOSE, CAPILLARY
Glucose-Capillary: 235 mg/dL — ABNORMAL HIGH (ref 70–99)
Glucose-Capillary: 196 mg/dL — ABNORMAL HIGH (ref 70–99)

## 2019-03-26 DIAGNOSIS — E1069 Type 1 diabetes mellitus with other specified complication: Secondary | ICD-10-CM | POA: Diagnosis not present

## 2019-03-26 LAB — GLUCOSE, CAPILLARY
Glucose-Capillary: 192 mg/dL — ABNORMAL HIGH (ref 70–99)
Glucose-Capillary: 169 mg/dL — ABNORMAL HIGH (ref 70–99)

## 2019-03-27 ENCOUNTER — Encounter (HOSPITAL_BASED_OUTPATIENT_CLINIC_OR_DEPARTMENT_OTHER): Payer: Medicare Other | Attending: Internal Medicine

## 2019-03-27 DIAGNOSIS — M069 Rheumatoid arthritis, unspecified: Secondary | ICD-10-CM | POA: Diagnosis not present

## 2019-03-27 DIAGNOSIS — L97524 Non-pressure chronic ulcer of other part of left foot with necrosis of bone: Secondary | ICD-10-CM | POA: Diagnosis not present

## 2019-03-27 DIAGNOSIS — Z89511 Acquired absence of right leg below knee: Secondary | ICD-10-CM | POA: Diagnosis not present

## 2019-03-27 DIAGNOSIS — G4730 Sleep apnea, unspecified: Secondary | ICD-10-CM | POA: Insufficient documentation

## 2019-03-27 DIAGNOSIS — Z794 Long term (current) use of insulin: Secondary | ICD-10-CM | POA: Insufficient documentation

## 2019-03-27 DIAGNOSIS — E1040 Type 1 diabetes mellitus with diabetic neuropathy, unspecified: Secondary | ICD-10-CM | POA: Insufficient documentation

## 2019-03-27 DIAGNOSIS — E10610 Type 1 diabetes mellitus with diabetic neuropathic arthropathy: Secondary | ICD-10-CM | POA: Insufficient documentation

## 2019-03-27 DIAGNOSIS — E1065 Type 1 diabetes mellitus with hyperglycemia: Secondary | ICD-10-CM | POA: Diagnosis not present

## 2019-03-27 DIAGNOSIS — E10649 Type 1 diabetes mellitus with hypoglycemia without coma: Secondary | ICD-10-CM | POA: Diagnosis not present

## 2019-03-27 DIAGNOSIS — E1069 Type 1 diabetes mellitus with other specified complication: Secondary | ICD-10-CM | POA: Insufficient documentation

## 2019-03-27 DIAGNOSIS — M86672 Other chronic osteomyelitis, left ankle and foot: Secondary | ICD-10-CM | POA: Insufficient documentation

## 2019-03-27 LAB — GLUCOSE, CAPILLARY
Glucose-Capillary: 218 mg/dL — ABNORMAL HIGH (ref 70–99)
Glucose-Capillary: 165 mg/dL — ABNORMAL HIGH (ref 70–99)

## 2019-03-31 DIAGNOSIS — E1069 Type 1 diabetes mellitus with other specified complication: Secondary | ICD-10-CM | POA: Diagnosis not present

## 2019-03-31 LAB — GLUCOSE, CAPILLARY
Glucose-Capillary: 140 mg/dL — ABNORMAL HIGH (ref 70–99)
Glucose-Capillary: 97 mg/dL (ref 70–99)

## 2019-04-01 DIAGNOSIS — E1069 Type 1 diabetes mellitus with other specified complication: Secondary | ICD-10-CM | POA: Diagnosis not present

## 2019-04-01 LAB — GLUCOSE, CAPILLARY
Glucose-Capillary: 187 mg/dL — ABNORMAL HIGH (ref 70–99)
Glucose-Capillary: 129 mg/dL — ABNORMAL HIGH (ref 70–99)

## 2019-04-02 DIAGNOSIS — E1069 Type 1 diabetes mellitus with other specified complication: Secondary | ICD-10-CM | POA: Diagnosis not present

## 2019-04-02 LAB — GLUCOSE, CAPILLARY
Glucose-Capillary: 256 mg/dL — ABNORMAL HIGH (ref 70–99)
Glucose-Capillary: 192 mg/dL — ABNORMAL HIGH (ref 70–99)

## 2019-04-03 DIAGNOSIS — E1069 Type 1 diabetes mellitus with other specified complication: Secondary | ICD-10-CM | POA: Diagnosis not present

## 2019-04-03 LAB — GLUCOSE, CAPILLARY
Glucose-Capillary: 209 mg/dL — ABNORMAL HIGH (ref 70–99)
Glucose-Capillary: 161 mg/dL — ABNORMAL HIGH (ref 70–99)

## 2019-04-06 DIAGNOSIS — E1069 Type 1 diabetes mellitus with other specified complication: Secondary | ICD-10-CM | POA: Diagnosis not present

## 2019-04-07 ENCOUNTER — Other Ambulatory Visit: Payer: Self-pay

## 2019-04-07 DIAGNOSIS — E1069 Type 1 diabetes mellitus with other specified complication: Secondary | ICD-10-CM | POA: Diagnosis not present

## 2019-04-07 LAB — GLUCOSE, CAPILLARY
Glucose-Capillary: 212 mg/dL — ABNORMAL HIGH (ref 70–99)
Glucose-Capillary: 235 mg/dL — ABNORMAL HIGH (ref 70–99)
Glucose-Capillary: 178 mg/dL — ABNORMAL HIGH (ref 70–99)
Glucose-Capillary: 208 mg/dL — ABNORMAL HIGH (ref 70–99)

## 2019-04-09 DIAGNOSIS — E1069 Type 1 diabetes mellitus with other specified complication: Secondary | ICD-10-CM | POA: Diagnosis not present

## 2019-04-09 LAB — GLUCOSE, CAPILLARY
Glucose-Capillary: 474 mg/dL — ABNORMAL HIGH (ref 70–99)
Glucose-Capillary: 415 mg/dL — ABNORMAL HIGH (ref 70–99)

## 2019-04-10 DIAGNOSIS — E1069 Type 1 diabetes mellitus with other specified complication: Secondary | ICD-10-CM | POA: Diagnosis not present

## 2019-04-10 LAB — GLUCOSE, CAPILLARY
Glucose-Capillary: 253 mg/dL — ABNORMAL HIGH (ref 70–99)
Glucose-Capillary: 197 mg/dL — ABNORMAL HIGH (ref 70–99)

## 2019-04-13 DIAGNOSIS — E1069 Type 1 diabetes mellitus with other specified complication: Secondary | ICD-10-CM | POA: Diagnosis not present

## 2019-04-13 LAB — GLUCOSE, CAPILLARY
Glucose-Capillary: 242 mg/dL — ABNORMAL HIGH (ref 70–99)
Glucose-Capillary: 233 mg/dL — ABNORMAL HIGH (ref 70–99)

## 2019-04-14 DIAGNOSIS — E1069 Type 1 diabetes mellitus with other specified complication: Secondary | ICD-10-CM | POA: Diagnosis not present

## 2019-04-14 LAB — GLUCOSE, CAPILLARY
Glucose-Capillary: 282 mg/dL — ABNORMAL HIGH (ref 70–99)
Glucose-Capillary: 232 mg/dL — ABNORMAL HIGH (ref 70–99)

## 2019-04-15 DIAGNOSIS — E1069 Type 1 diabetes mellitus with other specified complication: Secondary | ICD-10-CM | POA: Diagnosis not present

## 2019-04-15 LAB — GLUCOSE, CAPILLARY
Glucose-Capillary: 209 mg/dL — ABNORMAL HIGH (ref 70–99)
Glucose-Capillary: 217 mg/dL — ABNORMAL HIGH (ref 70–99)

## 2019-04-17 DIAGNOSIS — E1069 Type 1 diabetes mellitus with other specified complication: Secondary | ICD-10-CM | POA: Diagnosis not present

## 2019-04-17 LAB — GLUCOSE, CAPILLARY
Glucose-Capillary: 142 mg/dL — ABNORMAL HIGH (ref 70–99)
Glucose-Capillary: 129 mg/dL — ABNORMAL HIGH (ref 70–99)

## 2019-04-21 DIAGNOSIS — E1069 Type 1 diabetes mellitus with other specified complication: Secondary | ICD-10-CM | POA: Diagnosis not present

## 2019-04-21 LAB — GLUCOSE, CAPILLARY
Glucose-Capillary: 508 mg/dL (ref 70–99)
Glucose-Capillary: 386 mg/dL — ABNORMAL HIGH (ref 70–99)

## 2019-04-23 DIAGNOSIS — E1069 Type 1 diabetes mellitus with other specified complication: Secondary | ICD-10-CM | POA: Diagnosis not present

## 2019-04-23 LAB — GLUCOSE, CAPILLARY
Glucose-Capillary: 153 mg/dL — ABNORMAL HIGH (ref 70–99)
Glucose-Capillary: 109 mg/dL — ABNORMAL HIGH (ref 70–99)

## 2019-04-24 DIAGNOSIS — E1069 Type 1 diabetes mellitus with other specified complication: Secondary | ICD-10-CM | POA: Diagnosis not present

## 2019-04-24 LAB — GLUCOSE, CAPILLARY
Glucose-Capillary: 132 mg/dL — ABNORMAL HIGH (ref 70–99)
Glucose-Capillary: 49 mg/dL — ABNORMAL LOW (ref 70–99)
Glucose-Capillary: 82 mg/dL (ref 70–99)
Glucose-Capillary: 142 mg/dL — ABNORMAL HIGH (ref 70–99)

## 2019-04-27 ENCOUNTER — Encounter (HOSPITAL_BASED_OUTPATIENT_CLINIC_OR_DEPARTMENT_OTHER): Payer: Medicare Other | Attending: Internal Medicine

## 2019-04-27 DIAGNOSIS — M86672 Other chronic osteomyelitis, left ankle and foot: Secondary | ICD-10-CM | POA: Diagnosis not present

## 2019-04-27 DIAGNOSIS — M069 Rheumatoid arthritis, unspecified: Secondary | ICD-10-CM | POA: Diagnosis not present

## 2019-04-27 DIAGNOSIS — E1040 Type 1 diabetes mellitus with diabetic neuropathy, unspecified: Secondary | ICD-10-CM | POA: Insufficient documentation

## 2019-04-27 DIAGNOSIS — L97522 Non-pressure chronic ulcer of other part of left foot with fat layer exposed: Secondary | ICD-10-CM | POA: Diagnosis not present

## 2019-04-27 DIAGNOSIS — Z89511 Acquired absence of right leg below knee: Secondary | ICD-10-CM | POA: Diagnosis not present

## 2019-04-27 DIAGNOSIS — E10610 Type 1 diabetes mellitus with diabetic neuropathic arthropathy: Secondary | ICD-10-CM | POA: Insufficient documentation

## 2019-04-27 DIAGNOSIS — E1069 Type 1 diabetes mellitus with other specified complication: Secondary | ICD-10-CM | POA: Diagnosis not present

## 2019-04-27 DIAGNOSIS — E10621 Type 1 diabetes mellitus with foot ulcer: Secondary | ICD-10-CM | POA: Insufficient documentation

## 2019-04-27 LAB — GLUCOSE, CAPILLARY
Glucose-Capillary: 153 mg/dL — ABNORMAL HIGH (ref 70–99)
Glucose-Capillary: 185 mg/dL — ABNORMAL HIGH (ref 70–99)

## 2019-04-28 DIAGNOSIS — E1069 Type 1 diabetes mellitus with other specified complication: Secondary | ICD-10-CM | POA: Diagnosis not present

## 2019-04-28 LAB — GLUCOSE, CAPILLARY
Glucose-Capillary: 218 mg/dL — ABNORMAL HIGH (ref 70–99)
Glucose-Capillary: 205 mg/dL — ABNORMAL HIGH (ref 70–99)

## 2019-04-29 DIAGNOSIS — E1069 Type 1 diabetes mellitus with other specified complication: Secondary | ICD-10-CM | POA: Diagnosis not present

## 2019-04-29 LAB — GLUCOSE, CAPILLARY
Glucose-Capillary: 117 mg/dL — ABNORMAL HIGH (ref 70–99)
Glucose-Capillary: 154 mg/dL — ABNORMAL HIGH (ref 70–99)
Glucose-Capillary: 219 mg/dL — ABNORMAL HIGH (ref 70–99)

## 2019-04-30 DIAGNOSIS — E1069 Type 1 diabetes mellitus with other specified complication: Secondary | ICD-10-CM | POA: Diagnosis not present

## 2019-04-30 LAB — GLUCOSE, CAPILLARY
Glucose-Capillary: 149 mg/dL — ABNORMAL HIGH (ref 70–99)
Glucose-Capillary: 149 mg/dL — ABNORMAL HIGH (ref 70–99)

## 2019-05-01 DIAGNOSIS — E1069 Type 1 diabetes mellitus with other specified complication: Secondary | ICD-10-CM | POA: Diagnosis not present

## 2019-05-01 LAB — GLUCOSE, CAPILLARY
Glucose-Capillary: 137 mg/dL — ABNORMAL HIGH (ref 70–99)
Glucose-Capillary: 144 mg/dL — ABNORMAL HIGH (ref 70–99)
Glucose-Capillary: 244 mg/dL — ABNORMAL HIGH (ref 70–99)

## 2019-05-04 DIAGNOSIS — E1069 Type 1 diabetes mellitus with other specified complication: Secondary | ICD-10-CM | POA: Diagnosis not present

## 2019-05-04 LAB — GLUCOSE, CAPILLARY
Glucose-Capillary: 205 mg/dL — ABNORMAL HIGH (ref 70–99)
Glucose-Capillary: 288 mg/dL — ABNORMAL HIGH (ref 70–99)

## 2019-05-05 DIAGNOSIS — E1069 Type 1 diabetes mellitus with other specified complication: Secondary | ICD-10-CM | POA: Diagnosis not present

## 2019-05-05 LAB — GLUCOSE, CAPILLARY
Glucose-Capillary: 244 mg/dL — ABNORMAL HIGH (ref 70–99)
Glucose-Capillary: 265 mg/dL — ABNORMAL HIGH (ref 70–99)

## 2019-05-07 DIAGNOSIS — E1069 Type 1 diabetes mellitus with other specified complication: Secondary | ICD-10-CM | POA: Diagnosis not present

## 2019-05-07 LAB — GLUCOSE, CAPILLARY
Glucose-Capillary: 179 mg/dL — ABNORMAL HIGH (ref 70–99)
Glucose-Capillary: 126 mg/dL — ABNORMAL HIGH (ref 70–99)

## 2019-05-08 ENCOUNTER — Other Ambulatory Visit: Payer: Self-pay

## 2019-05-08 DIAGNOSIS — E1069 Type 1 diabetes mellitus with other specified complication: Secondary | ICD-10-CM | POA: Diagnosis not present

## 2019-05-08 LAB — GLUCOSE, CAPILLARY
Glucose-Capillary: 131 mg/dL — ABNORMAL HIGH (ref 70–99)
Glucose-Capillary: 92 mg/dL (ref 70–99)
Glucose-Capillary: 188 mg/dL — ABNORMAL HIGH (ref 70–99)

## 2019-05-11 DIAGNOSIS — E1069 Type 1 diabetes mellitus with other specified complication: Secondary | ICD-10-CM | POA: Diagnosis not present

## 2019-05-11 LAB — GLUCOSE, CAPILLARY
Glucose-Capillary: 272 mg/dL — ABNORMAL HIGH (ref 70–99)
Glucose-Capillary: 187 mg/dL — ABNORMAL HIGH (ref 70–99)

## 2019-05-12 DIAGNOSIS — E1069 Type 1 diabetes mellitus with other specified complication: Secondary | ICD-10-CM | POA: Diagnosis not present

## 2019-05-12 LAB — GLUCOSE, CAPILLARY
Glucose-Capillary: 239 mg/dL — ABNORMAL HIGH (ref 70–99)
Glucose-Capillary: 194 mg/dL — ABNORMAL HIGH (ref 70–99)

## 2019-05-13 DIAGNOSIS — E1069 Type 1 diabetes mellitus with other specified complication: Secondary | ICD-10-CM | POA: Diagnosis not present

## 2019-05-13 LAB — GLUCOSE, CAPILLARY
Glucose-Capillary: 231 mg/dL — ABNORMAL HIGH (ref 70–99)
Glucose-Capillary: 134 mg/dL — ABNORMAL HIGH (ref 70–99)

## 2019-05-14 DIAGNOSIS — E1069 Type 1 diabetes mellitus with other specified complication: Secondary | ICD-10-CM | POA: Diagnosis not present

## 2019-05-14 LAB — GLUCOSE, CAPILLARY
Glucose-Capillary: 252 mg/dL — ABNORMAL HIGH (ref 70–99)
Glucose-Capillary: 212 mg/dL — ABNORMAL HIGH (ref 70–99)

## 2019-05-15 DIAGNOSIS — E1069 Type 1 diabetes mellitus with other specified complication: Secondary | ICD-10-CM | POA: Diagnosis not present

## 2019-05-15 LAB — GLUCOSE, CAPILLARY
Glucose-Capillary: 157 mg/dL — ABNORMAL HIGH (ref 70–99)
Glucose-Capillary: 75 mg/dL (ref 70–99)
Glucose-Capillary: 123 mg/dL — ABNORMAL HIGH (ref 70–99)

## 2019-05-18 DIAGNOSIS — E1069 Type 1 diabetes mellitus with other specified complication: Secondary | ICD-10-CM | POA: Diagnosis not present

## 2019-05-18 LAB — GLUCOSE, CAPILLARY
Glucose-Capillary: 231 mg/dL — ABNORMAL HIGH (ref 70–99)
Glucose-Capillary: 209 mg/dL — ABNORMAL HIGH (ref 70–99)

## 2019-05-19 DIAGNOSIS — E1069 Type 1 diabetes mellitus with other specified complication: Secondary | ICD-10-CM | POA: Diagnosis not present

## 2019-05-19 LAB — GLUCOSE, CAPILLARY
Glucose-Capillary: 332 mg/dL — ABNORMAL HIGH (ref 70–99)
Glucose-Capillary: 286 mg/dL — ABNORMAL HIGH (ref 70–99)

## 2019-05-20 DIAGNOSIS — E1069 Type 1 diabetes mellitus with other specified complication: Secondary | ICD-10-CM | POA: Diagnosis not present

## 2019-05-20 LAB — GLUCOSE, CAPILLARY
Glucose-Capillary: 260 mg/dL — ABNORMAL HIGH (ref 70–99)
Glucose-Capillary: 233 mg/dL — ABNORMAL HIGH (ref 70–99)

## 2019-05-21 DIAGNOSIS — E1069 Type 1 diabetes mellitus with other specified complication: Secondary | ICD-10-CM | POA: Diagnosis not present

## 2019-05-21 LAB — GLUCOSE, CAPILLARY
Glucose-Capillary: 233 mg/dL — ABNORMAL HIGH (ref 70–99)
Glucose-Capillary: 206 mg/dL — ABNORMAL HIGH (ref 70–99)

## 2019-05-22 DIAGNOSIS — E1069 Type 1 diabetes mellitus with other specified complication: Secondary | ICD-10-CM | POA: Diagnosis not present

## 2019-05-22 LAB — GLUCOSE, CAPILLARY
Glucose-Capillary: 187 mg/dL — ABNORMAL HIGH (ref 70–99)
Glucose-Capillary: 206 mg/dL — ABNORMAL HIGH (ref 70–99)

## 2019-05-25 DIAGNOSIS — E1069 Type 1 diabetes mellitus with other specified complication: Secondary | ICD-10-CM | POA: Diagnosis not present

## 2019-05-25 LAB — GLUCOSE, CAPILLARY
Glucose-Capillary: 240 mg/dL — ABNORMAL HIGH (ref 70–99)
Glucose-Capillary: 190 mg/dL — ABNORMAL HIGH (ref 70–99)

## 2019-05-26 DIAGNOSIS — E1069 Type 1 diabetes mellitus with other specified complication: Secondary | ICD-10-CM | POA: Diagnosis not present

## 2019-05-26 LAB — GLUCOSE, CAPILLARY
Glucose-Capillary: 200 mg/dL — ABNORMAL HIGH (ref 70–99)
Glucose-Capillary: 139 mg/dL — ABNORMAL HIGH (ref 70–99)

## 2019-05-27 ENCOUNTER — Encounter (HOSPITAL_BASED_OUTPATIENT_CLINIC_OR_DEPARTMENT_OTHER): Payer: Medicare Other | Attending: Physician Assistant

## 2019-05-27 DIAGNOSIS — Z89511 Acquired absence of right leg below knee: Secondary | ICD-10-CM | POA: Insufficient documentation

## 2019-05-27 DIAGNOSIS — M069 Rheumatoid arthritis, unspecified: Secondary | ICD-10-CM | POA: Insufficient documentation

## 2019-05-27 DIAGNOSIS — E10621 Type 1 diabetes mellitus with foot ulcer: Secondary | ICD-10-CM | POA: Diagnosis not present

## 2019-05-27 DIAGNOSIS — M86672 Other chronic osteomyelitis, left ankle and foot: Secondary | ICD-10-CM | POA: Diagnosis not present

## 2019-05-27 DIAGNOSIS — E1040 Type 1 diabetes mellitus with diabetic neuropathy, unspecified: Secondary | ICD-10-CM | POA: Insufficient documentation

## 2019-05-27 DIAGNOSIS — E1069 Type 1 diabetes mellitus with other specified complication: Secondary | ICD-10-CM | POA: Insufficient documentation

## 2019-05-27 DIAGNOSIS — G4730 Sleep apnea, unspecified: Secondary | ICD-10-CM | POA: Diagnosis not present

## 2019-05-27 DIAGNOSIS — L97522 Non-pressure chronic ulcer of other part of left foot with fat layer exposed: Secondary | ICD-10-CM | POA: Diagnosis not present

## 2019-05-27 DIAGNOSIS — E10610 Type 1 diabetes mellitus with diabetic neuropathic arthropathy: Secondary | ICD-10-CM | POA: Insufficient documentation

## 2019-05-27 LAB — GLUCOSE, CAPILLARY
Glucose-Capillary: 199 mg/dL — ABNORMAL HIGH (ref 70–99)
Glucose-Capillary: 186 mg/dL — ABNORMAL HIGH (ref 70–99)

## 2019-05-28 DIAGNOSIS — E1069 Type 1 diabetes mellitus with other specified complication: Secondary | ICD-10-CM | POA: Diagnosis not present

## 2019-05-28 LAB — GLUCOSE, CAPILLARY
Glucose-Capillary: 223 mg/dL — ABNORMAL HIGH (ref 70–99)
Glucose-Capillary: 194 mg/dL — ABNORMAL HIGH (ref 70–99)

## 2019-06-02 DIAGNOSIS — E1069 Type 1 diabetes mellitus with other specified complication: Secondary | ICD-10-CM | POA: Diagnosis not present

## 2019-06-02 LAB — GLUCOSE, CAPILLARY
Glucose-Capillary: 214 mg/dL — ABNORMAL HIGH (ref 70–99)
Glucose-Capillary: 233 mg/dL — ABNORMAL HIGH (ref 70–99)

## 2019-06-03 DIAGNOSIS — E1069 Type 1 diabetes mellitus with other specified complication: Secondary | ICD-10-CM | POA: Diagnosis not present

## 2019-06-03 LAB — GLUCOSE, CAPILLARY
Glucose-Capillary: 139 mg/dL — ABNORMAL HIGH (ref 70–99)
Glucose-Capillary: 102 mg/dL — ABNORMAL HIGH (ref 70–99)

## 2019-06-04 DIAGNOSIS — E1069 Type 1 diabetes mellitus with other specified complication: Secondary | ICD-10-CM | POA: Diagnosis not present

## 2019-06-04 LAB — GLUCOSE, CAPILLARY
Glucose-Capillary: 158 mg/dL — ABNORMAL HIGH (ref 70–99)
Glucose-Capillary: 201 mg/dL — ABNORMAL HIGH (ref 70–99)

## 2019-06-05 DIAGNOSIS — E1069 Type 1 diabetes mellitus with other specified complication: Secondary | ICD-10-CM | POA: Diagnosis not present

## 2019-06-05 LAB — GLUCOSE, CAPILLARY
Glucose-Capillary: 157 mg/dL — ABNORMAL HIGH (ref 70–99)
Glucose-Capillary: 175 mg/dL — ABNORMAL HIGH (ref 70–99)

## 2019-06-08 ENCOUNTER — Other Ambulatory Visit: Payer: Self-pay

## 2019-06-08 DIAGNOSIS — E1069 Type 1 diabetes mellitus with other specified complication: Secondary | ICD-10-CM | POA: Diagnosis not present

## 2019-06-08 LAB — GLUCOSE, CAPILLARY: Glucose-Capillary: 180 mg/dL — ABNORMAL HIGH (ref 70–99)

## 2019-06-09 DIAGNOSIS — E1069 Type 1 diabetes mellitus with other specified complication: Secondary | ICD-10-CM | POA: Diagnosis not present

## 2019-06-09 LAB — GLUCOSE, CAPILLARY
Glucose-Capillary: 233 mg/dL — ABNORMAL HIGH (ref 70–99)
Glucose-Capillary: 239 mg/dL — ABNORMAL HIGH (ref 70–99)

## 2019-06-11 DIAGNOSIS — E1069 Type 1 diabetes mellitus with other specified complication: Secondary | ICD-10-CM | POA: Diagnosis not present

## 2019-06-11 LAB — GLUCOSE, CAPILLARY
Glucose-Capillary: 180 mg/dL — ABNORMAL HIGH (ref 70–99)
Glucose-Capillary: 239 mg/dL — ABNORMAL HIGH (ref 70–99)

## 2019-06-12 DIAGNOSIS — E1069 Type 1 diabetes mellitus with other specified complication: Secondary | ICD-10-CM | POA: Diagnosis not present

## 2019-06-12 LAB — GLUCOSE, CAPILLARY
Glucose-Capillary: 204 mg/dL — ABNORMAL HIGH (ref 70–99)
Glucose-Capillary: 174 mg/dL — ABNORMAL HIGH (ref 70–99)

## 2019-06-15 DIAGNOSIS — E1069 Type 1 diabetes mellitus with other specified complication: Secondary | ICD-10-CM | POA: Diagnosis not present

## 2019-06-15 LAB — GLUCOSE, CAPILLARY
Glucose-Capillary: 138 mg/dL — ABNORMAL HIGH (ref 70–99)
Glucose-Capillary: 120 mg/dL — ABNORMAL HIGH (ref 70–99)

## 2019-06-16 DIAGNOSIS — E1069 Type 1 diabetes mellitus with other specified complication: Secondary | ICD-10-CM | POA: Diagnosis not present

## 2019-06-16 LAB — GLUCOSE, CAPILLARY
Glucose-Capillary: 221 mg/dL — ABNORMAL HIGH (ref 70–99)
Glucose-Capillary: 166 mg/dL — ABNORMAL HIGH (ref 70–99)

## 2019-06-23 DIAGNOSIS — E1069 Type 1 diabetes mellitus with other specified complication: Secondary | ICD-10-CM | POA: Diagnosis not present

## 2019-06-23 LAB — GLUCOSE, CAPILLARY
Glucose-Capillary: 211 mg/dL — ABNORMAL HIGH (ref 70–99)
Glucose-Capillary: 181 mg/dL — ABNORMAL HIGH (ref 70–99)

## 2019-06-24 DIAGNOSIS — E1069 Type 1 diabetes mellitus with other specified complication: Secondary | ICD-10-CM | POA: Diagnosis not present

## 2019-06-24 LAB — GLUCOSE, CAPILLARY
Glucose-Capillary: 166 mg/dL — ABNORMAL HIGH (ref 70–99)
Glucose-Capillary: 181 mg/dL — ABNORMAL HIGH (ref 70–99)

## 2019-06-25 DIAGNOSIS — E1069 Type 1 diabetes mellitus with other specified complication: Secondary | ICD-10-CM | POA: Diagnosis not present

## 2019-06-25 LAB — GLUCOSE, CAPILLARY
Glucose-Capillary: 124 mg/dL — ABNORMAL HIGH (ref 70–99)
Glucose-Capillary: 259 mg/dL — ABNORMAL HIGH (ref 70–99)

## 2019-06-26 DIAGNOSIS — E1069 Type 1 diabetes mellitus with other specified complication: Secondary | ICD-10-CM | POA: Diagnosis not present

## 2019-06-26 LAB — GLUCOSE, CAPILLARY
Glucose-Capillary: 227 mg/dL — ABNORMAL HIGH (ref 70–99)
Glucose-Capillary: 217 mg/dL — ABNORMAL HIGH (ref 70–99)

## 2019-06-29 ENCOUNTER — Other Ambulatory Visit: Payer: Self-pay

## 2019-06-29 ENCOUNTER — Encounter (HOSPITAL_BASED_OUTPATIENT_CLINIC_OR_DEPARTMENT_OTHER): Payer: Medicare Other | Attending: Internal Medicine

## 2019-06-29 DIAGNOSIS — G4730 Sleep apnea, unspecified: Secondary | ICD-10-CM | POA: Diagnosis not present

## 2019-06-29 DIAGNOSIS — Z89511 Acquired absence of right leg below knee: Secondary | ICD-10-CM | POA: Insufficient documentation

## 2019-06-29 DIAGNOSIS — Z794 Long term (current) use of insulin: Secondary | ICD-10-CM | POA: Insufficient documentation

## 2019-06-29 DIAGNOSIS — E1040 Type 1 diabetes mellitus with diabetic neuropathy, unspecified: Secondary | ICD-10-CM | POA: Insufficient documentation

## 2019-06-29 DIAGNOSIS — E10621 Type 1 diabetes mellitus with foot ulcer: Secondary | ICD-10-CM | POA: Insufficient documentation

## 2019-06-29 DIAGNOSIS — E10610 Type 1 diabetes mellitus with diabetic neuropathic arthropathy: Secondary | ICD-10-CM | POA: Insufficient documentation

## 2019-06-29 DIAGNOSIS — L97522 Non-pressure chronic ulcer of other part of left foot with fat layer exposed: Secondary | ICD-10-CM | POA: Insufficient documentation

## 2019-06-29 LAB — GLUCOSE, CAPILLARY
Glucose-Capillary: 163 mg/dL — ABNORMAL HIGH (ref 70–99)
Glucose-Capillary: 150 mg/dL — ABNORMAL HIGH (ref 70–99)
Glucose-Capillary: 199 mg/dL — ABNORMAL HIGH (ref 70–99)
Glucose-Capillary: 152 mg/dL — ABNORMAL HIGH (ref 70–99)

## 2019-07-07 DIAGNOSIS — E10621 Type 1 diabetes mellitus with foot ulcer: Secondary | ICD-10-CM | POA: Diagnosis not present

## 2019-07-14 DIAGNOSIS — E10621 Type 1 diabetes mellitus with foot ulcer: Secondary | ICD-10-CM | POA: Diagnosis not present

## 2019-07-21 DIAGNOSIS — E10621 Type 1 diabetes mellitus with foot ulcer: Secondary | ICD-10-CM | POA: Diagnosis not present

## 2019-07-28 ENCOUNTER — Encounter (HOSPITAL_BASED_OUTPATIENT_CLINIC_OR_DEPARTMENT_OTHER): Payer: Medicare Other | Attending: Internal Medicine

## 2019-07-28 ENCOUNTER — Other Ambulatory Visit: Payer: Self-pay

## 2019-07-28 DIAGNOSIS — M069 Rheumatoid arthritis, unspecified: Secondary | ICD-10-CM | POA: Insufficient documentation

## 2019-07-28 DIAGNOSIS — L97522 Non-pressure chronic ulcer of other part of left foot with fat layer exposed: Secondary | ICD-10-CM | POA: Diagnosis not present

## 2019-07-28 DIAGNOSIS — E10610 Type 1 diabetes mellitus with diabetic neuropathic arthropathy: Secondary | ICD-10-CM | POA: Insufficient documentation

## 2019-07-28 DIAGNOSIS — G4730 Sleep apnea, unspecified: Secondary | ICD-10-CM | POA: Diagnosis not present

## 2019-07-28 DIAGNOSIS — E10621 Type 1 diabetes mellitus with foot ulcer: Secondary | ICD-10-CM | POA: Diagnosis present

## 2019-07-28 DIAGNOSIS — Z89511 Acquired absence of right leg below knee: Secondary | ICD-10-CM | POA: Insufficient documentation

## 2019-07-28 DIAGNOSIS — E1040 Type 1 diabetes mellitus with diabetic neuropathy, unspecified: Secondary | ICD-10-CM | POA: Insufficient documentation

## 2019-08-13 DIAGNOSIS — E10621 Type 1 diabetes mellitus with foot ulcer: Secondary | ICD-10-CM | POA: Diagnosis not present

## 2019-08-27 ENCOUNTER — Other Ambulatory Visit: Payer: Self-pay

## 2019-08-27 ENCOUNTER — Encounter (HOSPITAL_BASED_OUTPATIENT_CLINIC_OR_DEPARTMENT_OTHER): Payer: Medicare Other | Attending: Internal Medicine | Admitting: Internal Medicine

## 2019-08-27 DIAGNOSIS — Z89511 Acquired absence of right leg below knee: Secondary | ICD-10-CM | POA: Insufficient documentation

## 2019-08-27 DIAGNOSIS — Z09 Encounter for follow-up examination after completed treatment for conditions other than malignant neoplasm: Secondary | ICD-10-CM | POA: Insufficient documentation

## 2019-08-27 DIAGNOSIS — G4730 Sleep apnea, unspecified: Secondary | ICD-10-CM | POA: Diagnosis not present

## 2019-08-27 DIAGNOSIS — E1040 Type 1 diabetes mellitus with diabetic neuropathy, unspecified: Secondary | ICD-10-CM | POA: Diagnosis not present

## 2019-08-27 DIAGNOSIS — Z8631 Personal history of diabetic foot ulcer: Secondary | ICD-10-CM | POA: Insufficient documentation

## 2019-08-27 DIAGNOSIS — M069 Rheumatoid arthritis, unspecified: Secondary | ICD-10-CM | POA: Insufficient documentation

## 2019-08-27 DIAGNOSIS — E10610 Type 1 diabetes mellitus with diabetic neuropathic arthropathy: Secondary | ICD-10-CM | POA: Diagnosis not present

## 2019-08-27 DIAGNOSIS — L84 Corns and callosities: Secondary | ICD-10-CM | POA: Insufficient documentation

## 2019-08-27 NOTE — Progress Notes (Signed)
Andrew Schultz, Jaskaran D. ([id number]) Visit Report for 08/27/2019 HPI Details Patient Name: Date of Service: Andrew Schultz, [name] 08/27/2019 1:30 PM Medical Record [id number] Patient Account Number: [id number] Date of Birth/Sex: Treating RN: 03/03/1946 (73 y.o. [name]) Deaton, Bobbi Primary Care Provider: Nita SellsHALL, Terryon Other Clinician: Referring Provider: Treating Provider/Extender:[name], Natasha BenceMichael HALL, Vivianne SpenceJOHN Weeks in Treatment: 35 History of Present Illness HPI Description: 12/10/16; this is a 73 year old man who is a type I diabetic with an insulin pump. 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 He also has a history of diabetic angiopathy although this was previously worked up in [place]. He was told he had small vessel disease they could not be further revascularized. 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 He tells me he was cared for for a period of time in [place] had a wound care center and had hyperbaric oxygen. The patient also has rheumatoid arthritis and is on methotrexate. The patient states he has been dealing with a wound on the right medial ankle since October. He has been applying Silvadene based dressings to this. He was seen in the ER on 10/31/16 at that point felt to have cellulitis and was given prescriptions for Augmentin and Bactrim. He was apparently given a podiatry follow-up. 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 There was diffuse soft tissue swelling about the ankle. He also had a duplex ultrasound of the right leg that was negative for DVT ABIs in this clinic were noncompressible 12/28/16; most of the information the patient brought back from [place]  was from 2013 at that point he had skin ulcers and suspicion of osteomyelitis. MRI of the right foot showed a Charcot foot he had osteomyelitis in the fourth and fifth metatarsals. He has had removal of the fifth metatarsal on the left foot on 6/24 2011 amputation of the right fourth and one half of the great toe amputation of the left second toe left fifth toe. He saw vascular surgery in 2011. At that point the had non-invasive studies showing tibial artery disease Doppler waveforms were within normal limits. There was no evidence of arterial insufficiency of the left leg the right great toe pressure was unable to be obtained secondary to an amputation of the left great toe pressure was above the healing index. It was not felt that he would be a candidate for intervention at that point he was felt to have microvascular blood supply issues. It is very clear he will need follow-up vascular studies and probably venous reflux studies is aware of the right leg. We also have information [facility] [place] of the right foot showed severe destruction of the ankle joint secondary to Charcot form deformity. There are Charcot changes visible to the ankle and midfoot joints. He was referred here. He has been using Santyl in the interim while he was traveling in [place] 01/04/17; not much change here. We have been using Santyl. He has arterial and venous studies next week asks that we can see him in 2 weeks which is reasonable. He had an ultrasound of his right leg rule out DVT in early December and they comment on no venous reflux however this was not a true reflux  study. He clearly has venous insufficiency 01/18/17; the patient has undergone his ordered studies. His VENOUS REFLUX studies show no evidence of a DVT or superficial vein thrombophlebitis. There is evidence of great saphenous vein reflux in the right lower extremity as well as reflux in the common femoral  and popliteal vein. No reflux was noted in the small saphenous vein on the right. ARTERIAL studies showed an ABI on the right of 1.54, waveforms were biphasic at the posterior tibial and dorsalis pedis on the right. He could not have a TBi on the right secondary to prior amputation. There was a suggestion of falsely elevated left TBI due to calcified vessel with a left TBI of 0.95. The patient will definitely need to see vascular surgery with regards to his arterial and venous systems. 02/01/17; appointment with vascular surgery on April 4. Still using Santyl. 02/15/17; the patient was changed to Prisma 2 weeks ago at the time the wound was small but with some depth but a healthy base. Apparently everything was going well to 2 or 3 days ago when the patient and his wife noted deterioration wound. He came in today on his usual appointment. He has vascular surgery appointment on April 4. Considerable deterioration today necrotic material,. Drainage and exposed bone. Mild degree of erythema around the wound which was marked. Change the primary dressing to silver alginate. Augmentin and doxycycline until culture comes back. This was done dark drainage. MRI of the foot is been ordered. He tells me that he has previously had osteomyelitis but I don't believe in this area 02/21/17; I saw this patient a week ago with a marked deterioration in the wound over his right medial malleolus. Culture at the time showed methicillin sensitive staph aureus. I've emperically put him on doxycycline on Augmentin last week. In the meantime he noted increasing swelling and drainage and was admitted to hospital from 3/25 through 3/27. He was given vancomycin however he was discharged on doxycycline for 10 days. He is still using silver alginate to the wound bed. He comes back today with a necrotic wound surface easily palpable friable necrotic bone underneath this. He has not been systemically unwell. He tells me he is trying  his best offload this area and walking minimally. He has not been systemically unwell. 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 There is chronic partial destruction of the distal tibia and fibula, calcaneus and navicular. The navicular is posteriorly displaced. The talus is not definitely seen either destroyed her previously resected. With regards the ulcer this was noted. The wound abuts on the medial malleolus there is thick periosteal thickening but no gross cortical destruction there is a complex fluid collection at this aortic relation between the distal tibia and the calcaneus the feeling was that this was not osteomyelitis given the extensive Charcot changes but osteomyelitis could not definitely be excluded 03/01/17- patient is here for follow-up evaluation of his right medial foot/ankle ulceration. Did have tissue culture and bone pathology sent last week. Bone pathology reveals chronic osteomyelitis, no malignancy. The tissue culture resulted oxacillin sensitive staph aureus. He is currently on Keflex. 03/04/17; the patient's pathology showed chronic osteomyelitis. Bone culture revealed methicillin sensitive staph aureus he is on Keflex. He arrives today in clinic with a piece of bone protruding out of the wound 03/11/17; with considerable difficulty and help of our staff 5 min able to get the patient appointment on Thursday with infectious disease and subtotally next  Monday morning with Dr. Bennett Scrape podiatric ankle surgeon with [facility] with regards to the Charcot right ankle joint and underlying infection. He has been using silver alginate and remains on Keflex 03/18/17; the patient saw Dr. Bennett Scrape who outlined to surgical options 1 a below-knee amputation and the other a debridement of the underlying fibula possibly part of the tibia with an external fixator. Pending the patient is leaning towards the amputation as even if this  could possibly result in a functional limb he would be at risk for further Charcot complications in the future. We are using silver alginate to the wound and continuing Keflex. If he opts for an aggressive posture to attempt to save his leg then he'll probably need IV antibiotics and could be considered for hyperbaric oxygen however I doubt that's what he'll decide given his tone today READMISSION 12/22/2018 This is a patient with type 1 diabetes, severe peripheral neuropathy and severe Charcot foot and ankle deformities. We had him here in 2018 actually looking an area on the right foot. This did not heal in fact became worse and secondarily infected. He saw Dr. Bennett Scrape of podiatric surgery at [facility]. 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 He has a prosthesis and was doing well. He tells me that late in October he developed a small blister on the medial aspect of his left first metatarsal head. He was followed by podiatry initially with Iodosorb and then 2 weeks ago with Santyl to the wound area. His wife changes the dressings. 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 Since he was last here he had a left third toe amputation which healed well. ABIs in this clinic on the left were noncompressible 2/10; the patient's area on the medial aspect of the left great toe. Since the last time the patient was here 2 weeks ago this is actually deteriorated now with exposed tendon. He has been using Santyl. The patient had arterial studies done in February 2018. On the left this showed noncompressible vessels bilaterally but with a TBI of  0.95. Waveforms were biphasic at the posterior tibial and dorsalis pedis. 2/17; x-ray from last week showed the third toe amputation. Medial soft tissue ulceration without evidence of osteomyelitis. We have been using Santyl. His arterial studies to compare with the ones done 2 years ago are tomorrow. Run Grafix PL through his insurance 2/25. Arterial studies were done on 2/18. On the left this showed noncompressible vessels but with triphasic wave waveforms at the PTA and biphasic at the dorsalis pedis. TBI was 0.55 down from 0.95 2 years ago. His wound is on the medial left first metatarsal head. This does not have a viable surface dry and exposed tendon. 3/10/; patient did not have his vascular consult done because of an error in sending the orders. This will be done today. He arrives today with some erythema around the wound nothing but exposed tendon. I have been keeping this moist with hydrogel and silver collagen. He will need an MRI a vascular surgery consult. 3/24; he still not has not seen vascular surgery however he was admitted to hospital from 3/12 through 02/07/2019. He was felt to have  acute osteomyelitis of the left foot. He was seen by his podiatrist at Dr. Allena Katz. There were no cultures done. He was initially treated with vancomycin cefepime and Flagyl however discharged on vancomycin and Augmentin. He has MRI documented septic arthritis and osteomyelitis of the first metatarsal head and neck. The patient arrived in clinic with a sheet of concerns predominantly that advanced home care did not have orders beyond Thursday morning. I E as antibiotics would stop. 3/31; he is now on vancomycin and ertapenem as directed by infectious disease. We have everybody on board. He is going for his angiogram tomorrow although I have not yet reviewed vein and vascular's notes. We have been using Santyl on the wound bed 4/7; he apparently had some renal insufficiency issues and his vancomycin was  temporarily on hold. He did have his angiogram done on 4/1; this did not show any significant stenosis. There was three-vessel runoff. The dominant vessel was the anterior tibial. There was some evidence of digital arterial atherosclerotic disease but certainly nothing that required an intervention from a macrovascular point of view. In preparation for possible HBO I am going to order a hemoglobin A1c. Also notable that he has a low albumin level. He has had a reduction in his appetite which he is attributing to antibiotic therapy. 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 I will need to research what we know about this on [place] link i.e. has he ever had urine studies 4/14; his vancomycin is still on hold as far as the patient is aware. I find this somewhat unusual unless there is more to this than I realized. We have been using Santyl to the wound 4/21; patient seen in conjunction with HBO. I did get some lab work from home health. On 4/31 his vancomycin level was in the 90s. Essentially it is been on hold since then. Lab work that was sent to me today dated 4/20 shows a vancomycin level of 17.8. Fortunately his BUN and creatinine are both normal at 20 and 1.0. His white count is 5.6 hemoglobin 11.5. I also have from 4/17 a sedimentation rate of 96 and a C-reactive protein of 20 although the patient has rheumatoid arthritis. He also tells me that his methotrexate and biologic agent have been put on hold by his rheumatologist because of the underlying osteomyelitis. Finally I am not exactly sure who is following his antibiotics after discussion with the patient today. I did involve Dr. Orvan Falconer via secure text I think when it became clear that his antibiotics are running out after his hospitalization. I also discussed things with his primary doctor Dr. Dwana Melena in [place]. But I am not exactly sure who is following the labs. He has been tolerating HBO well. 4/28; the  patient is tolerating HBO well. There is been improvements in his wound dimension especially the undermining at 1:00 towards the medial foot. Today measured at 1.2 cm. We have been in contact with home health. The antibiotics finished on Sunday. He still has his PICC line in place. C-reactive protein and sedimentation rate were measured yesterday. His methotrexate and biologic agent are still on hold he has not had any trouble with increasing pain from his RA 5/5; the patient is tolerating HBO well. He is seen today in conjunction with that his wound is improved had been improving although it is not much different from last week. Undermining towards the medial foot today measured at 1.8 up from 1.2 last week. Went to see  Dr. Luciana Axe of ophthalmology. Fortunately did not have anything major wrong. Most of the symptoms he was having in his eyes were related to pseudophakia. He feels better today and he tolerated his treatment well. We are using endoform. Previously his insurance which is through [place] [employer] had refused them for Grafix. I am going to run Dermagraft I also looked over his inflammatory markers his sed rate is still over 100. I do not know how to interpret this in this patient who has rheumatoid arthritis. I am not sure that the persistence of an elevated sed rate in this man means of treatment failure 5/12; the patient was seen today for wound care evaluation in conjunction with hyperbarics that he is tolerating well. We have been using endoform. The undermining area to along the medial aspect of his foot and come down nicely however on our evaluation today that is gone back up to 4 cm which is really very disappointing. Other than that the wound bed paradoxically looks somewhat better 5/19- Patient seen after HBO, the ulcer now undermines to 3.5 cm which is better than 4.0 last time 5/26; patient was seen after HBO. He apparently had a single Dermagraft applied last week. I  applied Dermagraft No. 2. Careful attention to the tunneling along the medial foot which currently measures 3.5 cm about the same as last time 6/2; patient was seen after HBO. Dermagraft No. 3 applied. Tunneling area improved condition of the wound bed and improved. 6/9; patient was seen after HBO. The tunneling area is about 1 cm about the same as last time. Dermagraft No. 4 applied. We are also going to recertify him for hyperbarics which is really seems to have helped the wound healing process. 6/16; patient was seen after HBO. The tunneling area has deepened this week towards the medial part of his foot but that had come in last week. Nevertheless the wound bed looks healthy. Dermagraft No. 5 applied after debridement 6/23. Patient was seen after HBO tunneling area is down from 4 to 3.6 cm. I think we have had some contraction of the surface area. There is no palpable bone. No evidence of infection. Dermagraft No. 6 applied 6/30; the tunneling expanded once again to 7 cm. Yet the wound itself looks actually quite good. Dermagraft No. 7 7/7; tunneling at 4.5. Wound dimensions are down from last week especially in width. Dermagraft No. 8 7/14; the tunneling is down in the wound has less with. What I can see the base of the wound looks healthy. We will change him to endoform AG. He continues along with HBO we are making progress albeit slow 7/21; tunneling in the most proximal part of this wound is at 1 cm. The rest of this is down to a linear slitlike area. Not much with. No evidence of infection 7/28; the tunneling and the most proximal part of this wound is down to 0.3 cm measured by myself otherwise it remains a linear slitlike area. The base of this appears to be healthy. No evidence of infection. He is seen today along with hyperbarics 8/11-Left medial foot wound has covering of eschar-like tissue but it is otherwise small opening. 8/18; left medial foot using endoform. Small opening  what I can see of the base of this looks healthy. Most impressively the tunneling area towards the medial part of his foot is close down there is some undermining from roughly 1:00 to 4:00 of roughly 2 mm. 8/25; continued improvement using endoform. There is no tunneling  or undermining that I could identify. 9/1; small slitlike wound on the left medial part of his foot. Very difficult to fully examine this what I can see of the wound bed looks healthy. Using endoform 9/17; small slitlike wound on the left medial part of his foot. We had received a call earlier this week from the patient stating that the wound had deteriorated. There was apparently some malodorous drainage but it is not recurred. We could not get him into the clinic until today. There really is not much change in the wound perhaps slightly shallower. We have been using endoform, he has been using the pieces we had left over. 10/1; small slitlike wound on the left medial part of his foot. There is some callus over the area but no obvious opening. I changed him to Iodoflex last visit 2 weeks ago Electronic Signature(s) Signed: 08/27/2019 6:13:48 PM By: Baltazar Najjar MD Entered By: Baltazar Najjar on 08/27/2019 14:27:57 -------------------------------------------------------------------------------- Physical Exam Details Patient Name: Date of Service: Marica Otter D. 08/27/2019 1:30 PM Medical Record [id number] Patient Account Number: [id number] Date of Birth/Sex: Treating RN: [birth year] (73 y.o. [name] Primary Care Provider: Nita Sells Other Clinician: Referring Provider: Treating Provider/Extender:[name], [name], [name] in Treatment: 35 Constitutional Sitting or standing Blood Pressure is within target range for patient.. Pulse regular and within target range for patient.Marland Kitchen Respirations regular, non-labored and within target range.. Temperature is normal and within the target range for the  patient.Marland Kitchen Appears in no distress. Notes Wound exam; left medial foot. There is no open area. Covered by callus with subdermal hemorrhage. Using a #5 curette I removed some of the surface here I could identify no open wound. The original slitlike wound from 2 weeks ago is adherent. There is no evidence of surrounding infection Electronic Signature(s) Signed: 08/27/2019 6:13:48 PM By: Baltazar Najjar MD Entered By: Baltazar Najjar on 08/27/2019 14:28:58 -------------------------------------------------------------------------------- Physician Orders Details Patient Name: Date of Service: Andrew Anchors. 08/27/2019 1:30 PM Medical Record [id number] Patient Account Number: [id number] Date of Birth/Sex: Treating RN: [birth year] (73 y.o. [name] Primary Care Provider: Nita Sells Other Clinician: Referring Provider: Treating Provider/Extender:[name], [name], [name] in Treatment: 86 Verbal / Phone Orders: No Diagnosis Coding ICD-10 Coding Code Description [phone] Subacute osteomyelitis, left ankle and foot L97.524 Non-pressure chronic ulcer of other part of left foot with necrosis of bone E10.621 Type 1 diabetes mellitus with foot ulcer E10.40 Type 1 diabetes mellitus with diabetic neuropathy, unspecified M14.672 Charcot's joint, left ankle and foot Z89.511 Acquired absence of right leg below knee Follow-up Appointments Return Appointment in 2 weeks. - follow up only to ensure left medical foot remains closed. Secondary Dressing Other: - pad healed area well for protection. Edema Control Avoid standing for long periods of time Elevate legs to the level of the heart or above for 30 minutes daily and/or when sitting, a frequency of: - throughout the day. Off-Loading Open toe surgical shoe to: - continue to wear. Electronic Signature(s) Signed: 08/27/2019 6:13:48 PM By: Baltazar Najjar MD Signed: 08/27/2019 6:31:45 PM By: Shawn Stall Entered By: Shawn Stall on  08/27/2019 14:00:51 -------------------------------------------------------------------------------- Problem List Details Patient Name: Date of Service: Andrew Anchors. 08/27/2019 1:30 PM Medical Record [id number] Patient Account Number: [id number] Date of Birth/Sex: Treating RN: [birth year] (73 y.o. [name] Primary Care Provider: Nita Sells Other Clinician: Referring Provider: Treating Provider/Extender:[name], [name], [name] in Treatment: [id number] Active Problems ICD-10 Evaluated Encounter Code Description Active Date Today Diagnosis [phone] Subacute  osteomyelitis, left ankle and foot 02/17/2019 No Yes L97.524 Non-pressure chronic ulcer of other part of left foot 02/17/2019 No Yes with necrosis of bone E10.621 Type 1 diabetes mellitus with foot ulcer 12/22/2018 No Yes E10.40 Type 1 diabetes mellitus with diabetic neuropathy, 12/22/2018 No Yes unspecified M14.672 Charcot's joint, left ankle and foot 12/22/2018 No Yes Z89.511 Acquired absence of right leg below knee 12/22/2018 No Yes Inactive Problems Resolved Problems Electronic Signature(s) Signed: 08/27/2019 6:13:48 PM By: Baltazar Najjar MD Entered By: Baltazar Najjar on 08/27/2019 14:27:12 -------------------------------------------------------------------------------- Progress Note Details Patient Name: Date of Service: Marica Otter D. 08/27/2019 1:30 PM Medical Record [id number] Patient Account Number: [id number] Date of Birth/Sex: Treating RN: May 06, 1946 (73 y.o. 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 He is a retired Scientist, clinical (histocompatibility and immunogenetics). He has a history of severe diabetic-related problems including neuropathy with a Charcot foot and ankle  worse on the right. He also has a history of diabetic angiopathy although this was previously worked up in [place]. He was told he had small vessel disease they could not be further revascularized. 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 He tells me he was cared for for a period of time in [place] had a wound care center and had hyperbaric oxygen. The patient also has rheumatoid arthritis and is on methotrexate. The patient states he has been dealing with a wound on the right medial ankle since October. He has been applying Silvadene based dressings to this. He was seen in the ER on 10/31/16 at that point felt to have cellulitis and was given prescriptions for Augmentin and Bactrim. He was apparently given a podiatry follow-up. 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 There was diffuse soft tissue swelling about the ankle. He also had a duplex ultrasound of the right leg that was negative for DVT ABIs in this clinic were noncompressible 12/28/16; most of the information the patient brought back from [place] was from 2013 at that point he had skin ulcers and suspicion of osteomyelitis. MRI of the right foot showed a Charcot foot he had osteomyelitis in the fourth and fifth metatarsals. He has had removal of the fifth metatarsal on the left foot on 6/24 2011 amputation of the right fourth and one half of the great toe amputation of the left second toe left fifth toe. He saw vascular surgery in 2011. At that point the had non-invasive studies showing tibial artery disease Doppler waveforms were within normal limits. There was no evidence of arterial insufficiency of the left leg the right great toe pressure was unable to be obtained secondary to an amputation of the left great toe pressure was above  the healing index. It was not felt that he would be a candidate for intervention at that point he was felt to have microvascular blood supply issues. It is very clear he will need follow-up vascular studies and probably venous reflux studies is aware of the right leg. We also have information from his podiatric surgeons in [facility] x-ray of the right foot showed severe destruction of the ankle joint secondary to Charcot form deformity. There are  Charcot changes visible to the ankle and midfoot joints. He was referred here. He has been using Santyl in the interim while he was traveling in [place] 01/04/17; not much change here. We have been using Santyl. He has arterial and venous studies next week asks that we can see him in 2 weeks which is reasonable. He had an ultrasound of his right leg rule out DVT in early December and they comment on no venous reflux however this was not a true reflux study. He clearly has venous insufficiency 01/18/17; the patient has undergone his ordered studies. His VENOUS REFLUX studies show no evidence of a DVT or superficial vein thrombophlebitis. There is evidence of great saphenous vein reflux in the right lower extremity as well as reflux in the common femoral and popliteal vein. No reflux was noted in the small saphenous vein on the right. ARTERIAL studies showed an ABI on the right of 1.54, waveforms were biphasic at the posterior tibial and dorsalis pedis on the right. He could not have a TBi on the right secondary to prior amputation. There was a suggestion of falsely elevated left TBI due to calcified vessel with a left TBI of 0.95. The patient will definitely need to see vascular surgery with regards to his arterial and venous systems. 02/01/17; appointment with vascular surgery on April 4. Still using Santyl. 02/15/17; the patient was changed to Prisma 2 weeks ago at the time the wound was small but with some depth but a healthy base.  Apparently everything was going well to 2 or 3 days ago when the patient and his wife noted deterioration wound. He came in today on his usual appointment. He has vascular surgery appointment on April 4. Considerable deterioration today necrotic material,. Drainage and exposed bone. Mild degree of erythema around the wound which was marked. Change the primary dressing to silver alginate. Augmentin and doxycycline until culture comes back. This was done dark drainage. MRI of the foot is been ordered. He tells me that he has previously had osteomyelitis but I don't believe in this area 02/21/17; I saw this patient a week ago with a marked deterioration in the wound over his right medial malleolus. Culture at the time showed methicillin sensitive staph aureus. I've emperically put him on doxycycline on Augmentin last week. In the meantime he noted increasing swelling and drainage and was admitted to hospital from 3/25 through 3/27. He was given vancomycin however he was discharged on doxycycline for 10 days. He is still using silver alginate to the wound bed. He comes back today with a necrotic wound surface easily palpable friable necrotic bone underneath this. He has not been systemically unwell. He tells me he is trying his best offload this area and walking minimally. He has not been systemically unwell. 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 There is chronic partial destruction of the distal tibia and fibula, calcaneus and navicular. The navicular is posteriorly displaced. The talus is not definitely seen either destroyed her previously resected. With regards the ulcer this was noted. The wound abuts on the medial malleolus there is thick periosteal thickening but no gross cortical destruction there is a complex fluid collection at this aortic relation between the distal tibia and the calcaneus the feeling was that this was not  osteomyelitis given the extensive Charcot changes but osteomyelitis could not definitely be excluded 03/01/17- patient is here for follow-up evaluation of his right medial foot/ankle ulceration. Did have tissue  culture and bone pathology sent last week. Bone pathology reveals chronic osteomyelitis, no malignancy. The tissue culture resulted oxacillin sensitive staph aureus. He is currently on Keflex. 03/04/17; the patient's pathology showed chronic osteomyelitis. Bone culture revealed methicillin sensitive staph aureus he is on Keflex. He arrives today in clinic with a piece of bone protruding out of the wound 03/11/17; with considerable difficulty and help of our staff 5 min able to get the patient appointment on Thursday with infectious disease and subtotally next Monday morning with Dr. Bennett Scrape podiatric ankle surgeon with [facility] with regards to the Charcot right ankle joint and underlying infection. He has been using silver alginate and remains on Keflex 03/18/17; the patient saw Dr. Bennett Scrape who outlined to surgical options 1 a below-knee amputation and the other a debridement of the underlying fibula possibly part of the tibia with an external fixator. Pending the patient is leaning towards the amputation as even if this could possibly result in a functional limb he would be at risk for further Charcot complications in the future. We are using silver alginate to the wound and continuing Keflex. If he opts for an aggressive posture to attempt to save his leg then he'll probably need IV antibiotics and could be considered for hyperbaric oxygen however I doubt that's what he'll decide given his tone today READMISSION 12/22/2018 This is a patient with type 1 diabetes, severe peripheral neuropathy and severe Charcot foot and ankle deformities. We had him here in 2018 actually looking an area on the right foot. This did not heal in fact became worse and secondarily infected. He saw Dr. Bennett Scrape of podiatric  surgery at [facility]. 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 He has a prosthesis and was doing well. He tells me that late in October he developed a small blister on the medial aspect of his left first metatarsal head. He was followed by podiatry initially with Iodosorb and then 2 weeks ago with Santyl to the wound area. His wife changes the dressings. 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 Since he was last here he had a left third toe amputation which healed well. ABIs in this clinic on the left were noncompressible 2/10; the patient's area on the medial aspect of the left great toe. Since the last time the patient was here 2 weeks ago this is actually deteriorated now with exposed tendon. He has been using Santyl. The patient had arterial studies done in February 2018. On the left this showed noncompressible vessels bilaterally but with a TBI of 0.95. Waveforms were biphasic at the posterior tibial and dorsalis pedis. 2/17; x-ray from last week showed the third toe amputation. Medial soft tissue ulceration without evidence of osteomyelitis. We have been using Santyl. His arterial studies to compare with the ones done 2 years ago are tomorrow. Run Grafix PL through his insurance 2/25. Arterial studies were done on 2/18. On the left this showed noncompressible vessels but with triphasic wave waveforms at the PTA and biphasic at the dorsalis pedis. TBI was 0.55 down from 0.95 2 years ago. His wound is on the medial left first metatarsal head. This does not have a viable  surface dry and exposed tendon. 3/10/; patient did not have  his vascular consult done because of an error in sending the orders. This will be done today. He arrives today with some erythema around the wound nothing but exposed tendon. I have been keeping this moist with hydrogel and silver collagen. He will need an MRI a vascular surgery consult. 3/24; he still not has not seen vascular surgery however he was admitted to hospital from 3/12 through 02/07/2019. He was felt to have acute osteomyelitis of the left foot. He was seen by his podiatrist at Dr. Allena Katz. There were no cultures done. He was initially treated with vancomycin cefepime and Flagyl however discharged on vancomycin and Augmentin. He has MRI documented septic arthritis and osteomyelitis of the first metatarsal head and neck. The patient arrived in clinic with a sheet of concerns predominantly that [facility] home care did not have orders beyond Thursday morning. I E as antibiotics would stop. 3/31; he is now on vancomycin and ertapenem as directed by infectious disease. We have everybody on board. He is going for his angiogram tomorrow although I have not yet reviewed vein and vascular's notes. We have been using Santyl on the wound bed 4/7; he apparently had some renal insufficiency issues and his vancomycin was temporarily on hold. He did have his angiogram done on 4/1; this did not show any significant stenosis. There was three-vessel runoff. The dominant vessel was the anterior tibial. There was some evidence of digital arterial atherosclerotic disease but certainly nothing that required an intervention from a macrovascular point of view. In preparation for possible HBO I am going to order a hemoglobin A1c. Also notable that he has a low albumin level. He has had a reduction in his appetite which he is attributing to antibiotic therapy. 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 I will need to research what we know about this on [place] link i.e. has he ever had urine  studies 4/14; his vancomycin is still on hold as far as the patient is aware. I find this somewhat unusual unless there is more to this than I realized. We have been using Santyl to the wound 4/21; patient seen in conjunction with HBO. I did get some lab work from home health. On 4/31 his vancomycin level was in the 90s. Essentially it is been on hold since then. Lab work that was sent to me today dated 4/20 shows a vancomycin level of 17.8. Fortunately his BUN and creatinine are both normal at 20 and 1.0. His white count is 5.6 hemoglobin 11.5. I also have from 4/17 a sedimentation rate of 96 and a C-reactive protein of 20 although the patient has rheumatoid arthritis. He also tells me that his methotrexate and biologic agent have been put on hold by his rheumatologist because of the underlying osteomyelitis. Finally I am not exactly sure who is following his antibiotics after discussion with the patient today. I did involve Dr. Orvan Falconer via secure text I think when it became clear that his antibiotics are running out after his hospitalization. I also discussed things with his primary doctor Dr. Dwana Melena in [place]. But I am not exactly sure who is following the labs. He has been tolerating HBO well. 4/28; the patient is tolerating HBO well. There is been improvements in his wound dimension especially the undermining at 1:00 towards the medial foot. Today measured at 1.2 cm. We have been in contact with home health. The antibiotics finished on Sunday. He  still has his PICC line in place. C-reactive protein and sedimentation rate were measured yesterday. His methotrexate and biologic agent are still on hold he has not had any trouble with increasing pain from his RA 5/5; the patient is tolerating HBO well. He is seen today in conjunction with that his wound is improved had been improving although it is not much different from last week. Undermining towards the medial foot today measured  at 1.8 up from 1.2 last week. Went to see Dr. Zadie Rhine of ophthalmology. Fortunately did not have anything major wrong. Most of the symptoms he was having in his eyes were related to pseudophakia. He feels better today and he tolerated his treatment well. We are using endoform. Previously his insurance which is through [place] [employer] had refused them for Grafix. I am going to run Dermagraft I also looked over his inflammatory markers his sed rate is still over 100. I do not know how to interpret this in this patient who has rheumatoid arthritis. I am not sure that the persistence of an elevated sed rate in this man means of treatment failure 5/12; the patient was seen today for wound care evaluation in conjunction with hyperbarics that he is tolerating well. We have been using endoform. The undermining area to along the medial aspect of his foot and come down nicely however on our evaluation today that is gone back up to 4 cm which is really very disappointing. Other than that the wound bed paradoxically looks somewhat better 5/19- Patient seen after HBO, the ulcer now undermines to 3.5 cm which is better than 4.0 last time 5/26; patient was seen after HBO. He apparently had a single Dermagraft applied last week. I applied Dermagraft No. 2. Careful attention to the tunneling along the medial foot which currently measures 3.5 cm about the same as last time 6/2; patient was seen after HBO. Dermagraft No. 3 applied. Tunneling area improved condition of the wound bed and improved. 6/9; patient was seen after HBO. The tunneling area is about 1 cm about the same as last time. Dermagraft No. 4 applied. We are also going to recertify him for hyperbarics which is really seems to have helped the wound healing process. 6/16; patient was seen after HBO. The tunneling area has deepened this week towards the medial part of his foot but that had come in last week. Nevertheless the wound bed looks healthy.  Dermagraft No. 5 applied after debridement 6/23. Patient was seen after HBO tunneling area is down from 4 to 3.6 cm. I think we have had some contraction of the surface area. There is no palpable bone. No evidence of infection. Dermagraft No. 6 applied 6/30; the tunneling expanded once again to 7 cm. Yet the wound itself looks actually quite good. Dermagraft No. 7 7/7; tunneling at 4.5. Wound dimensions are down from last week especially in width. Dermagraft No. 8 7/14; the tunneling is down in the wound has less with. What I can see the base of the wound looks healthy. We will change him to endoform AG. He continues along with HBO we are making progress albeit slow 7/21; tunneling in the most proximal part of this wound is at 1 cm. The rest of this is down to a linear slitlike area. Not much with. No evidence of infection 7/28; the tunneling and the most proximal part of this wound is down to 0.3 cm measured by myself otherwise it remains a linear slitlike area. The base of this appears to  be healthy. No evidence of infection. He is seen today along with hyperbarics 8/11-Left medial foot wound has covering of eschar-like tissue but it is otherwise small opening. 8/18; left medial foot using endoform. Small opening what I can see of the base of this looks healthy. Most impressively the tunneling area towards the medial part of his foot is close down there is some undermining from roughly 1:00 to 4:00 of roughly 2 mm. 8/25; continued improvement using endoform. There is no tunneling or undermining that I could identify. 9/1; small slitlike wound on the left medial part of his foot. Very difficult to fully examine this what I can see of the wound bed looks healthy. Using endoform 9/17; small slitlike wound on the left medial part of his foot. We had received a call earlier this week from the patient stating that the wound had deteriorated. There was apparently some malodorous drainage but it is  not recurred. We could not get him into the clinic until today. There really is not much change in the wound perhaps slightly shallower. We have been using endoform, he has been using the pieces we had left over. 10/1; small slitlike wound on the left medial part of his foot. There is some callus over the area but no obvious opening. I changed him to Iodoflex last visit 2 weeks ago Objective Constitutional Sitting or standing Blood Pressure is within target range for patient.. Pulse regular and within target range for patient.Marland Kitchen Respirations regular, non-labored and within target range.. Temperature is normal and within the target range for the patient.Marland Kitchen Appears in no distress. Vitals Time Taken: 1:38 PM, Height: 67 in, Source: Stated, Weight: 208 lbs, Source: Stated, BMI: 32.6, Temperature: 98.1 F, Pulse: 83 bpm, Respiratory Rate: 18 breaths/min, Blood Pressure: 118/49 mmHg, Capillary Blood Glucose: 106 mg/dl. General Notes: glucose per pt report General Notes: Wound exam; left medial foot. There is no open area. Covered by callus with subdermal hemorrhage. Using a #5 curette I removed some of the surface here I could identify no open wound. The original slitlike wound from 2 weeks ago is adherent. There is no evidence of surrounding infection Integumentary (Hair, Skin) Wound #2 status is Open. Original cause of wound was Gradually Appeared. The wound is located on the Left,Medial Foot. The wound measures 0cm length x 0cm width x 0cm depth; 0cm^2 area and 0cm^3 volume. There is no tunneling or undermining noted. There is a none present amount of drainage noted. The wound margin is flat and intact. There is no granulation within the wound bed. There is no necrotic tissue within the wound bed. Assessment Active Problems ICD-10 Subacute osteomyelitis, left ankle and foot Non-pressure chronic ulcer of other part of left foot with necrosis of bone Type 1 diabetes mellitus with foot  ulcer Type 1 diabetes mellitus with diabetic neuropathy, unspecified Charcot's joint, left ankle and foot Acquired absence of right leg below knee Plan Follow-up Appointments: Return Appointment in 2 weeks. - follow up only to ensure left medical foot remains closed. Secondary Dressing: Other: - pad healed area well for protection. Edema Control: Avoid standing for long periods of time Elevate legs to the level of the heart or above for 30 minutes daily and/or when sitting, a frequency of: - throughout the day. Off-Loading: Open toe surgical shoe to: - continue to wear. 1. The wound is closed over. I removed some of the callus but could not identify any open area. 2. Self adherent pad to protect this area and his  foot wear. He states he is going to continue to wear his surgical shoe for the next few weeks 3. The patient had underlying osteomyelitis he underwent IV antibiotics and hyperbarics. 4. I will see him back in 2 weeks to look at the area. If he is still closed at that point I will discharge him from the clinic. 5. I went over the issue of "closed" versus "healed". At this point the wound is closed but we will need to monitor to see if he develops reopening from underlying osteomyelitis over time Electronic Signature(s) Signed: 08/27/2019 6:13:48 PM By: Baltazar Najjar MD Entered By: Baltazar Najjar on 08/27/2019 14:30:48 -------------------------------------------------------------------------------- SuperBill Details Patient Name: Date of Service: Andrew Anchors 08/27/2019 Medical Record [id number] Patient Account Number: [id number] Date of Birth/Sex: Treating RN: [birth year] (73 y.o. [name] Primary Care Provider: Nita Sells Other Clinician: Referring Provider: Treating Provider/Extender:[name], [name], [name] in Treatment: 35 Diagnosis Coding ICD-10 Codes Code Description [phone] Subacute osteomyelitis, left ankle and foot L97.524  Non-pressure chronic ulcer of other part of left foot with necrosis of bone E10.621 Type 1 diabetes mellitus with foot ulcer E10.40 Type 1 diabetes mellitus with diabetic neuropathy, unspecified M14.672 Charcot's joint, left ankle and foot Z89.511 Acquired absence of right leg below knee Facility Procedures The patient participates with Medicare or their insurance follows the Medicare Facility Guidelines: CPT4 Code Description Modifier Quantity [phone] - WOUND CARE VISIT-LEV 3 EST PT 1 Physician Procedures CPT4 Code Description: [id number] - WC PHYS LEVEL 2 - EST PT ICD-10 Diagnosis Description L97.524 Non-pressure chronic ulcer of other part of left foot with M86.272 Subacute osteomyelitis, left ankle and foot Modifier: necrosis of Quantity: 1 bone Electronic Signature(s) Signed: 08/27/2019 6:13:48 PM By: Baltazar Najjar MD Entered By: Baltazar Najjar on 08/27/2019 14:31:09

## 2019-08-28 NOTE — Progress Notes (Signed)
[name], Emon D. ([id number]) Visit Report for 08/27/2019 Arrival Information Details Patient Name: Date of Service: [name], Geovanny D. 08/27/2019 1:30 PM Medical Record [id number] Patient Account Number: [id number] Date of Birth/Sex: Treating RN: 1946/06/23 (73 y.o. Bayard HuggerM) Boehlein, Bonita QuinLinda Primary Care Lani Havlik: Nita SellsHALL, K[place] Other Clinician: Referring Nozomi Mettler: Treating Latia Mataya/Extender:[name], Natasha BenceMichael HALL, Vivianne SpenceJOHN Weeks in Treatment: 35 Visit Information History Since Last Visit Added or deleted any medications: No Patient Arrived: Gilmer MorCane Any new allergies or adverse reactions: No Arrival Time: 13:35 Had a fall or experienced change in No Accompanied By: self activities of daily living that may affect Transfer Assistance: None risk of falls: Patient Identification Verified: Yes Signs or symptoms of abuse/neglect since last No Secondary Verification Process Yes visito Completed: Hospitalized since last visit: No Patient Requires Transmission-Based No Implantable device outside of the clinic excluding No Precautions: cellular tissue based products placed in the center Patient Has Alerts: Yes since last visit: Patient Alerts: L ABI non Has Dressing in Place as Prescribed: Yes compressible Pain Present Now: No Electronic Signature(s) Signed: 08/28/2019 7:05:19 PM By: Zenaida DeedBoehlein, Linda RN, BSN Entered By: Zenaida DeedBoehlein, Linda on 08/27/2019 13:38:10 -------------------------------------------------------------------------------- Clinic Level of Care Assessment Details Patient Name: Date of Service: [name], Page D. 08/27/2019 1:30 PM Medical Record [id number] Patient Account Number: [id number] Date of Birth/Sex: Treating RN: 1946/06/23 (73 y.o. [name]) Deaton, Bobbi Primary Care Shaughn Thomley: Nita SellsHALL, Qusai Other Clinician: Referring Clotee Schlicker: Treating Keagan Anthis/Extender:[name], Natasha BenceMichael HALL, Vivianne SpenceJOHN Weeks in Treatment: 35 Clinic Level of Care Assessment Items TOOL 4 Quantity Score X - Use when  only an EandM is performed on FOLLOW-UP visit 1 0 ASSESSMENTS - Nursing Assessment / Reassessment X - Reassessment of Co-morbidities (includes updates in patient status) 1 10 X - Reassessment of Adherence to Treatment Plan 1 5 ASSESSMENTS - Wound and Skin Assessment / Reassessment X - Simple Wound Assessment / Reassessment - one wound 1 5 []  - Complex Wound Assessment / Reassessment - multiple wounds 0 X - Dermatologic / Skin Assessment (not related to wound area) 1 10 ASSESSMENTS - Focused Assessment X - Circumferential Edema Measurements - multi extremities 1 5 X - Nutritional Assessment / Counseling / Intervention 1 10 []  - Lower Extremity Assessment (monofilament, tuning fork, pulses) 0 []  - Peripheral Arterial Disease Assessment (using hand held doppler) 0 ASSESSMENTS - Ostomy and/or Continence Assessment and Care []  - Incontinence Assessment and Management 0 []  - Ostomy Care Assessment and Management (repouching, etc.) 0 PROCESS - Coordination of Care X - Simple Patient / Family Education for ongoing care 1 15 []  - Complex (extensive) Patient / Family Education for ongoing care 0 X - Staff obtains ChiropractorConsents, Records, Test Results / Process Orders 1 10 []  - Staff telephones HHA, Nursing Homes / Clarify orders / etc 0 []  - Routine Transfer to another Facility (non-emergent condition) 0 []  - Routine Hospital Admission (non-emergent condition) 0 []  - New Admissions / Manufacturing engineernsurance Authorizations / Ordering NPWT, Apligraf, etc. 0 []  - Emergency Hospital Admission (emergent condition) 0 X - Simple Discharge Coordination 1 10 []  - Complex (extensive) Discharge Coordination 0 PROCESS - Special Needs []  - Pediatric / Minor Patient Management 0 []  - Isolation Patient Management 0 []  - Hearing / Language / Visual special needs 0 []  - Assessment of Community assistance (transportation, D/C planning, etc.) 0 []  - Additional assistance / Altered mentation 0 []  - Support Surface(s) Assessment  (bed, cushion, seat, etc.) 0 INTERVENTIONS - Wound Cleansing / Measurement X - Simple Wound Cleansing - one wound 1 5 []  - Complex Wound  Cleansing - multiple wounds 0 X - Wound Imaging (photographs - any number of wounds) 1 5 []  - Wound Tracing (instead of photographs) 0 X - Simple Wound Measurement - one wound 1 5 []  - Complex Wound Measurement - multiple wounds 0 INTERVENTIONS - Wound Dressings X - Small Wound Dressing one or multiple wounds 1 10 []  - Medium Wound Dressing one or multiple wounds 0 []  - Large Wound Dressing one or multiple wounds 0 []  - Application of Medications - topical 0 []  - Application of Medications - injection 0 INTERVENTIONS - Miscellaneous []  - External ear exam 0 []  - Specimen Collection (cultures, biopsies, blood, body fluids, etc.) 0 []  - Specimen(s) / Culture(s) sent or taken to Lab for analysis 0 []  - Patient Transfer (multiple staff / Nurse, adult / Similar devices) 0 []  - Simple Staple / Suture removal (25 or less) 0 []  - Complex Staple / Suture removal (26 or more) 0 []  - Hypo / Hyperglycemic Management (close monitor of Blood Glucose) 0 []  - Ankle / Brachial Index (ABI) - do not check if billed separately 0 X - Vital Signs 1 5 Has the patient been seen at the hospital within the last three years: Yes Total Score: 110 Level Of Care: New/Established - Level 3 Electronic Signature(s) Signed: 08/27/2019 6:31:45 PM By: Shawn Stall Entered By: Shawn Stall on 08/27/2019 14:01:55 -------------------------------------------------------------------------------- Encounter Discharge Information Details Patient Name: Date of Service: Marica Otter D. 08/27/2019 1:30 PM Medical Record [id number] Patient Account Number: [id number] Date of Birth/Sex: Treating RN: 1946-06-13 (73 y.o. [name] Primary Care Valyncia Wiens: Nita Sells Other Clinician: Referring Emilina Smarr: Treating Atiyah Bauer/Extender:[name], [name], [name] in Treatment:  58 Encounter Discharge Information Items Discharge Condition: Stable Ambulatory Status: Cane Discharge Destination: Home Transportation: Private Auto Accompanied By: self Schedule Follow-up Appointment: Yes Clinical Summary of Care: Electronic Signature(s) Signed: 08/27/2019 6:31:45 PM By: Shawn Stall Entered By: Shawn Stall on 08/27/2019 14:02:25 -------------------------------------------------------------------------------- Lower Extremity Assessment Details Patient Name: Date of Service: AREG, CHEY 08/27/2019 1:30 PM Medical Record [id number] Patient Account Number: [id number] Date of Birth/Sex: Treating RN: Feb 16, 1946 (73 y.o. Damaris Schooner Primary Care Kentrell Guettler: Nita Sells Other Clinician: Referring Tyshun Tuckerman: Treating Juanya Villavicencio/Extender:[name], [name], [name] in Treatment: 35 Edema Assessment Assessed: [Left: No] [Right: No] Edema: [Left: N] [Right: o] Calf Left: Right: Point of Measurement: 35 cm From Medial Instep 41.4 cm cm Ankle Left: Right: Point of Measurement: 9 cm From Medial Instep 26.6 cm cm Vascular Assessment Pulses: Dorsalis Pedis Palpable: [Left:Yes] Electronic Signature(s) Signed: 08/28/2019 7:05:19 PM By: Zenaida Deed RN, BSN Entered By: Zenaida Deed on 08/27/2019 13:43:21 -------------------------------------------------------------------------------- Multi Wound Chart Details Patient Name: Date of Service: Marica Otter D. 08/27/2019 1:30 PM Medical Record [id number] Patient Account Number: [id number] Date of Birth/Sex: Treating RN: Jan 29, 1946 (73 y.o. 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] Comorbid History: [2:Sleep Apnea, Type I Diabetes, Rheumatoid Arthritis, Osteomyelitis, Neuropathy, Seizure Disorder] [N/A:N/A] Date Acquired: [2:09/26/2018] [N/A:N/A] Weeks of Treatment: [2:35] [N/A:N/A] Wound Status: [2:Open] [N/A:N/A] Measurements L x W x D 0x0x0 [N/A:N/A] (cm) Area (cm) : [2:0] [N/A:N/A] Volume (cm) : [2:0] [N/A:N/A] % Reduction in  Area: [2:100.00%] [N/A:N/A] % Reduction in Volume: 100.00% [N/A:N/A] Classification: [2:Grade 3] [N/A:N/A] Exudate Amount: [2:None Present] [N/A:N/A] Wound Margin: [2:Flat and Intact] [N/A:N/A] Granulation Amount: [2:None Present (0%)] [N/A:N/A] Necrotic Amount: [2:None Present (0%)] [N/A:N/A] Exposed Structures: [2:Fascia: No Fat Layer (Subcutaneous Tissue) Exposed: No Tendon: No Muscle: No Joint: No Bone: No Large (67-100%)] [N/A:N/A N/A] Treatment Notes Electronic Signature(s) Signed: 08/27/2019 6:13:48 PM By: Linton Ham MD Signed: 08/27/2019 6:31:45 PM By: Deon Pilling Entered By: Linton Ham on 08/27/2019 14:27:20 -------------------------------------------------------------------------------- Multi-Disciplinary Care Plan Details Patient Name: Date of Service: Ross Ludwig D. 08/27/2019 1:30 PM Medical Record [id number] Patient Account Number: [id number] Date of Birth/Sex: Treating RN: 1946-02-23 (73 y.o. Hessie Diener Primary Care Emerly Prak: Allyn Kenner Other Clinician: Referring Morty Ortwein: Treating Ellean Firman/Extender:[name], Ludger Nutting, Moses Manners in Treatment: 35 Active Inactive Wound/Skin Impairment Nursing Diagnoses: Impaired tissue integrity Knowledge deficit related to ulceration/compromised skin integrity Goals: Patient/caregiver will verbalize understanding of skin care  regimen Date Initiated: 12/22/2018 Target Resolution Date: 09/10/2019 Goal Status: Active Ulcer/skin breakdown will have a volume reduction of 30% by week 4 Date Initiated: 12/22/2018 Date Inactivated: 01/20/2019 Target Resolution Date: 01/23/2019 Goal Status: Unmet Unmet Reason: DM Ulcer/skin breakdown will have a volume reduction of 50% by week 8 Date Initiated: 01/20/2019 Date Inactivated: 02/24/2019 Target Resolution Date: 02/20/2019 Unmet Goal Status: Unmet Reason: comorobities Ulcer/skin breakdown will have a volume reduction of 80% by week 12 Date Initiated: 02/24/2019 Date Inactivated: 03/17/2019 Target Resolution Date: 03/27/2019 Unmet Goal Status: Unmet Reason: Osteomyelitis Ulcer/skin breakdown will heal within 14 weeks Date Initiated: 03/17/2019 Date Inactivated: 03/31/2019 Target Resolution Date: 04/03/2019 Unmet Goal Status: Unmet Reason: Osteomyelitis Interventions: Assess patient/caregiver ability to obtain necessary supplies Assess patient/caregiver ability to perform ulcer/skin care regimen upon admission and as needed Assess ulceration(s) every visit Provide education on ulcer and skin care Notes: Electronic Signature(s) Signed: 08/27/2019 6:31:45 PM By: Deon Pilling Entered By: Deon Pilling on 08/27/2019 14:01:30 -------------------------------------------------------------------------------- Pain Assessment Details Patient Name: Date of Service: KERY, BATZEL 08/27/2019 1:30 PM Medical Record [id number] Patient Account Number: [id number] Date of Birth/Sex: Treating RN: 1946/10/28 (73 y.o. Ernestene Mention Primary Care Kashtyn Jankowski: Allyn Kenner Other Clinician: Referring Kristinia Leavy: Treating Milicent Acheampong/Extender:[name], Ludger Nutting, Moses Manners in Treatment: 35 Active Problems Location of Pain Severity and Description of Pain Patient Has Paino No Site Locations Rate the pain. Current Pain Level: 0 Pain Management and Medication Current Pain  Management: Electronic Signature(s) Signed: 08/28/2019 7:05:19 PM By: Baruch Gouty RN, BSN Entered By: Baruch Gouty on 08/27/2019 13:39:53 -------------------------------------------------------------------------------- Patient/Caregiver Education Details Patient Name: Date of Service: Dene Gentry 10/1/2020andnbsp1:30 PM Medical Record [id number] Patient Account Number: [id number] Date of Birth/Gender: 12-08-1945 (73 y.o. M) Treating RN: Deon Pilling Primary Care Physician: Allyn Kenner Other Clinician: Referring Physician: Treating Physician/Extender:[name], Ludger Nutting, Moses Manners in Treatment: 75 Education Assessment Education Provided To: Patient Education Topics Provided Wound/Skin Impairment: Handouts: Skin Care Do's and Dont's Methods: Explain/Verbal Responses: Reinforcements needed Electronic Signature(s) Signed: 08/27/2019 6:31:45 PM By: Deon Pilling Entered By: Deon Pilling on 08/27/2019 13:52:16 -------------------------------------------------------------------------------- Wound Assessment Details Patient Name: Date of Service: TAY, WHITWELL 08/27/2019 1:30 PM Medical Record [id number] Patient Account Number: [id number] Date of Birth/Sex: Treating RN: [birth year] (73 y.o. Ernestene Mention Primary Care Lekisha Mcghee: Allyn Kenner Other Clinician: Referring Jamesrobert Ohanesian: Treating Halea Lieb/Extender:[name], Ludger Nutting, Moses Manners in Treatment: 35 Wound Status Wound Number: 2 Primary Diabetic Wound/Ulcer of the Lower Extremity Etiology: Wound Location: Left Foot - Medial Wound Healed - Epithelialized Wounding Event: Gradually Appeared Status: Date Acquired: 09/26/2018 Comorbid Sleep  Apnea, Type I Diabetes, Rheumatoid Weeks Of Treatment: 35 History: Arthritis, Osteomyelitis, Neuropathy, Seizure Clustered Wound: No Disorder Photos Wound Measurements Length: (cm) 0 % Reduction Width: (cm) 0 % Reduction Depth: (cm) 0 Epithelializ Area: (cm)  0 Tunneling: Volume: (cm) 0 Undermining Wound Description Classification: Grade 3 Foul Odor Af Wound Margin: Flat and Intact Slough/Fibri Exudate Amount: None Present Wound Bed Granulation Amount: None Present (0%) Necrotic Amount: None Present (0%) Fascia Expos Fat Layer (S Tendon Expos Muscle Expos Joint Expose Bone Exposed ter Cleansing: No no No Exposed Structure ed: No ubcutaneous Tissue) Exposed: No ed: No ed: No d: No : No in Area: 100% in Volume: 100% ation: Large (67-100%) No : No Electronic Signature(s) Signed: 08/28/2019 10:20:10 AM By: Benjaman Kindler EMT/HBOT Signed: 08/28/2019 7:05:19 PM By: Zenaida Deed RN, BSN Entered By: Benjaman Kindler on 08/28/2019 08:50:24 -------------------------------------------------------------------------------- Vitals Details Patient Name: Date of Service: Marica Otter D. 08/27/2019 1:30 PM Medical Record [id number] Patient Account Number: [id number] Date of Birth/Sex: Treating RN: 1946-03-05 (73 y.o. Damaris Schooner Primary Care Katryn Plummer: Nita Sells Other Clinician: Referring Tiena Manansala: Treating Keisa Blow/Extender:[name], [name], [name] in Treatment: 35 Vital Signs Time Taken: 13:38 Temperature (F): 98.1 Height (in): 67 Pulse (bpm): 83 Source: Stated Respiratory Rate (breaths/min): 18 Weight (lbs): 208 Blood Pressure (mmHg): 118/49 Source: Stated Capillary Blood Glucose (mg/dl): 696 Body Mass Index (BMI): 32.6 Reference Range: 80 - 120 mg / dl Notes glucose per pt report Electronic Signature(s) Signed: 08/28/2019 7:05:19 PM By: Zenaida Deed RN, BSN Entered By: Zenaida Deed on 08/27/2019 13:38:59

## 2019-09-10 ENCOUNTER — Other Ambulatory Visit: Payer: Self-pay

## 2019-09-10 ENCOUNTER — Encounter (HOSPITAL_BASED_OUTPATIENT_CLINIC_OR_DEPARTMENT_OTHER): Payer: Medicare Other | Attending: Internal Medicine | Admitting: Internal Medicine

## 2019-09-10 DIAGNOSIS — Z8631 Personal history of diabetic foot ulcer: Secondary | ICD-10-CM | POA: Diagnosis not present

## 2019-09-10 DIAGNOSIS — E1040 Type 1 diabetes mellitus with diabetic neuropathy, unspecified: Secondary | ICD-10-CM | POA: Diagnosis not present

## 2019-09-10 DIAGNOSIS — E10610 Type 1 diabetes mellitus with diabetic neuropathic arthropathy: Secondary | ICD-10-CM | POA: Insufficient documentation

## 2019-09-10 DIAGNOSIS — Z89511 Acquired absence of right leg below knee: Secondary | ICD-10-CM | POA: Diagnosis not present

## 2019-09-10 DIAGNOSIS — L84 Corns and callosities: Secondary | ICD-10-CM | POA: Insufficient documentation

## 2019-09-10 DIAGNOSIS — Z09 Encounter for follow-up examination after completed treatment for conditions other than malignant neoplasm: Secondary | ICD-10-CM | POA: Insufficient documentation

## 2019-09-11 NOTE — Progress Notes (Signed)
SHAQUEL, CHAVOUS ([id number]) Visit Report for 09/10/2019 HPI Details Patient Name: Date of Service: Andrew, Schultz 09/10/2019 1:30 PM Medical Record [id number] Patient Account Number: [id number] Date of Birth/Sex: Treating RN: Jun 19, 1946 (73 y.o. Andrew Schultz Primary Care Provider: Nita Sells Other Clinician: Referring Provider: Treating Provider/Extender:Andrew Schultz, Andrew Schultz, Andrew Schultz in Treatment: 37 History of Present Illness HPI Description: 12/10/16; this is a 72 year old man who is a type I diabetic with an insulin pump. He is a retired Scientist, clinical (histocompatibility and immunogenetics). 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 He also has a history of diabetic angiopathy although this was previously worked up in [place]. He was told he had small vessel disease they could not be further revascularized. 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 He tells me he was cared for for a period of time in [place] had a wound care center and had hyperbaric oxygen. The patient also has rheumatoid arthritis and is on methotrexate. The patient states he has been dealing with a wound on the right medial ankle since October. He has been applying Silvadene based dressings to this. He was seen in the ER on 10/31/16 at that point felt to have cellulitis and was given prescriptions for Augmentin and Bactrim. He was apparently given a podiatry follow-up. 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 There was diffuse soft tissue swelling about the ankle. He also had a duplex ultrasound of the right leg that was negative for DVT ABIs in this clinic were noncompressible 12/28/16; most of the information the patient brought back from  [place] was from 2013 at that point he had skin ulcers and suspicion of osteomyelitis. MRI of the right foot showed a Charcot foot he had osteomyelitis in the fourth and fifth metatarsals. He has had removal of the fifth metatarsal on the left foot on 6/24 2011 amputation of the right fourth and one half of the great toe amputation of the left second toe left fifth toe. He saw vascular surgery in 2011. At that point the had non-invasive studies showing tibial artery disease Doppler waveforms were within normal limits. There was no evidence of arterial insufficiency of the left leg the right great toe pressure was unable to be obtained secondary to an amputation of the left great toe pressure was above the healing index. It was not felt that he would be a candidate for intervention at that point he was felt to have microvascular blood supply issues. It is very clear he will need follow-up vascular studies and probably venous reflux studies is aware of the right leg. We also have information from his podiatric surgeons in [facility] x-ray of the right foot showed severe destruction of the ankle joint secondary to Charcot form deformity. There are Charcot changes visible to the ankle and midfoot joints. He was referred here. He has been using Santyl in the interim while he was traveling in [place] 01/04/17; not much change here. We have been using Santyl. He has arterial and venous studies next week asks that we can see him in 2 weeks which is reasonable. He had an ultrasound of his right leg rule out DVT in early December and they comment on no venous reflux however this was not a true reflux  study. He clearly has venous insufficiency 01/18/17; the patient has undergone his ordered studies. His VENOUS REFLUX studies show no evidence of a DVT or superficial vein thrombophlebitis. There is evidence of great saphenous vein reflux in the right lower extremity as well as reflux in the  common femoral and popliteal vein. No reflux was noted in the small saphenous vein on the right. ARTERIAL studies showed an ABI on the right of 1.54, waveforms were biphasic at the posterior tibial and dorsalis pedis on the right. He could not have a TBi on the right secondary to prior amputation. There was a suggestion of falsely elevated left TBI due to calcified vessel with a left TBI of 0.95. The patient will definitely need to see vascular surgery with regards to his arterial and venous systems. 02/01/17; appointment with vascular surgery on April 4. Still using Santyl. 02/15/17; the patient was changed to Prisma 2 weeks ago at the time the wound was small but with some depth but a healthy base. Apparently everything was going well to 2 or 3 days ago when the patient and his wife noted deterioration wound. He came in today on his usual appointment. He has vascular surgery appointment on April 4. Considerable deterioration today necrotic material,. Drainage and exposed bone. Mild degree of erythema around the wound which was marked. Change the primary dressing to silver alginate. Augmentin and doxycycline until culture comes back. This was done dark drainage. MRI of the foot is been ordered. He tells me that he has previously had osteomyelitis but I don't believe in this area 02/21/17; I saw this patient a week ago with a marked deterioration in the wound over his right medial malleolus. Culture at the time showed methicillin sensitive staph aureus. I've emperically put him on doxycycline on Augmentin last week. In the meantime he noted increasing swelling and drainage and was admitted to hospital from 3/25 through 3/27. He was given vancomycin however he was discharged on doxycycline for 10 days. He is still using silver alginate to the wound bed. He comes back today with a necrotic wound surface easily palpable friable necrotic bone underneath this. He has not been systemically unwell. He tells  me he is trying his best offload this area and walking minimally. He has not been systemically unwell. 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 There is chronic partial destruction of the distal tibia and fibula, calcaneus and navicular. The navicular is posteriorly displaced. The talus is not definitely seen either destroyed her previously resected. With regards the ulcer this was noted. The wound abuts on the medial malleolus there is thick periosteal thickening but no gross cortical destruction there is a complex fluid collection at this aortic relation between the distal tibia and the calcaneus the feeling was that this was not osteomyelitis given the extensive Charcot changes but osteomyelitis could not definitely be excluded 03/01/17- patient is here for follow-up evaluation of his right medial foot/ankle ulceration. Did have tissue culture and bone pathology sent last week. Bone pathology reveals chronic osteomyelitis, no malignancy. The tissue culture resulted oxacillin sensitive staph aureus. He is currently on Keflex. 03/04/17; the patient's pathology showed chronic osteomyelitis. Bone culture revealed methicillin sensitive staph aureus he is on Keflex. He arrives today in clinic with a piece of bone protruding out of the wound 03/11/17; with considerable difficulty and help of our staff 5 min able to get the patient appointment on Thursday with infectious disease and subtotally next  Monday morning with Dr. Bennett Scrape podiatric ankle surgeon with [facility] with regards to the Charcot right ankle joint and underlying infection. He has been using silver alginate and remains on Keflex 03/18/17; the patient saw Dr. Bennett Scrape who outlined to surgical options 1 a below-knee amputation and the other a debridement of the underlying fibula possibly part of the tibia with an external fixator. Pending the patient is leaning towards the amputation as  even if this could possibly result in a functional limb he would be at risk for further Charcot complications in the future. We are using silver alginate to the wound and continuing Keflex. If he opts for an aggressive posture to attempt to save his leg then he'll probably need IV antibiotics and could be considered for hyperbaric oxygen however I doubt that's what he'll decide given his tone today READMISSION 12/22/2018 This is a patient with type 1 diabetes, severe peripheral neuropathy and severe Charcot foot and ankle deformities. We had him here in 2018 actually looking an area on the right foot. This did not heal in fact became worse and secondarily infected. He saw Dr. Bennett Scrape of podiatric surgery at [facility]. 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 He has a prosthesis and was doing well. He tells me that late in October he developed a small blister on the medial aspect of his left first metatarsal head. He was followed by podiatry initially with Iodosorb and then 2 weeks ago with Santyl to the wound area. His wife changes the dressings. 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 Since he was last here he had a left third toe amputation which healed well. ABIs in this clinic on the left were noncompressible 2/10; the patient's area on the medial aspect of the left great toe. Since the last time the patient was here 2 weeks ago this is actually deteriorated now with exposed tendon. He has been using Santyl. The patient had arterial studies done in February 2018. On the left this showed noncompressible vessels bilaterally but  with a TBI of 0.95. Waveforms were biphasic at the posterior tibial and dorsalis pedis. 2/17; x-ray from last week showed the third toe amputation. Medial soft tissue ulceration without evidence of osteomyelitis. We have been using Santyl. His arterial studies to compare with the ones done 2 years ago are tomorrow. Run Grafix PL through his insurance 2/25. Arterial studies were done on 2/18. On the left this showed noncompressible vessels but with triphasic wave waveforms at the PTA and biphasic at the dorsalis pedis. TBI was 0.55 down from 0.95 2 years ago. His wound is on the medial left first metatarsal head. This does not have a viable surface dry and exposed tendon. 3/10/; patient did not have his vascular consult done because of an error in sending the orders. This will be done today. He arrives today with some erythema around the wound nothing but exposed tendon. I have been keeping this moist with hydrogel and silver collagen. He will need an MRI a vascular surgery consult. 3/24; he still not has not seen vascular surgery however he was admitted to hospital from 3/12 through 02/07/2019. He was felt to have  acute osteomyelitis of the left foot. He was seen by his podiatrist at Dr. [name]. There were no cultures done. He was initially treated with vancomycin cefepime and Flagyl however discharged on vancomycin and Augmentin. He has MRI documented septic arthritis and osteomyelitis of the first metatarsal head and neck. The patient arrived in clinic with a sheet of concerns predominantly that [facility] home care did not have orders beyond Thursday morning. I E as antibiotics would stop. 3/31; he is now on vancomycin and ertapenem as directed by infectious disease. We have everybody on board. He is going for his angiogram tomorrow although I have not yet reviewed vein and vascular's notes. We have been using Santyl on the wound bed 4/7; he apparently had some renal insufficiency issues and his  vancomycin was temporarily on hold. He did have his angiogram done on 4/1; this did not show any significant stenosis. There was three-vessel runoff. The dominant vessel was the anterior tibial. There was some evidence of digital arterial atherosclerotic disease but certainly nothing that required an intervention from a macrovascular point of view. In preparation for possible HBO I am going to order a hemoglobin A1c. Also notable that he has a low albumin level. He has had a reduction in his appetite which he is attributing to antibiotic therapy. 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 I will need to research what we know about this on Bailey link i.e. has he ever had urine studies 4/14; his vancomycin is still on hold as far as the patient is aware. I find this somewhat unusual unless there is more to this than I realized. We have been using Santyl to the wound 4/21; patient seen in conjunction with HBO. I did get some lab work from home health. On 4/31 his vancomycin level was in the 90s. Essentially it is been on hold since then. Lab work that was sent to me today dated 4/20 shows a vancomycin level of 17.8. Fortunately his BUN and creatinine are both normal at 20 and 1.0. His white count is 5.6 hemoglobin 11.5. I also have from 4/17 a sedimentation rate of 96 and a C-reactive protein of 20 although the patient has rheumatoid arthritis. He also tells me that his methotrexate and biologic agent have been put on hold by his rheumatologist because of the underlying osteomyelitis. Finally I am not exactly sure who is following his antibiotics after discussion with the patient today. I did involve Dr. Orvan Falconerampbell via secure text I think when it became clear that his antibiotics are running out after his hospitalization. I also discussed things with his primary doctor Dr. Dwana MelenaZack Hall in [place]. But I am not exactly sure who is following the labs. He has been tolerating HBO  well. 4/28; the patient is tolerating HBO well. There is been improvements in his wound dimension especially the undermining at 1:00 towards the medial foot. Today measured at 1.2 cm. We have been in contact with home health. The antibiotics finished on Sunday. He still has his PICC line in place. C-reactive protein and sedimentation rate were measured yesterday. His methotrexate and biologic agent are still on hold he has not had any trouble with increasing pain from his RA 5/5; the patient is tolerating HBO well. He is seen today in conjunction with that his wound is improved had been improving although it is not much different from last week. Undermining towards the medial foot today measured at 1.8 up from 1.2 last week. Went to see  Dr. Luciana Axe of ophthalmology. Fortunately did not have anything major wrong. Most of the symptoms he was having in his eyes were related to pseudophakia. He feels better today and he tolerated his treatment well. We are using endoform. Previously his insurance which is through [place] [employer] had refused them for Grafix. I am going to run Dermagraft I also looked over his inflammatory markers his sed rate is still over 100. I do not know how to interpret this in this patient who has rheumatoid arthritis. I am not sure that the persistence of an elevated sed rate in this man means of treatment failure 5/12; the patient was seen today for wound care evaluation in conjunction with hyperbarics that he is tolerating well. We have been using endoform. The undermining area to along the medial aspect of his foot and come down nicely however on our evaluation today that is gone back up to 4 cm which is really very disappointing. Other than that the wound bed paradoxically looks somewhat better 5/19- Patient seen after HBO, the ulcer now undermines to 3.5 cm which is better than 4.0 last time 5/26; patient was seen after HBO. He apparently had a single Dermagraft applied  last week. I applied Dermagraft No. 2. Careful attention to the tunneling along the medial foot which currently measures 3.5 cm about the same as last time 6/2; patient was seen after HBO. Dermagraft No. 3 applied. Tunneling area improved condition of the wound bed and improved. 6/9; patient was seen after HBO. The tunneling area is about 1 cm about the same as last time. Dermagraft No. 4 applied. We are also going to recertify him for hyperbarics which is really seems to have helped the wound healing process. 6/16; patient was seen after HBO. The tunneling area has deepened this week towards the medial part of his foot but that had come in last week. Nevertheless the wound bed looks healthy. Dermagraft No. 5 applied after debridement 6/23. Patient was seen after HBO tunneling area is down from 4 to 3.6 cm. I think we have had some contraction of the surface area. There is no palpable bone. No evidence of infection. Dermagraft No. 6 applied 6/30; the tunneling expanded once again to 7 cm. Yet the wound itself looks actually quite good. Dermagraft No. 7 7/7; tunneling at 4.5. Wound dimensions are down from last week especially in width. Dermagraft No. 8 7/14; the tunneling is down in the wound has less with. What I can see the base of the wound looks healthy. We will change him to endoform AG. He continues along with HBO we are making progress albeit slow 7/21; tunneling in the most proximal part of this wound is at 1 cm. The rest of this is down to a linear slitlike area. Not much with. No evidence of infection 7/28; the tunneling and the most proximal part of this wound is down to 0.3 cm measured by myself otherwise it remains a linear slitlike area. The base of this appears to be healthy. No evidence of infection. He is seen today along with hyperbarics 8/11-Left medial foot wound has covering of eschar-like tissue but it is otherwise small opening. 8/18; left medial foot using endoform.  Small opening what I can see of the base of this looks healthy. Most impressively the tunneling area towards the medial part of his foot is close down there is some undermining from roughly 1:00 to 4:00 of roughly 2 mm. 8/25; continued improvement using endoform. There is no tunneling  or undermining that I could identify. 9/1; small slitlike wound on the left medial part of his foot. Very difficult to fully examine this what I can see of the wound bed looks healthy. Using endoform 9/17; small slitlike wound on the left medial part of his foot. We had received a call earlier this week from the patient stating that the wound had deteriorated. There was apparently some malodorous drainage but it is not recurred. We could not get him into the clinic until today. There really is not much change in the wound perhaps slightly shallower. We have been using endoform, he has been using the pieces we had left over. 10/1; small slitlike wound on the left medial part of his foot. There is some callus over the area but no obvious opening. I changed him to Iodoflex last visit 2 weeks ago 10/15; wound on the left medial part of his foot. Once again there is callus over this area which I gently probed with a #5 curette but there is no open area. Electronic Signature(s) Signed: 09/10/2019 5:35:18 PM By: Baltazar Najjar MD Entered By: Baltazar Najjar on 09/10/2019 14:37:44 -------------------------------------------------------------------------------- Physical Exam Details Patient Name: Date of Service: Andrew Schultz 09/10/2019 1:30 PM Medical Record [id number] Patient Account Number: [id number] Date of Birth/Sex: Treating RN: [birth year] (73 y.o. Andrew Schultz Primary Care Provider: Nita Sells Other Clinician: Referring Provider: Treating Provider/Extender:Jaamal Farooqui, Andrew Schultz, Andrew Schultz in Treatment: 37 Constitutional Sitting or standing Blood Pressure is within target range for patient..  Pulse regular and within target range for patient.Marland Kitchen Respirations regular, non-labored and within target range.. Temperature is normal and within the target range for the patient.Marland Kitchen Appears in no distress. Notes Wound exam; left medial foot eschared area. Using a #5 curette I gently remove some of the eschar but I did not find anything that was open here. I therefore think we can heal them out from the clinic there is no drainage no erythema. Electronic Signature(s) Signed: 09/10/2019 5:35:18 PM By: Baltazar Najjar MD Entered By: Baltazar Najjar on 09/10/2019 14:38:41 -------------------------------------------------------------------------------- Physician Orders Details Patient Name: Date of Service: Andrew Schultz 09/10/2019 1:30 PM Medical Record [id number] Patient Account Number: [id number] Date of Birth/Sex: Treating RN: 1945/12/10 (73 y.o. Andrew Schultz Primary Care Provider: Nita Sells Other Clinician: Referring Provider: Treating Provider/Extender:Ramond Darnell, Andrew Schultz, Andrew Schultz in Treatment: [phone] Verbal / Phone Orders: No Diagnosis Coding ICD-10 Coding Code Description [id number] Subacute osteomyelitis, left ankle and foot L97.524 Non-pressure chronic ulcer of other part of left foot with necrosis of bone E10.621 Type 1 diabetes mellitus with foot ulcer E10.40 Type 1 diabetes mellitus with diabetic neuropathy, unspecified M14.672 Charcot's joint, left ankle and foot Z89.511 Acquired absence of right leg below knee [facility]charge From [facility] Discharge from W[facility] Care Center Secondary Dressing Other: - pad healed area well for protection. Edema Control Avoid standing for long periods of time Elevate legs to the level of the heart or above for 30 minutes daily and/or when sitting, a frequency of: - throughout the day. Off-Loading Open toe surgical shoe to: - continue to wear. Electronic Signature(s) Signed: 09/10/2019 5:35:18 PM By: Baltazar Najjar MD Signed:  09/11/2019 6:00:55 PM By: Zandra Abts RN, BSN Entered By: Zandra Abts on 09/10/2019 14:25:34 -------------------------------------------------------------------------------- Problem List Details Patient Name: Date of Service: Andrew Schultz 09/10/2019 1:30 PM Medical Record [id number] Patient Account Number: [id number] Date of Birth/Sex: Treating RN: September 12, 1946 (73 y.o. Andrew Schultz Primary Care Provider: Nita Sells Other Clinician: Referring Provider: Treating Provider/Extender:Jerilynn Feldmeier,  Andrew Schultz, Blanche Weeks in Treatment: 37 Active Problems ICD-10 Evaluated Encounter Code Description Active Date Today Diagnosis [phone] Subacute osteomyelitis, left ankle and foot 02/17/2019 No Yes L97.524 Non-pressure chronic ulcer of other part of left foot 02/17/2019 No Yes with necrosis of bone E10.621 Type 1 diabetes mellitus with foot ulcer 12/22/2018 No Yes E10.40 Type 1 diabetes mellitus with diabetic neuropathy, 12/22/2018 No Yes unspecified M14.672 Charcot's joint, left ankle and foot 12/22/2018 No Yes Z89.511 Acquired absence of right leg below knee 12/22/2018 No Yes Inactive Problems Resolved Problems Electronic Signature(s) Signed: 09/10/2019 5:35:18 PM By: Baltazar Najjar MD Entered By: Baltazar Najjar on 09/10/2019 14:36:47 -------------------------------------------------------------------------------- Progress Note Details Patient Name: Date of Service: Andrew Otter D. 09/10/2019 1:30 PM Medical Record [id number] Patient Account Number: [id number] Date of Birth/Sex: Treating RN: 01-25-1946 (73 y.o. 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 He is a retired Scientist, clinical (histocompatibility and immunogenetics). 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 He also has a history of diabetic angiopathy although this was previously worked up in [place]. He was told he had small vessel disease they could not be further revascularized. 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 He tells me he was cared for for a period of time in [place] had a wound care center and had hyperbaric oxygen. The patient also has rheumatoid arthritis and is on methotrexate. The patient states he has been dealing with a wound on the right medial ankle since October. He has been applying Silvadene based dressings to this. He was seen in the ER on 10/31/16 at that point felt to have cellulitis and was given prescriptions for Augmentin and Bactrim. He was apparently given a podiatry follow-up. 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 There was diffuse soft tissue swelling about the ankle. He also had a duplex ultrasound of the right leg that was negative for DVT ABIs in this clinic were noncompressible 12/28/16; most of the information the patient brought back from [place] was from 2013 at that point he had skin ulcers and suspicion of osteomyelitis. MRI of the right foot showed a Charcot foot he had osteomyelitis in the fourth and fifth metatarsals. He has had removal of the fifth metatarsal on the left foot on 6/24 2011 amputation of the right fourth and one half of the great toe amputation of the left second toe left fifth toe. He saw vascular surgery in 2011. At that point the had non-invasive studies showing tibial artery disease Doppler waveforms were within normal limits. There was no evidence of arterial insufficiency of the left leg the right great toe pressure was unable  to be obtained secondary to an amputation of the left great toe pressure was above the healing index. It was not felt that he would be a candidate for intervention at that point he was felt to have microvascular blood supply issues. It is very clear he will need follow-up vascular studies and probably venous reflux studies is aware of the right leg. We also have information from his podiatric surgeons in [facility]  [place] x-ray of the right foot showed severe destruction of the ankle joint secondary to Charcot form deformity. There are Charcot changes visible to the ankle and midfoot joints. He was referred here. He has been using Santyl in the interim while he was traveling in [place] 01/04/17; not much change here. We have been using Santyl. He has arterial and venous studies next week asks that we can see him in 2 weeks which is reasonable. He had an ultrasound of his right leg rule out DVT in early December and they comment on no venous reflux however this was not a true reflux study. He clearly has venous insufficiency 01/18/17; the patient has undergone his ordered studies. His VENOUS REFLUX studies show no evidence of a DVT or superficial vein thrombophlebitis. There is evidence of great saphenous vein reflux in the right lower extremity as well as reflux in the common femoral and popliteal vein. No reflux was noted in the small saphenous vein on the right. ARTERIAL studies showed an ABI on the right of 1.54, waveforms were biphasic at the posterior tibial and dorsalis pedis on the right. He could not have a TBi on the right secondary to prior amputation. There was a suggestion of falsely elevated left TBI due to calcified vessel with a left TBI of 0.95. The patient will definitely need to see vascular surgery with regards to his arterial and venous systems. 02/01/17; appointment with vascular surgery on April 4. Still using Santyl. 02/15/17; the patient was changed to Prisma 2 weeks  ago at the time the wound was small but with some depth but a healthy base. Apparently everything was going well to 2 or 3 days ago when the patient and his wife noted deterioration wound. He came in today on his usual appointment. He has vascular surgery appointment on April 4. Considerable deterioration today necrotic material,. Drainage and exposed bone. Mild degree of erythema around the wound which was marked. Change the primary dressing to silver alginate. Augmentin and doxycycline until culture comes back. This was done dark drainage. MRI of the foot is been ordered. He tells me that he has previously had osteomyelitis but I don't believe in this area 02/21/17; I saw this patient a week ago with a marked deterioration in the wound over his right medial malleolus. Culture at the time showed methicillin sensitive staph aureus. I've emperically put him on doxycycline on Augmentin last week. In the meantime he noted increasing swelling and drainage and was admitted to hospital from 3/25 through 3/27. He was given vancomycin however he was discharged on doxycycline for 10 days. He is still using silver alginate to the wound bed. He comes back today with a necrotic wound surface easily palpable friable necrotic bone underneath this. He has not been systemically unwell. He tells me he is trying his best offload this area and walking minimally. He has not been systemically unwell. 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 There is chronic partial destruction of the distal tibia and fibula, calcaneus and navicular. The navicular is posteriorly displaced. The talus is not definitely seen either destroyed her previously resected. With regards the ulcer this was noted. The wound abuts on the medial malleolus there is thick periosteal thickening but no gross cortical destruction there is a complex fluid collection at this aortic relation  between the distal tibia and the calcaneus the feeling was that this was not osteomyelitis given the extensive Charcot changes but osteomyelitis could  not definitely be excluded 03/01/17- patient is here for follow-up evaluation of his right medial foot/ankle ulceration. Did have tissue culture and bone pathology sent last week. Bone pathology reveals chronic osteomyelitis, no malignancy. The tissue culture resulted oxacillin sensitive staph aureus. He is currently on Keflex. 03/04/17; the patient's pathology showed chronic osteomyelitis. Bone culture revealed methicillin sensitive staph aureus he is on Keflex. He arrives today in clinic with a piece of bone protruding out of the wound 03/11/17; with considerable difficulty and help of our staff 5 min able to get the patient appointment on Thursday with infectious disease and subtotally next Monday morning with Dr. Bennett Scrape podiatric ankle surgeon with [facility] with regards to the Charcot right ankle joint and underlying infection. He has been using silver alginate and remains on Keflex 03/18/17; the patient saw Dr. Bennett Scrape who outlined to surgical options 1 a below-knee amputation and the other a debridement of the underlying fibula possibly part of the tibia with an external fixator. Pending the patient is leaning towards the amputation as even if this could possibly result in a functional limb he would be at risk for further Charcot complications in the future. We are using silver alginate to the wound and continuing Keflex. If he opts for an aggressive posture to attempt to save his leg then he'll probably need IV antibiotics and could be considered for hyperbaric oxygen however I doubt that's what he'll decide given his tone today READMISSION 12/22/2018 This is a patient with type 1 diabetes, severe peripheral neuropathy and severe Charcot foot and ankle deformities. We had him here in 2018 actually looking an area on the right foot. This did not heal  in fact became worse and secondarily infected. He saw Dr. Bennett Scrape of podiatric surgery at [facility]. 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 He has a prosthesis and was doing well. He tells me that late in October he developed a small blister on the medial aspect of his left first metatarsal head. He was followed by podiatry initially with Iodosorb and then 2 weeks ago with Santyl to the wound area. His wife changes the dressings. 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 Since he was last here he had a left third toe amputation which healed well. ABIs in this clinic on the left were noncompressible 2/10; the patient's area on the medial aspect of the left great toe. Since the last time the patient was here 2 weeks ago this is actually deteriorated now with exposed tendon. He has been using Santyl. The patient had arterial studies done in February 2018. On the left this showed noncompressible vessels bilaterally but with a TBI of 0.95. Waveforms were biphasic at the posterior tibial and dorsalis pedis. 2/17; x-ray from last week showed the third toe amputation. Medial soft tissue ulceration without evidence of osteomyelitis. We have been using Santyl. His arterial studies to compare with the ones done 2 years ago are tomorrow. Run Grafix PL through his insurance 2/25. Arterial studies were done on 2/18. On the left this showed noncompressible vessels but with triphasic wave waveforms at the PTA and biphasic at the dorsalis pedis. TBI was 0.55 down from  0.95 2 years ago. His wound is on the medial left first metatarsal head. This does  not have a viable surface dry and exposed tendon. 3/10/; patient did not have his vascular consult done because of an error in sending the orders. This will be done today. He arrives today with some erythema around the wound nothing but exposed tendon. I have been keeping this moist with hydrogel and silver collagen. He will need an MRI a vascular surgery consult. 3/24; he still not has not seen vascular surgery however he was admitted to hospital from 3/12 through 02/07/2019. He was felt to have acute osteomyelitis of the left foot. He was seen by his podiatrist at Dr. Allena Katz. There were no cultures done. He was initially treated with vancomycin cefepime and Flagyl however discharged on vancomycin and Augmentin. He has MRI documented septic arthritis and osteomyelitis of the first metatarsal head and neck. The patient arrived in clinic with a sheet of concerns predominantly that [facility] home [facility] did not have orders beyond Thursday morning. I E as antibiotics would stop. 3/31; he is now on vancomycin and ertapenem as directed by infectious disease. We have everybody on board. He is going for his angiogram tomorrow although I have not yet reviewed vein and vascular's notes. We have been using Santyl on the wound bed 4/7; he apparently had some renal insufficiency issues and his vancomycin was temporarily on hold. He did have his angiogram done on 4/1; this did not show any significant stenosis. There was three-vessel runoff. The dominant vessel was the anterior tibial. There was some evidence of digital arterial atherosclerotic disease but certainly nothing that required an intervention from a macrovascular point of view. In preparation for possible HBO I am going to order a hemoglobin A1c. Also notable that he has a low albumin level. He has had a reduction in his appetite which he is attributing to antibiotic therapy. 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 I will need  to research what we know about this on [place] link i.e. has he ever had urine studies 4/14; his vancomycin is still on hold as far as the patient is aware. I find this somewhat unusual unless there is more to this than I realized. We have been using Santyl to the wound 4/21; patient seen in conjunction with HBO. I did get some lab work from home health. On 4/31 his vancomycin level was in the 90s. Essentially it is been on hold since then. Lab work that was sent to me today dated 4/20 shows a vancomycin level of 17.8. Fortunately his BUN and creatinine are both normal at 20 and 1.0. His white count is 5.6 hemoglobin 11.5. I also have from 4/17 a sedimentation rate of 96 and a C-reactive protein of 20 although the patient has rheumatoid arthritis. He also tells me that his methotrexate and biologic agent have been put on hold by his rheumatologist because of the underlying osteomyelitis. Finally I am not exactly sure who is following his antibiotics after discussion with the patient today. I did involve Dr. Orvan Falconer via secure text I think when it became clear that his antibiotics are running out after his hospitalization. I also discussed things with his primary doctor Dr. Dwana Melena in [place]. But I am not exactly sure who is following the labs. He has been tolerating HBO well. 4/28; the patient is tolerating HBO well. There is been improvements in his wound dimension especially the undermining at 1:00 towards the medial  foot. Today measured at 1.2 cm. We have been in contact with home health. The antibiotics finished on Sunday. He still has his PICC line in place. C-reactive protein and sedimentation rate were measured yesterday. His methotrexate and biologic agent are still on hold he has not had any trouble with increasing pain from his RA 5/5; the patient is tolerating HBO well. He is seen today in conjunction with that his wound is improved had been improving although it is not  much different from last week. Undermining towards the medial foot today measured at 1.8 up from 1.2 last week. Went to see Dr. Luciana Axe of ophthalmology. Fortunately did not have anything major wrong. Most of the symptoms he was having in his eyes were related to pseudophakia. He feels better today and he tolerated his treatment well. We are using endoform. Previously his insurance which is through [place] [employer] had refused them for Grafix. I am going to run Dermagraft I also looked over his inflammatory markers his sed rate is still over 100. I do not know how to interpret this in this patient who has rheumatoid arthritis. I am not sure that the persistence of an elevated sed rate in this man means of treatment failure 5/12; the patient was seen today for wound care evaluation in conjunction with hyperbarics that he is tolerating well. We have been using endoform. The undermining area to along the medial aspect of his foot and come down nicely however on our evaluation today that is gone back up to 4 cm which is really very disappointing. Other than that the wound bed paradoxically looks somewhat better 5/19- Patient seen after HBO, the ulcer now undermines to 3.5 cm which is better than 4.0 last time 5/26; patient was seen after HBO. He apparently had a single Dermagraft applied last week. I applied Dermagraft No. 2. Careful attention to the tunneling along the medial foot which currently measures 3.5 cm about the same as last time 6/2; patient was seen after HBO. Dermagraft No. 3 applied. Tunneling area improved condition of the wound bed and improved. 6/9; patient was seen after HBO. The tunneling area is about 1 cm about the same as last time. Dermagraft No. 4 applied. We are also going to recertify him for hyperbarics which is really seems to have helped the wound healing process. 6/16; patient was seen after HBO. The tunneling area has deepened this week towards the medial part of his  foot but that had come in last week. Nevertheless the wound bed looks healthy. Dermagraft No. 5 applied after debridement 6/23. Patient was seen after HBO tunneling area is down from 4 to 3.6 cm. I think we have had some contraction of the surface area. There is no palpable bone. No evidence of infection. Dermagraft No. 6 applied 6/30; the tunneling expanded once again to 7 cm. Yet the wound itself looks actually quite good. Dermagraft No. 7 7/7; tunneling at 4.5. Wound dimensions are down from last week especially in width. Dermagraft No. 8 7/14; the tunneling is down in the wound has less with. What I can see the base of the wound looks healthy. We will change him to endoform AG. He continues along with HBO we are making progress albeit slow 7/21; tunneling in the most proximal part of this wound is at 1 cm. The rest of this is down to a linear slitlike area. Not much with. No evidence of infection 7/28; the tunneling and the most proximal part of this wound is  down to 0.3 cm measured by myself otherwise it remains a linear slitlike area. The base of this appears to be healthy. No evidence of infection. He is seen today along with hyperbarics 8/11-Left medial foot wound has covering of eschar-like tissue but it is otherwise small opening. 8/18; left medial foot using endoform. Small opening what I can see of the base of this looks healthy. Most impressively the tunneling area towards the medial part of his foot is close down there is some undermining from roughly 1:00 to 4:00 of roughly 2 mm. 8/25; continued improvement using endoform. There is no tunneling or undermining that I could identify. 9/1; small slitlike wound on the left medial part of his foot. Very difficult to fully examine this what I can see of the wound bed looks healthy. Using endoform 9/17; small slitlike wound on the left medial part of his foot. We had received a call earlier this week from the patient stating that the  wound had deteriorated. There was apparently some malodorous drainage but it is not recurred. We could not get him into the clinic until today. There really is not much change in the wound perhaps slightly shallower. We have been using endoform, he has been using the pieces we had left over. 10/1; small slitlike wound on the left medial part of his foot. There is some callus over the area but no obvious opening. I changed him to Iodoflex last visit 2 weeks ago 10/15; wound on the left medial part of his foot. Once again there is callus over this area which I gently probed with a #5 curette but there is no open area. Objective Constitutional Sitting or standing Blood Pressure is within target range for patient.. Pulse regular and within target range for patient.Marland Kitchen Respirations regular, non-labored and within target range.. Temperature is normal and within the target range for the patient.Marland Kitchen Appears in no distress. Vitals Time Taken: 2:04 PM, Height: 67 in, Weight: 208 lbs, BMI: 32.6, Temperature: 98 F, Pulse: 86 bpm, Respiratory Rate: 18 breaths/min, Blood Pressure: 100/60 mmHg, Capillary Blood Glucose: 78 mg/dl. General Notes: CBG per patient General Notes: Wound exam; left medial foot eschared area. Using a #5 curette I gently remove some of the eschar but I did not find anything that was open here. I therefore think we can heal them out from the clinic there is no drainage no erythema. 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 Edema Control: Avoid standing for  long periods of time Elevate legs to the level of the heart or above for 30 minutes daily and/or when sitting, a frequency of: - throughout the day. Off-Loading: Open toe surgical shoe to: - continue to wear. 1. We have healed this wound out 2. Again went over the concept of "closed" versus" healed". Advised to call us if there is any changes in the area 3. For now advised to continue to offload this in a surgical shoe. Even when he graduates into his own shoe in about a month we have advised him to keep this area padded. 4. He is discharged from the wound clinic. At one point extensive  osteomyelitis. He completed IV antibiotics and hyperbaric Electronic Signature(s) Signed: 09/10/2019 5:35:18 PM By: Linton Ham MD Entered By: Linton Ham on 09/10/2019 14:39:59 -------------------------------------------------------------------------------- SuperBill Details Patient Name: Date of Service: Dene Gentry 09/10/2019 Medical Record [id number] Patient Account Number: [id number] Date of Birth/Sex: Treating RN: [birth year] (73 y.o. Janyth Contes Primary Care Provider: Allyn Kenner Other Clinician: Referring Provider: Treating Provider/Extender:Travanti Mcmanus, Ludger Nutting, Moses Manners in Treatment: 37 Diagnosis Coding ICD-10 Codes Code Description [phone] Subacute osteomyelitis, left ankle and foot L97.524 Non-pressure chronic ulcer of other part of left foot with necrosis of bone E10.621 Type 1 diabetes mellitus with foot ulcer E10.40 Type 1 diabetes mellitus with diabetic neuropathy, unspecified M14.672 Charcot's joint, left ankle and foot Z89.511 Acquired absence of right leg below knee Facility Procedures The patient participates with Medicare or their insurance follows the Medicare Facility Guidelines: CPT4 Code Description Modifier Quantity [id number] [phone] - WOUND CARE VISIT-LEV 2 EST PT 1 Physician Procedures CPT4 Code Description: [id number] - WC PHYS LEVEL 2 - EST PT  ICD-10 Diagnosis Description L97.524 Non-pressure chronic ulcer of other part of left foot w E10.621 Type 1 diabetes mellitus with foot ulcer M86.272 Subacute osteomyelitis, left ankle and  foot Modifier: ith necrosis of Quantity: 1 bone Electronic Signature(s) Signed: 09/11/2019 5:33:39 PM By: Linton Ham MD Signed: 09/11/2019 6:00:55 PM By: Levan Hurst RN, BSN Previous Signature: 09/10/2019 5:35:18 PM Version By: Linton Ham MD Entered By: Levan Hurst on 09/10/2019 18:14:27

## 2019-10-27 IMAGING — DX DG FOOT COMPLETE 3+V*L*
3 series · 3 of 3 positions shown · non-contrast
Comparison: No recent prior.

CLINICAL DATA: Ulcer left foot.

EXAM:
LEFT FOOT - COMPLETE 3+ VIEW

[foot ap]
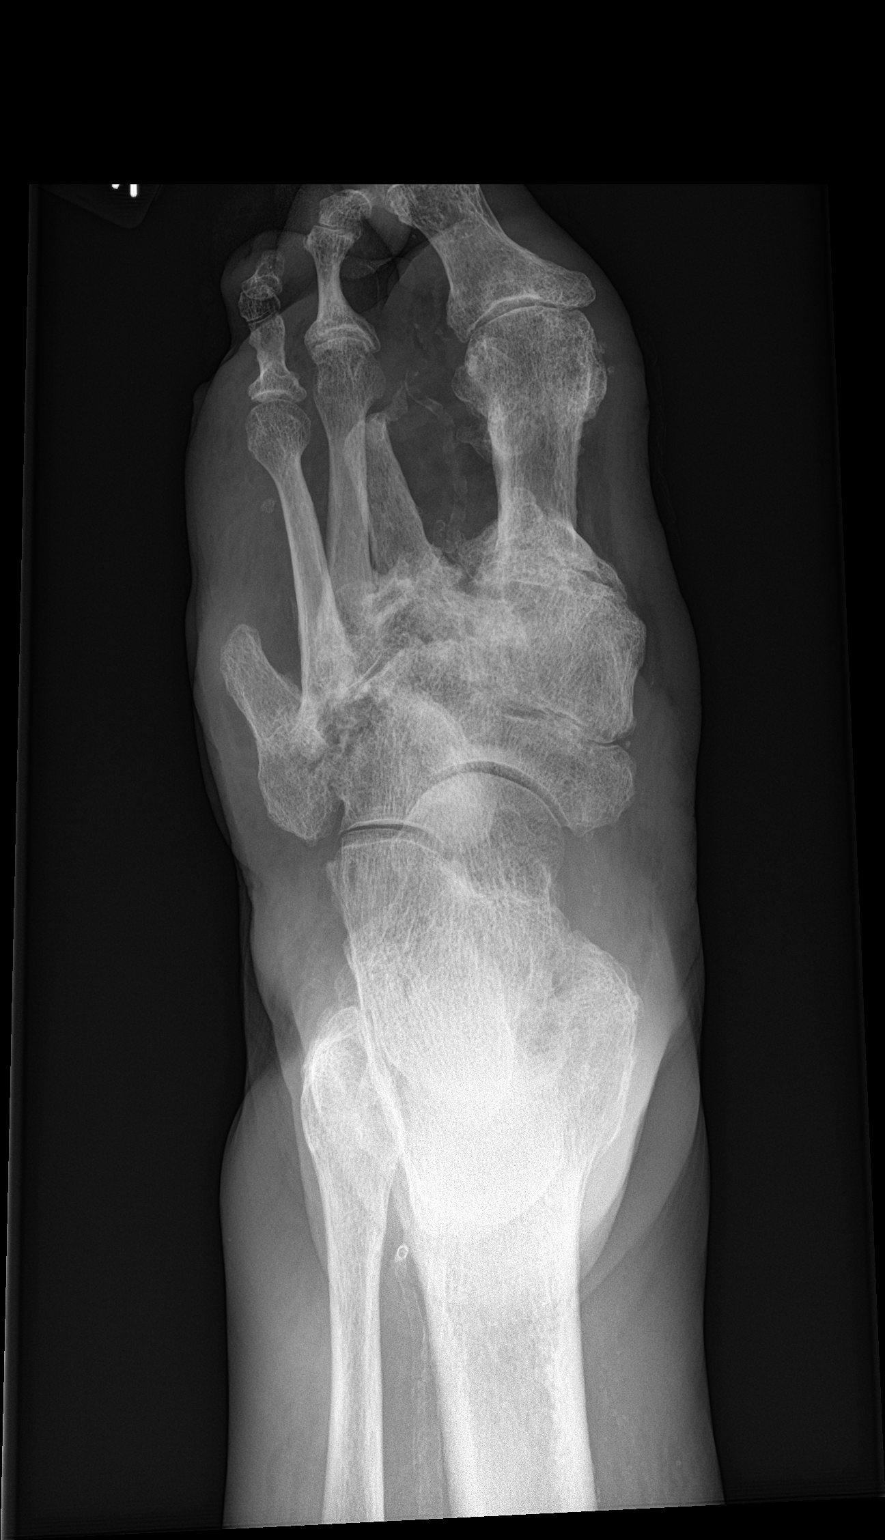

[foot obl]
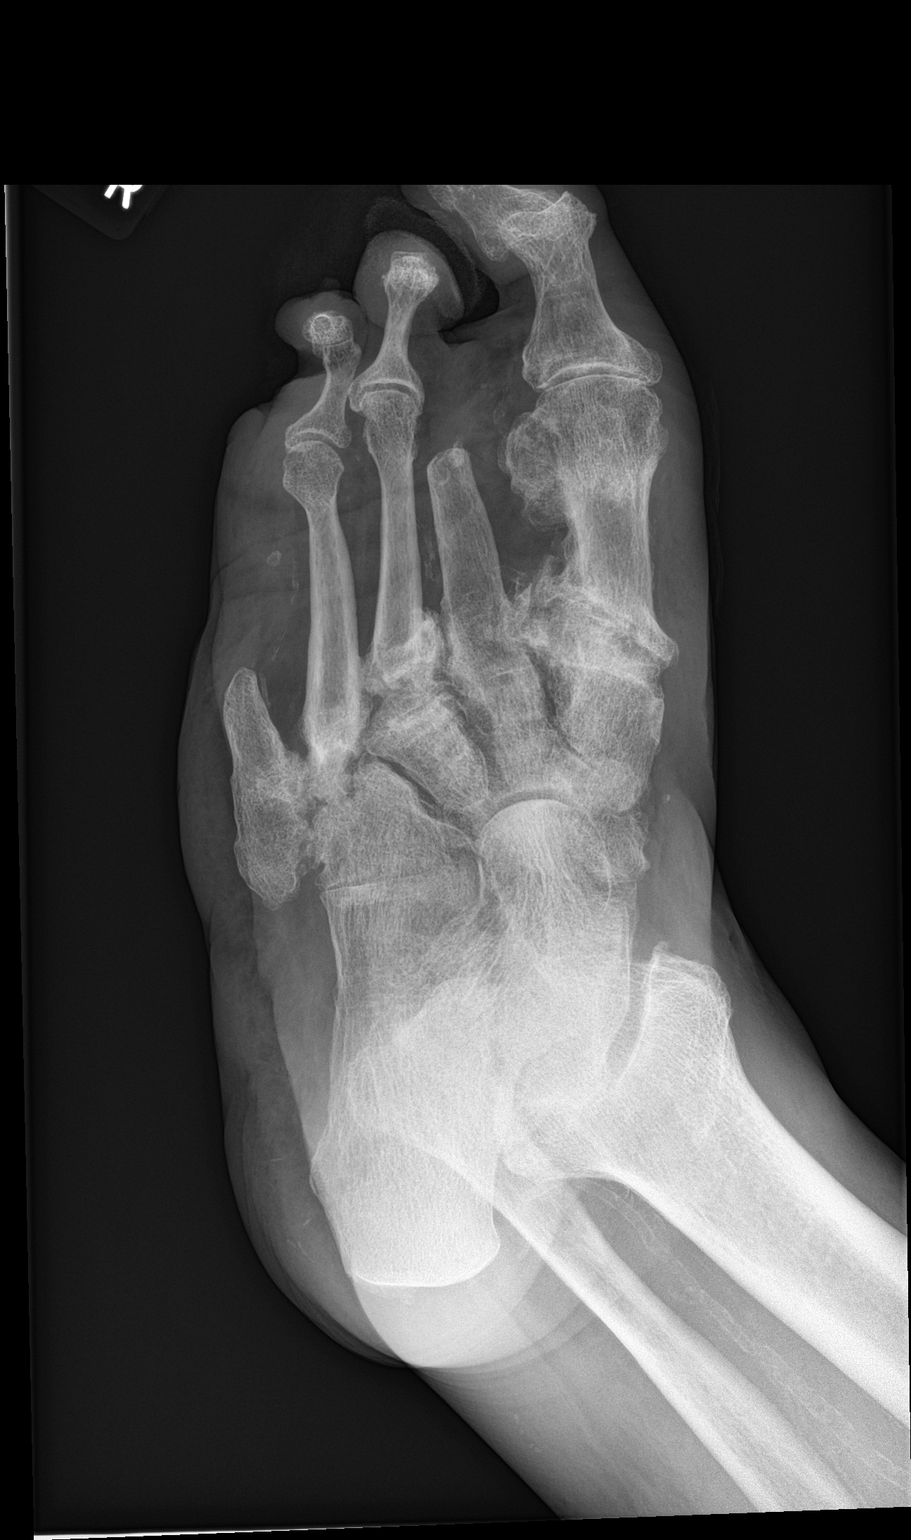

[foot lat]
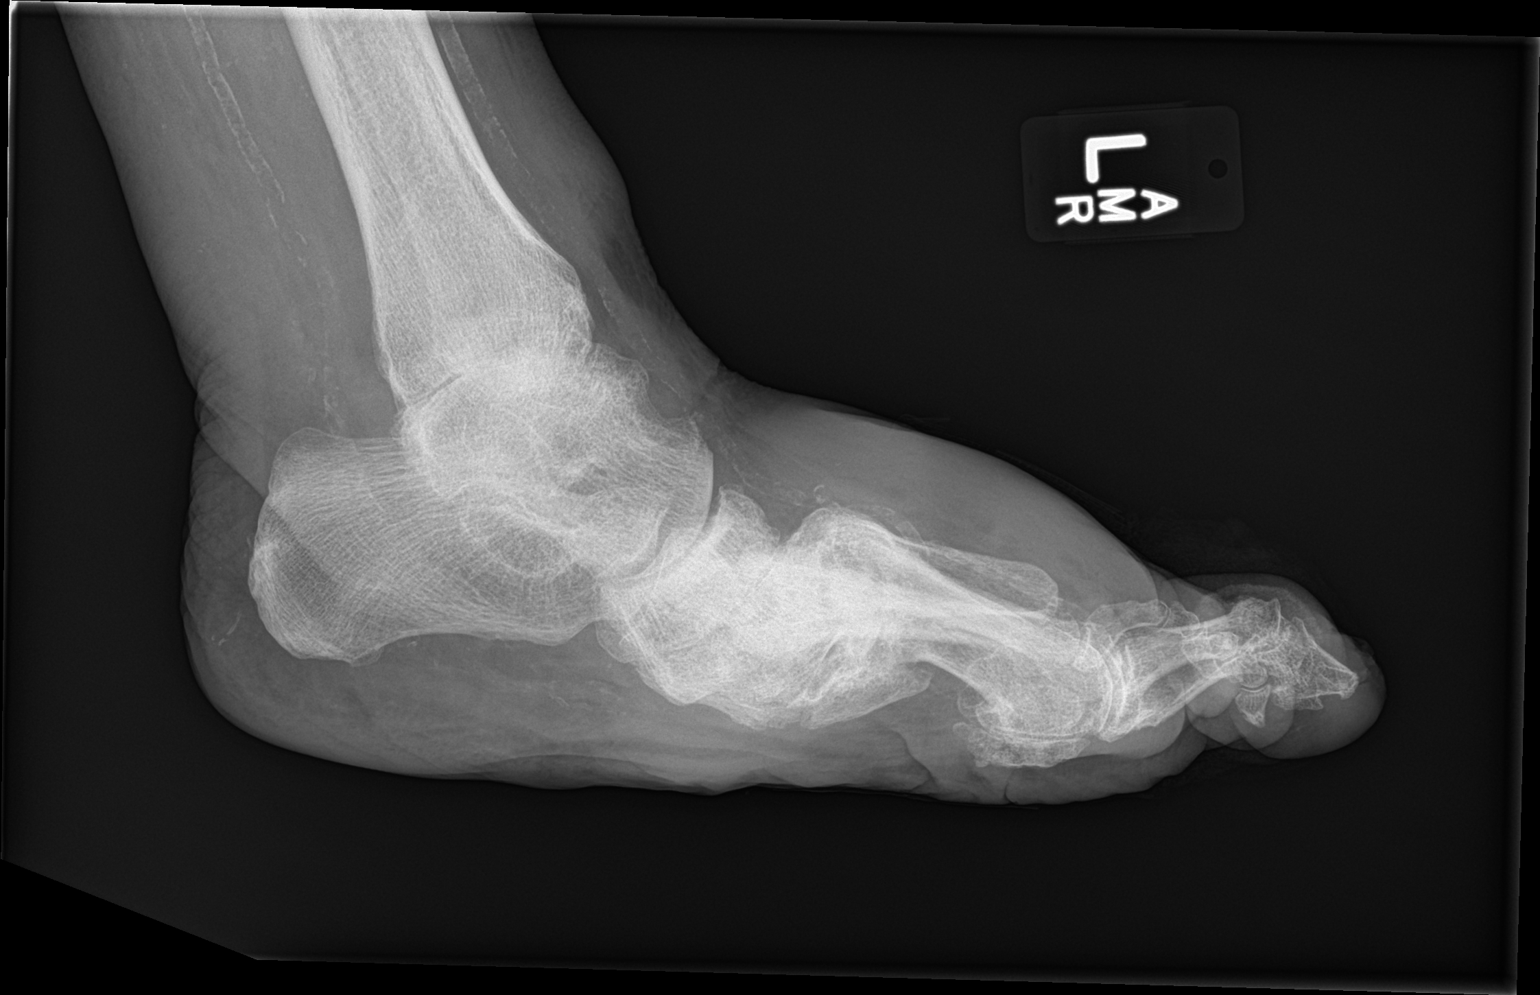

[3 of 3 positions shown; findings below may reference images not displayed]

FINDINGS: Diffuse severe soft tissue swelling is present. Prior left second
and fifth digit amputations are noted. Diffuse severe degenerative
change. No acute or focal bony abnormality identified. No evidence
of fracture. If osteomyelitis is a clinical concern MRI can be
obtained. Peripheral vascular calcification.
IMPRESSION: 1. Severe soft tissue swelling. Prior left second and fifth digit
amputations. Diffuse degenerative change. No acute or focal bony
abnormality identified.

2.  Peripheral vascular disease.

## 2019-11-03 NOTE — Progress Notes (Signed)
RAYNALDO, FALCO ([id number]) Visit Report for 07/28/2019 Debridement Details Patient Name: Date of Service: Sayer, Masini 07/28/2019 1:30 PM Medical Record [id number] Patient Account Number: [id number] Date of Birth/Sex: Treating RN: [birth year] (73 y.o. [name]) Yevonne Pax Primary Care Provider: Nita Sells Other Clinician: Referring Provider: Treating Provider/Extender:Nyaire Denbleyker, [name], [name] in Treatment: 31 Debridement Performed for Wound #2 Left,Medial Foot Assessment: Performed By: Physician Maxwell Caul., MD Debridement Type: Debridement Severity of Tissue Pre Fat layer exposed Debridement: Level of Consciousness (Pre- Awake and Alert procedure): Pre-procedure Verification/Time Out Taken: Yes - 14:54 Start Time: 14:54 Pain Control: Lidocaine 5% topical ointment Total Area Debrided (L x W): 0.5 (cm) x 0.2 (cm) = 0.1 (cm) Tissue and other material Callus, Skin: Dermis debrided: Level: Skin/Dermis Debridement Description: Selective/Open Wound Instrument: Curette Bleeding: Minimum Hemostasis Achieved: Pressure End Time: 14:55 Procedural Pain: 0 Post Procedural Pain: 0 Response to Treatment: Procedure was tolerated well Level of Consciousness Awake and Alert (Post-procedure): Post Debridement Measurements of Total Wound Length: (cm) 0.5 Width: (cm) 0.2 Depth: (cm) 0.6 Volume: (cm) 0.047 Character of Wound/Ulcer Post Improved Debridement: Severity of Tissue Post Debridement: Fat layer exposed Post Procedure Diagnosis Same as Pre-procedure Electronic Signature(s) Signed: 07/28/2019 6:24:47 PM By: Baltazar Najjar MD Signed: 11/03/2019 3:04:22 PM By: Yevonne Pax RN Entered By: Baltazar Najjar on 07/28/2019 15:12:20 -------------------------------------------------------------------------------- HPI Details Patient Name: Date of Service: Abdoul, Encinas 07/28/2019 1:30 PM Medical Record [id number] Patient Account Number: [id number] Date of  Birth/Sex: Treating RN: 12/04/1945 (73 y.o. [name] Primary Care Provider: Nita Sells Other Clinician: Referring Provider: Treating Provider/Extender:Kameren Pargas, [name], [name] in Treatment: 31 History of Present Illness HPI Description: 12/10/16; this is a 73 year old man who is a type I diabetic with an insulin pump. He is a retired Scientist, clinical (histocompatibility and immunogenetics). 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 He also has a history of diabetic angiopathy although this was previously worked up in [place]. He was told he had small vessel disease they could not be further revascularized. 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 He tells me he was cared for for a period of time in [place] had a wound care center and had hyperbaric oxygen. The patient also has rheumatoid arthritis and is on methotrexate. The patient states he has been dealing with a wound on the right medial ankle since October. He has been applying Silvadene based dressings to this. He was seen in the ER on 10/31/16 at that point felt to have cellulitis and was given prescriptions for Augmentin and Bactrim. He was apparently given a podiatry follow-up. 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 There was diffuse soft tissue swelling about the ankle. He also had a duplex ultrasound of the right leg that was negative for DVT ABIs in this clinic were noncompressible 12/28/16; most of the information the patient brought back from [place] was from 2013 at that point he had skin ulcers and suspicion of osteomyelitis. MRI of the right foot showed a Charcot foot he had osteomyelitis in the fourth and fifth metatarsals. He has had removal of the fifth  metatarsal on the left foot on 6/24 2011 amputation of the right fourth and one half of the great toe amputation of the left second toe left fifth  toe. He saw vascular surgery in 2011. At that point the had non-invasive studies showing tibial artery disease Doppler waveforms were within normal limits. There was no evidence of arterial insufficiency of the left leg the right great toe pressure was unable to be obtained secondary to an amputation of the left great toe pressure was above the healing index. It was not felt that he would be a candidate for intervention at that point he was felt to have microvascular blood supply issues. It is very clear he will need follow-up vascular studies and probably venous reflux studies is aware of the right leg. We also have information from his podiatric surgeons in [facility] x-ray of the right foot showed severe destruction of the ankle joint secondary to Charcot form deformity. There are Charcot changes visible to the ankle and midfoot joints. He was referred here. He has been using Santyl in the interim while he was traveling in [place] 01/04/17; not much change here. We have been using Santyl. He has arterial and venous studies next week asks that we can see him in 2 weeks which is reasonable. He had an ultrasound of his right leg rule out DVT in early December and they comment on no venous reflux however this was not a true reflux study. He clearly has venous insufficiency 01/18/17; the patient has undergone his ordered studies. His VENOUS REFLUX studies show no evidence of a DVT or superficial vein thrombophlebitis. There is evidence of great saphenous vein reflux in the right lower extremity as well as reflux in the common femoral and popliteal vein. No reflux was noted in the small saphenous vein on the right. ARTERIAL studies showed an ABI on the right of 1.54, waveforms were biphasic at the posterior tibial and dorsalis pedis on the  right. He could not have a TBi on the right secondary to prior amputation. There was a suggestion of falsely elevated left TBI due to calcified vessel with a left TBI of 0.95. The patient will definitely need to see vascular surgery with regards to his arterial and venous systems. 02/01/17; appointment with vascular surgery on April 4. Still using Santyl. 02/15/17; the patient was changed to Prisma 2 weeks ago at the time the wound was small but with some depth but a healthy base. Apparently everything was going well to 2 or 3 days ago when the patient and his wife noted deterioration wound. He came in today on his usual appointment. He has vascular surgery appointment on April 4. Considerable deterioration today necrotic material,. Drainage and exposed bone. Mild degree of erythema around the wound which was marked. Change the primary dressing to silver alginate. Augmentin and doxycycline until culture comes back. This was done dark drainage. MRI of the foot is been ordered. He tells me that he has previously had osteomyelitis but I don't believe in this area 02/21/17; I saw this patient a week ago with a marked deterioration in the wound over his right medial malleolus. Culture at the time showed methicillin sensitive staph aureus. I've emperically put him on doxycycline on Augmentin last week. In the meantime he noted increasing swelling and drainage and was admitted to hospital from 3/25 through 3/27. He was given vancomycin however he was discharged on doxycycline for 10 days. He is still using silver alginate to the wound bed. He comes back today with a necrotic wound surface easily palpable friable necrotic bone underneath this. He has not been systemically unwell. He tells me he is trying his  best offload this area and walking minimally. He has not been systemically unwell. 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 There is chronic  partial destruction of the distal tibia and fibula, calcaneus and navicular. The navicular is posteriorly displaced. The talus is not definitely seen either destroyed her previously resected. With regards the ulcer this was noted. The wound abuts on the medial malleolus there is thick periosteal thickening but no gross cortical destruction there is a complex fluid collection at this aortic relation between the distal tibia and the calcaneus the feeling was that this was not osteomyelitis given the extensive Charcot changes but osteomyelitis could not definitely be excluded 03/01/17- patient is here for follow-up evaluation of his right medial foot/ankle ulceration. Did have tissue culture and bone pathology sent last week. Bone pathology reveals chronic osteomyelitis, no malignancy. The tissue culture resulted oxacillin sensitive staph aureus. He is currently on Keflex. 03/04/17; the patient's pathology showed chronic osteomyelitis. Bone culture revealed methicillin sensitive staph aureus he is on Keflex. He arrives today in clinic with a piece of bone protruding out of the wound 03/11/17; with considerable difficulty and help of our staff 5 min able to get the patient appointment on Thursday with infectious disease and subtotally next Monday morning with Dr. Bennett Scrape podiatric ankle surgeon with [facility] with regards to the Charcot right ankle joint and underlying infection. He has been using silver alginate and remains on Keflex 03/18/17; the patient saw Dr. Bennett Scrape who outlined to surgical options 1 a below-knee amputation and the other a debridement of the underlying fibula possibly part of the tibia with an external fixator. Pending the patient is leaning towards the amputation as even if this could possibly result in a functional limb he would be at risk for further Charcot complications in the future. We are using silver alginate to the wound and continuing Keflex. If he opts for an aggressive posture  to attempt to save his leg then he'll probably need IV antibiotics and could be considered for hyperbaric oxygen however I doubt that's what he'll decide given his tone today READMISSION 12/22/2018 This is a patient with type 1 diabetes, severe peripheral neuropathy and severe Charcot foot and ankle deformities. We had him here in 2018 actually looking an area on the right foot. This did not heal in fact became worse and secondarily infected. He saw Dr. Bennett Scrape of podiatric surgery at [facility]. 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 He has a prosthesis and was doing well. He tells me that late in October he developed a small blister on the medial aspect of his left first metatarsal head. He was followed by podiatry initially with Iodosorb and then 2 weeks ago with Santyl to the wound area. His wife changes the dressings. 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 Since he was last here he had a left third toe amputation which healed well. ABIs in this clinic on the left were noncompressible 2/10; the patient's area on the medial aspect of the left great toe. Since the last time the patient was here 2 weeks ago this is actually deteriorated now with  exposed tendon. He has been using Santyl. The patient had arterial studies done in February 2018. On the left this showed noncompressible vessels bilaterally but with a TBI of 0.95. Waveforms were biphasic at the posterior tibial and dorsalis pedis. 2/17; x-ray from last week showed the third toe amputation. Medial soft tissue ulceration without evidence of osteomyelitis. We have been  using Santyl. His arterial studies to compare with the ones done 2 years ago are tomorrow. Run Grafix PL through his insurance 2/25. Arterial studies were done on 2/18. On the left this showed noncompressible vessels but with triphasic wave waveforms at the PTA and biphasic at the dorsalis pedis. TBI was 0.55 down from 0.95 2 years ago. His wound is on the medial left first metatarsal head. This does not have a viable surface dry and exposed tendon. 3/10/; patient did not have his vascular consult done because of an error in sending the orders. This will be done today. He arrives today with some erythema around the wound nothing but exposed tendon. I have been keeping this moist with hydrogel and silver collagen. He will need an MRI a vascular surgery consult. 3/24; he still not has not seen vascular surgery however he was admitted to hospital from 3/12 through 02/07/2019. He was felt to have acute osteomyelitis of the left foot. He was seen by his podiatrist at Dr. Allena Katz. There were no cultures done. He was initially treated with vancomycin cefepime and Flagyl however discharged on vancomycin and Augmentin. He has MRI documented septic arthritis and osteomyelitis of the first metatarsal head and neck. The patient arrived in clinic with a sheet of concerns predominantly that advanced home care did not have orders beyond Thursday morning. I E as antibiotics would stop. 3/31; he is now on vancomycin and ertapenem as directed by infectious disease. We have everybody on board. He is going for his angiogram tomorrow although I have not yet reviewed vein and vascular's notes. We have been using Santyl on the wound bed 4/7; he apparently had some renal insufficiency issues and his vancomycin was temporarily on hold. He did have his angiogram done on 4/1; this did not show any significant stenosis. There was three-vessel runoff. The dominant vessel was the anterior tibial. There was some evidence of  digital arterial atherosclerotic disease but certainly nothing that required an intervention from a macrovascular point of view. In preparation for possible HBO I am going to order a hemoglobin A1c. Also notable that he has a low albumin level. He has had a reduction in his appetite which he is attributing to antibiotic therapy. 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 I will need to research what we know about this on [place] link i.e. has he ever had urine studies 4/14; his vancomycin is still on hold as far as the patient is aware. I find this somewhat unusual unless there is more to this than I realized. We have been using Santyl to the wound 4/21; patient seen in conjunction with HBO. I did get some lab work from home health. On 4/31 his vancomycin level was in the 90s. Essentially it is been on hold since then. Lab work that was sent to me today dated 4/20 shows a vancomycin level of 17.8. Fortunately his BUN and creatinine are both normal at 20 and 1.0. His white count is 5.6 hemoglobin 11.5. I also have from 4/17 a sedimentation rate of 96 and a C-reactive protein of 20 although the patient has rheumatoid arthritis.  He also tells me that his methotrexate and biologic agent have been put on hold by his rheumatologist because of the underlying osteomyelitis. Finally I am not exactly sure who is following his antibiotics after discussion with the patient today. I did involve Dr. Orvan Falconer via secure text I think when it became clear that his antibiotics are running out after his hospitalization. I also discussed things with his primary doctor Dr. Dwana Melena [place]. But I am not exactly sure who is following the labs. He has been tolerating HBO well. 4/28; the patient is tolerating HBO well. There is been improvements in his wound dimension especially the undermining at 1:00 towards the medial foot. Today measured at 1.2 cm. We have been in contact with  home health. The antibiotics finished on Sunday. He still has his PICC line in place. C-reactive protein and sedimentation rate were measured yesterday. His methotrexate and biologic agent are still on hold he has not had any trouble with increasing pain from his RA 5/5; the patient is tolerating HBO well. He is seen today in conjunction with that his wound is improved had been improving although it is not much different from last week. Undermining towards the medial foot today measured at 1.8 up from 1.2 last week. Went to see Dr. Luciana Axe of ophthalmology. Fortunately did not have anything major wrong. Most of the symptoms he was having in his eyes were related to pseudophakia. He feels better today and he tolerated his treatment well. We are using endoform. Previously his insurance which is through [place] [employer] had refused them for Grafix. I am going to run Dermagraft I also looked over his inflammatory markers his sed rate is still over 100. I do not know how to interpret this in this patient who has rheumatoid arthritis. I am not sure that the persistence of an elevated sed rate in this man means of treatment failure 5/12; the patient was seen today for wound care evaluation in conjunction with hyperbarics that he is tolerating well. We have been using endoform. The undermining area to along the medial aspect of his foot and come down nicely however on our evaluation today that is gone back up to 4 cm which is really very disappointing. Other than that the wound bed paradoxically looks somewhat better 5/19- Patient seen after HBO, the ulcer now undermines to 3.5 cm which is better than 4.0 last time 5/26; patient was seen after HBO. He apparently had a single Dermagraft applied last week. I applied Dermagraft No. 2. Careful attention to the tunneling along the medial foot which currently measures 3.5 cm about the same as last time 6/2; patient was seen after HBO. Dermagraft No. 3  applied. Tunneling area improved condition of the wound bed and improved. 6/9; patient was seen after HBO. The tunneling area is about 1 cm about the same as last time. Dermagraft No. 4 applied. We are also going to recertify him for hyperbarics which is really seems to have helped the wound healing process. 6/16; patient was seen after HBO. The tunneling area has deepened this week towards the medial part of his foot but that had come in last week. Nevertheless the wound bed looks healthy. Dermagraft No. 5 applied after debridement 6/23. Patient was seen after HBO tunneling area is down from 4 to 3.6 cm. I think we have had some contraction of the surface area. There is no palpable bone. No evidence of infection. Dermagraft No. 6 applied 6/30; the tunneling expanded once  again to 7 cm. Yet the wound itself looks actually quite good. Dermagraft No. 7 7/7; tunneling at 4.5. Wound dimensions are down from last week especially in width. Dermagraft No. 8 7/14; the tunneling is down in the wound has less with. What I can see the base of the wound looks healthy. We will change him to endoform AG. He continues along with HBO we are making progress albeit slow 7/21; tunneling in the most proximal part of this wound is at 1 cm. The rest of this is down to a linear slitlike area. Not much with. No evidence of infection 7/28; the tunneling and the most proximal part of this wound is down to 0.3 cm measured by myself otherwise it remains a linear slitlike area. The base of this appears to be healthy. No evidence of infection. He is seen today along with hyperbarics 8/11-Left medial foot wound has covering of eschar-like tissue but it is otherwise small opening. 8/18; left medial foot using endoform. Small opening what I can see of the base of this looks healthy. Most impressively the tunneling area towards the medial part of his foot is close down there is some undermining from roughly 1:00 to 4:00 of  roughly 2 mm. 8/25; continued improvement using endoform. There is no tunneling or undermining that I could identify. 9/1; small slitlike wound on the left medial part of his foot. Very difficult to fully examine this what I can see of the wound bed looks healthy. Using endoform Electronic Signature(s) Signed: 07/28/2019 6:24:47 PM By: Baltazar Najjar MD Entered By: Baltazar Najjar on 07/28/2019 15:10:29 -------------------------------------------------------------------------------- Physical Exam Details Patient Name: Date of Service: Grayden, Burley 07/28/2019 1:30 PM Medical Record [id number] Patient Account Number: [id number] Date of Birth/Sex: Treating RN: 1946/10/24 (73 y.o. [name] Primary Care Provider: Nita Sells Other Clinician: Referring Provider: Treating Provider/Extender:Harnoor Reta, [name], [name] in Treatment: 31 Notes Wound exam; small slitlike area on the medial foot. Using a #3 curette I removed some flaking skin and callus from around the wound margin. The wound is far too narrow at this point the really check for undermining. Electronic Signature(s) Signed: 07/28/2019 6:24:47 PM By: Baltazar Najjar MD Entered By: Baltazar Najjar on 07/28/2019 15:11:13 -------------------------------------------------------------------------------- Physician Orders Details Patient Name: Date of Service: Demario, Faniel 07/28/2019 1:30 PM Medical Record [id number] Patient Account Number: [id number] Date of Birth/Sex: Treating RN: February 10, 1946 (73 y.o. [name]) Yevonne Pax Primary Care Provider: Nita Sells Other Clinician: Referring Provider: Treating Provider/Extender:Noha Milberger, [name], [name] in Treatment: 66 Verbal / Phone Orders: No Diagnosis Coding ICD-10 Coding Code Description [id number] Subacute osteomyelitis, left ankle and foot L97.524 Non-pressure chronic ulcer of other part of left foot with necrosis of bone E10.621 Type 1 diabetes mellitus with foot  ulcer E10.40 Type 1 diabetes mellitus with diabetic neuropathy, unspecified M14.672 Charcot's joint, left ankle and foot Z89.511 Acquired absence of right leg below knee Follow-up Appointments Return Appointment in 2 weeks. Dressing Change Frequency Change Dressing every other day. Skin Barriers/Peri-Wound Care Barrier cream Wound Cleansing Wound #2 Left,Medial Foot May shower and wash wound with soap and water. Primary Wound Dressing Other: - endoform ag Secondary Dressing Wound #2 Left,Medial Foot Kerlix/Rolled Gauze ABD pad Other: - secure with tape Electronic Signature(s) Signed: 07/28/2019 6:24:47 PM By: Baltazar Najjar MD Signed: 11/03/2019 3:04:22 PM By: Yevonne Pax RN Entered By: Yevonne Pax on 07/28/2019 14:53:46 -------------------------------------------------------------------------------- Problem List Details Patient Name: Date of Service: Mycah, Formica 07/28/2019 1:30 PM Medical Record [id number] Patient Account Number: [id number] Date  of Birth/Sex: Treating RN: April 29, 1946 (73 y.o. Jerilynn Mages) Carlene Coria Primary Care Provider: Allyn Kenner Other Clinician: Referring Provider: Treating Provider/Extender:Kyle Luppino, Ludger Nutting, Moses Manners in Treatment: 31 Active Problems ICD-10 Evaluated Encounter Code Description Active Date Today Diagnosis [phone] Subacute osteomyelitis, left ankle and foot 02/17/2019 No Yes L97.524 Non-pressure chronic ulcer of other part of left foot 02/17/2019 No Yes with necrosis of bone E10.621 Type 1 diabetes mellitus with foot ulcer 12/22/2018 No Yes E10.40 Type 1 diabetes mellitus with diabetic neuropathy, 12/22/2018 No Yes unspecified M14.672 Charcot's joint, left ankle and foot 12/22/2018 No Yes Z89.511 Acquired absence of right leg below knee 12/22/2018 No Yes Inactive Problems Resolved Problems Electronic Signature(s) Signed: 07/28/2019 6:24:47 PM By: Linton Ham MD Entered By: Linton Ham on 07/28/2019  15:09:20 -------------------------------------------------------------------------------- Progress Note Details Patient Name: Date of Service: Dontee, Jaso 07/28/2019 1:30 PM Medical Record [id number] Patient Account Number: [id number] Date of Birth/Sex: Treating RN: [birth year] (73 y.o. Oval Linsey Primary Care Provider: Allyn Kenner Other Clinician: Referring Provider: Treating Provider/Extender:Nicosha Struve, Ludger Nutting, Moses Manners in Treatment: 31 Subjective History of Present Illness (HPI) 12/10/16; this is a 73 year old man who is a type I diabetic with an insulin pump. He is a retired Armed forces training and education officer. 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 He was told he had small vessel disease they could not be further revascularized. 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 He tells me he was cared for for a period of time in [place] had a wound care center and had hyperbaric oxygen. The patient also has rheumatoid arthritis and is on methotrexate. The patient states he has been dealing with a wound on the right medial ankle since October. He has been applying Silvadene based dressings to this. He was seen in the ER on 10/31/16 at that point felt to have cellulitis and was given prescriptions for Augmentin and Bactrim. He was apparently given a podiatry follow-up. 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 There was diffuse soft tissue swelling about the ankle. He also had a duplex ultrasound of the right leg that was negative for DVT ABIs in this clinic were noncompressible 12/28/16; most of the information  the patient brought back from [place] was from 2013 at that point he had skin ulcers and suspicion of osteomyelitis. MRI of the right foot showed a Charcot foot he had osteomyelitis in the fourth and fifth metatarsals. He has had removal of the fifth metatarsal on the left foot on 6/24 2011 amputation of the right fourth and one half of the great toe amputation of the left second toe left fifth toe. He saw vascular surgery in 2011. At that point the had non-invasive studies showing tibial artery disease Doppler waveforms were within normal limits. There was no evidence of arterial insufficiency of the left leg the right great toe pressure was unable to be obtained secondary to an amputation of the left great toe pressure was above the healing index. It was not felt that he would be a candidate for intervention at that point he was felt to have microvascular blood supply issues. It is very clear he will need follow-up vascular studies and probably  venous reflux studies is aware of the right leg. We also have information from his podiatric surgeons in [facility] x-ray of the right foot showed severe destruction of the ankle joint secondary to Charcot form deformity. There are Charcot changes visible to the ankle and midfoot joints. He was referred here. He has been using Santyl in the interim while he was traveling in [place] 01/04/17; not much change here. We have been using Santyl. He has arterial and venous studies next week asks that we can see him in 2 weeks which is reasonable. He had an ultrasound of his right leg rule out DVT in early December and they comment on no venous reflux however this was not a true reflux study. He clearly has venous insufficiency 01/18/17; the patient has undergone his ordered studies. His VENOUS REFLUX studies show no evidence of a DVT or superficial vein thrombophlebitis. There is evidence of great saphenous vein reflux in the right lower extremity  as well as reflux in the common femoral and popliteal vein. No reflux was noted in the small saphenous vein on the right. ARTERIAL studies showed an ABI on the right of 1.54, waveforms were biphasic at the posterior tibial and dorsalis pedis on the right. He could not have a TBi on the right secondary to prior amputation. There was a suggestion of falsely elevated left TBI due to calcified vessel with a left TBI of 0.95. The patient will definitely need to see vascular surgery with regards to his arterial and venous systems. 02/01/17; appointment with vascular surgery on April 4. Still using Santyl. 02/15/17; the patient was changed to Prisma 2 weeks ago at the time the wound was small but with some depth but a healthy base. Apparently everything was going well to 2 or 3 days ago when the patient and his wife noted deterioration wound. He came in today on his usual appointment. He has vascular surgery appointment on April 4. Considerable deterioration today necrotic material,. Drainage and exposed bone. Mild degree of erythema around the wound which was marked. Change the primary dressing to silver alginate. Augmentin and doxycycline until culture comes back. This was done dark drainage. MRI of the foot is been ordered. He tells me that he has previously had osteomyelitis but I don't believe in this area 02/21/17; I saw this patient a week ago with a marked deterioration in the wound over his right medial malleolus. Culture at the time showed methicillin sensitive staph aureus. I've emperically put him on doxycycline on Augmentin last week. In the meantime he noted increasing swelling and drainage and was admitted to hospital from 3/25 through 3/27. He was given vancomycin however he was discharged on doxycycline for 10 days. He is still using silver alginate to the wound bed. He comes back today with a necrotic wound surface easily palpable friable necrotic bone underneath this. He has not been  systemically unwell. He tells me he is trying his best offload this area and walking minimally. He has not been systemically unwell. 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 There is chronic partial destruction of the distal tibia and fibula, calcaneus and navicular. The navicular is posteriorly displaced. The talus is not definitely seen either destroyed her previously resected. With regards the ulcer this was noted. The wound abuts on the medial malleolus there is thick periosteal thickening but no gross cortical destruction there is a complex fluid collection at this aortic relation between the distal  tibia and the calcaneus the feeling was that this was not osteomyelitis given the extensive Charcot changes but osteomyelitis could not definitely be excluded 03/01/17- patient is here for follow-up evaluation of his right medial foot/ankle ulceration. Did have tissue culture and bone pathology sent last week. Bone pathology reveals chronic osteomyelitis, no malignancy. The tissue culture resulted oxacillin sensitive staph aureus. He is currently on Keflex. 03/04/17; the patient's pathology showed chronic osteomyelitis. Bone culture revealed methicillin sensitive staph aureus he is on Keflex. He arrives today in clinic with a piece of bone protruding out of the wound 03/11/17; with considerable difficulty and help of our staff 5 min able to get the patient appointment on Thursday with infectious disease and subtotally next Monday morning with Dr. Bennett Scrape podiatric ankle surgeon with [facility] with regards to the Charcot right ankle joint and underlying infection. He has been using silver alginate and remains on Keflex 03/18/17; the patient saw Dr. Bennett Scrape who outlined to surgical options 1 a below-knee amputation and the other a debridement of the underlying fibula possibly part of the tibia with an external fixator. Pending the patient is  leaning towards the amputation as even if this could possibly result in a functional limb he would be at risk for further Charcot complications in the future. We are using silver alginate to the wound and continuing Keflex. If he opts for an aggressive posture to attempt to save his leg then he'll probably need IV antibiotics and could be considered for hyperbaric oxygen however I doubt that's what he'll decide given his tone today READMISSION 12/22/2018 This is a patient with type 1 diabetes, severe peripheral neuropathy and severe Charcot foot and ankle deformities. We had him here in 2018 actually looking an area on the right foot. This did not heal in fact became worse and secondarily infected. He saw Dr. Bennett Scrape of podiatric surgery at [facility]. 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 He has a prosthesis and was doing well. He tells me that late in October he developed a small blister on the medial aspect of his left first metatarsal head. He was followed by podiatry initially with Iodosorb and then 2 weeks ago with Santyl to the wound area. His wife changes the dressings. 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 Since he was last here he had a left third toe amputation which healed well. ABIs in this clinic on the left were noncompressible 2/10; the patient's area on the medial aspect of the left great toe. Since the last time the patient was here 2 weeks ago this is actually deteriorated now with exposed tendon. He has been using Santyl. The patient had arterial studies done in February 2018. On the left this showed  noncompressible vessels bilaterally but with a TBI of 0.95. Waveforms were biphasic at the posterior tibial and dorsalis pedis. 2/17; x-ray from last week showed the third toe amputation. Medial soft tissue ulceration without evidence of osteomyelitis. We have been using Santyl. His arterial studies to compare with the ones done 2 years ago are tomorrow. Run Grafix PL through his insurance 2/25. Arterial studies were done on 2/18. On the left this showed  noncompressible vessels but with triphasic wave waveforms at the PTA and biphasic at the dorsalis pedis. TBI was 0.55 down from 0.95 2 years ago. His wound is on the medial left first metatarsal head. This does not have a viable surface dry and exposed tendon. 3/10/; patient did not have his vascular consult done because of an error in sending the orders. This will be done today. He arrives today with some erythema around the wound nothing but exposed tendon. I have been keeping this moist with hydrogel and silver collagen. He will need an MRI a vascular surgery consult. 3/24; he still not has not seen vascular surgery however he was admitted to hospital from 3/12 through 02/07/2019. He was felt to have acute osteomyelitis of the left foot. He was seen by his podiatrist at Dr. [name]. There were no cultures done. He was initially treated with vancomycin cefepime and Flagyl however discharged on vancomycin and Augmentin. He has MRI documented septic arthritis and osteomyelitis of the first metatarsal head and neck. The patient arrived in clinic with a sheet of concerns predominantly that [facility] home care did not have orders beyond Thursday morning. I E as antibiotics would stop. 3/31; he is now on vancomycin and ertapenem as directed by infectious disease. We have everybody on board. He is going for his angiogram tomorrow although I have not yet reviewed vein and vascular's notes. We have been using Santyl on the wound bed 4/7; he apparently had  some renal insufficiency issues and his vancomycin was temporarily on hold. He did have his angiogram done on 4/1; this did not show any significant stenosis. There was three-vessel runoff. The dominant vessel was the anterior tibial. There was some evidence of digital arterial atherosclerotic disease but certainly nothing that required an intervention from a macrovascular point of view. In preparation for possible HBO I am going to order a hemoglobin A1c. Also notable that he has a low albumin level. He has had a reduction in his appetite which he is attributing to antibiotic therapy. 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 I will need to research what we know about this on [place] link i.e. has he ever had urine studies 4/14; his vancomycin is still on hold as far as the patient is aware. I find this somewhat unusual unless there is more to this than I realized. We have been using Santyl to the wound 4/21; patient seen in conjunction with HBO. I did get some lab work from home health. On 4/31 his vancomycin level was in the 90s. Essentially it is been on hold since then. Lab work that was sent to me today dated 4/20 shows a vancomycin level of 17.8. Fortunately his BUN and creatinine are both normal at 20 and 1.0. His white count is 5.6 hemoglobin 11.5. I also have from 4/17 a sedimentation rate of 96 and a C-reactive protein of 20 although the patient has rheumatoid arthritis. He also tells me that his methotrexate and biologic agent have been put on hold by his rheumatologist because of the underlying osteomyelitis. Finally I am not exactly sure who is following his antibiotics after discussion with the patient today. I did involve Dr. Orvan Falconerampbell via secure text I think when it became clear that his antibiotics are running out after his hospitalization. I also discussed things with his primary doctor Dr. Dwana MelenaZack Hall in [place]. But I am not exactly sure who is  following the labs. He has been tolerating HBO well. 4/28; the  patient is tolerating HBO well. There is been improvements in his wound dimension especially the undermining at 1:00 towards the medial foot. Today measured at 1.2 cm. We have been in contact with home health. The antibiotics finished on Sunday. He still has his PICC line in place. C-reactive protein and sedimentation rate were measured yesterday. His methotrexate and biologic agent are still on hold he has not had any trouble with increasing pain from his RA 5/5; the patient is tolerating HBO well. He is seen today in conjunction with that his wound is improved had been improving although it is not much different from last week. Undermining towards the medial foot today measured at 1.8 up from 1.2 last week. Went to see Dr. Luciana Axe of ophthalmology. Fortunately did not have anything major wrong. Most of the symptoms he was having in his eyes were related to pseudophakia. He feels better today and he tolerated his treatment well. We are using endoform. Previously his insurance which is through [place] [employer] had refused them for Grafix. I am going to run Dermagraft I also looked over his inflammatory markers his sed rate is still over 100. I do not know how to interpret this in this patient who has rheumatoid arthritis. I am not sure that the persistence of an elevated sed rate in this man means of treatment failure 5/12; the patient was seen today for wound care evaluation in conjunction with hyperbarics that he is tolerating well. We have been using endoform. The undermining area to along the medial aspect of his foot and come down nicely however on our evaluation today that is gone back up to 4 cm which is really very disappointing. Other than that the wound bed paradoxically looks somewhat better 5/19- Patient seen after HBO, the ulcer now undermines to 3.5 cm which is better than 4.0 last time 5/26; patient was seen after  HBO. He apparently had a single Dermagraft applied last week. I applied Dermagraft No. 2. Careful attention to the tunneling along the medial foot which currently measures 3.5 cm about the same as last time 6/2; patient was seen after HBO. Dermagraft No. 3 applied. Tunneling area improved condition of the wound bed and improved. 6/9; patient was seen after HBO. The tunneling area is about 1 cm about the same as last time. Dermagraft No. 4 applied. We are also going to recertify him for hyperbarics which is really seems to have helped the wound healing process. 6/16; patient was seen after HBO. The tunneling area has deepened this week towards the medial part of his foot but that had come in last week. Nevertheless the wound bed looks healthy. Dermagraft No. 5 applied after debridement 6/23. Patient was seen after HBO tunneling area is down from 4 to 3.6 cm. I think we have had some contraction of the surface area. There is no palpable bone. No evidence of infection. Dermagraft No. 6 applied 6/30; the tunneling expanded once again to 7 cm. Yet the wound itself looks actually quite good. Dermagraft No. 7 7/7; tunneling at 4.5. Wound dimensions are down from last week especially in width. Dermagraft No. 8 7/14; the tunneling is down in the wound has less with. What I can see the base of the wound looks healthy. We will change him to endoform AG. He continues along with HBO we are making progress albeit slow 7/21; tunneling in the most proximal part of this wound is at 1 cm. The rest of this is down to a linear slitlike  area. Not much with. No evidence of infection 7/28; the tunneling and the most proximal part of this wound is down to 0.3 cm measured by myself otherwise it remains a linear slitlike area. The base of this appears to be healthy. No evidence of infection. He is seen today along with hyperbarics 8/11-Left medial foot wound has covering of eschar-like tissue but it is otherwise small  opening. 8/18; left medial foot using endoform. Small opening what I can see of the base of this looks healthy. Most impressively the tunneling area towards the medial part of his foot is close down there is some undermining from roughly 1:00 to 4:00 of roughly 2 mm. 8/25; continued improvement using endoform. There is no tunneling or undermining that I could identify. 9/1; small slitlike wound on the left medial part of his foot. Very difficult to fully examine this what I can see of the wound bed looks healthy. Using endoform Objective Constitutional Vitals Time Taken: 2:02 PM, Height: 67 in, Weight: 208 lbs, BMI: 32.6, Temperature: 97.9 F, Pulse: 76 bpm, Respiratory Rate: 16 breaths/min, Blood Pressure: 113/59 mmHg, Capillary Blood Glucose: 140 mg/dl. General Notes: glucose per pt report Integumentary (Hair, Skin) Wound #2 status is Open. Original cause of wound was Gradually Appeared. The wound is located on the Left,Medial Foot. The wound measures 0.5cm length x 0.2cm width x 0.6cm depth; 0.079cm^2 area and 0.047cm^3 volume. There is Fat Layer (Subcutaneous Tissue) Exposed exposed. There is no tunneling noted, however, there is undermining starting at 11:00 and ending at 12:00 with a maximum distance of 0.5cm. There is a small amount of serosanguineous drainage noted. The wound margin is flat and intact. There is large (67-100%) red granulation within the wound bed. There is no necrotic tissue within the wound bed. Assessment Active Problems ICD-10 Subacute osteomyelitis, left ankle and foot Non-pressure chronic ulcer of other part of left foot with necrosis of bone Type 1 diabetes mellitus with foot ulcer Type 1 diabetes mellitus with diabetic neuropathy, unspecified Charcot's joint, left ankle and foot Acquired absence of right leg below knee Procedures Wound #2 Pre-procedure diagnosis of Wound #2 is a Diabetic Wound/Ulcer of the Lower Extremity located on the Left,Medial Foot  .Severity of Tissue Pre Debridement is: Fat layer exposed. There was a Selective/Open Wound Skin/Dermis Debridement with a total area of 0.1 sq cm performed by Maxwell Caulobson, Jaycelyn Orrison G., MD. With the following instrument(s): Curette Material removed includes Callus and Skin: Dermis and after achieving pain control using Lidocaine 5% topical ointment. No specimens were taken. A time out was conducted at 14:54, prior to the start of the procedure. A Minimum amount of bleeding was controlled with Pressure. The procedure was tolerated well with a pain level of 0 throughout and a pain level of 0 following the procedure. Post Debridement Measurements: 0.5cm length x 0.2cm width x 0.6cm depth; 0.047cm^3 volume. Character of Wound/Ulcer Post Debridement is improved. Severity of Tissue Post Debridement is: Fat layer exposed. Post procedure Diagnosis Wound #2: Same as Pre-Procedure Plan Follow-up Appointments: Return Appointment in 2 weeks. Dressing Change Frequency: Change Dressing every other day. Skin Barriers/Peri-Wound Care: Barrier cream Wound Cleansing: Wound #2 Left,Medial Foot: May shower and wash wound with soap and water. Primary Wound Dressing: Other: - endoform ag Secondary Dressing: Wound #2 Left,Medial Foot: Kerlix/Rolled Gauze ABD pad Other: - secure with tape 1. Continue endoform 2. 2-week follow-up. Hopefully we will see progression of this towards closure 3. No evidence of infection Electronic Signature(s) Signed: 07/28/2019 3:13:00 PM By:  Baltazar Najjar MD Entered By: Baltazar Najjar on 07/28/2019 15:12:59 -------------------------------------------------------------------------------- SuperBill Details Patient Name: Date of Service: Ishmeal, Rorie 07/28/2019 Medical Record [id number] Patient Account Number: [id number] Date of Birth/Sex: Treating RN: 1946-03-24 (73 y.o. [name]) Yevonne Pax Primary Care Provider: Nita Sells Other Clinician: Referring Provider: Treating  Provider/Extender:Kensy Blizard, [name], [name] in Treatment: 31 Diagnosis Coding ICD-10 Codes Code Description [phone] Subacute osteomyelitis, left ankle and foot L97.524 Non-pressure chronic ulcer of other part of left foot with necrosis of bone E10.621 Type 1 diabetes mellitus with foot ulcer E10.40 Type 1 diabetes mellitus with diabetic neuropathy, unspecified M14.672 Charcot's joint, left ankle and foot Z89.511 Acquired absence of right leg below knee Facility Procedures CPT4 Code Description: [id number] - DEBRIDE WOUND 1ST 20 SQ CM OR < ICD-10 Diagnosis Description L97.524 Non-pressure chronic ulcer of other part of left foot with n Modifier: ecrosis of Quantity: 1 bone Physician Procedures CPT4 Code Description: [id number] - WC PHYS DEBR WO ANESTH 20 SQ CM ICD-10 Diagnosis Description L97.524 Non-pressure chronic ulcer of other part of left foot with n Modifier: ecrosis of b Quantity: 1 one Electronic Signature(s) Signed: 07/28/2019 6:24:47 PM By: Baltazar Najjar MD Entered By: Baltazar Najjar on 07/28/2019 15:13:16

## 2019-11-03 NOTE — Progress Notes (Signed)
Andrew, Schultz ([id number]) Visit Report for 08/13/2019 Arrival Information Details Patient Name: Date of Service: Andrew Schultz, Andrew Schultz 08/13/2019 1:30 PM Medical Record [id number] Patient Account Number: [id number] Date of Birth/Sex: Treating RN: 1946-01-11 (73 y.o. [name]) Yevonne Pax Primary Care Daison Braxton: Nita Sells Other Clinician: Referring Karlos Scadden: Treating Shiane Wenberg/Extender:[name], [name], [name] in Treatment: 47 Visit Information History Since Last Visit All ordered tests and consults were completed: No Patient Arrived: Andrew Schultz Added or deleted any medications: No Arrival Time: 13:47 Any new allergies or adverse reactions: No Accompanied By: self Had a fall or experienced change in No Transfer Assistance: None activities of daily living that may affect Patient Identification Verified: Yes risk of falls: Secondary Verification Process Yes Signs or symptoms of abuse/neglect since last No Completed: visito Patient Requires Transmission-Based No Hospitalized since last visit: No Precautions: Implantable device outside of the clinic excluding No Patient Has Alerts: Yes cellular tissue based products placed in the center Patient Alerts: L ABI non since last visit: compressible Has Dressing in Place as Prescribed: Yes Pain Present Now: No Electronic Signature(s) Signed: 11/03/2019 3:04:22 PM By: Yevonne Pax RN Entered By: Yevonne Pax on 08/13/2019 13:48:26 -------------------------------------------------------------------------------- Clinic Level of Care Assessment Details Patient Name: Date of Service: Andrew, Schultz 08/13/2019 1:30 PM Medical Record [id number] Patient Account Number: [id number] Date of Birth/Sex: Treating RN: August 31, 1946 (73 y.o. [name] Primary Care Brenlyn Beshara: Nita Sells Other Clinician: Referring Keri Tavella: Treating Fleetwood Pierron/Extender:[name], [name], [name] in Treatment: 33 Clinic Level of Care Assessment Items TOOL 4  Quantity Score X - Use when only an EandM is performed on FOLLOW-UP visit 1 0 ASSESSMENTS - Nursing Assessment / Reassessment X - Reassessment of Co-morbidities (includes updates in patient status) 1 10 X - Reassessment of Adherence to Treatment Plan 1 5 ASSESSMENTS - Wound and Skin Assessment / Reassessment X - Simple Wound Assessment / Reassessment - one wound 1 5 []  - Complex Wound Assessment / Reassessment - multiple wounds 0 X - Dermatologic / Skin Assessment (not related to wound area) 1 10 ASSESSMENTS - Focused Assessment X - Circumferential Edema Measurements - multi extremities 1 5 X - Nutritional Assessment / Counseling / Intervention 1 10 []  - Lower Extremity Assessment (monofilament, tuning fork, pulses) 0 []  - Peripheral Arterial Disease Assessment (using hand held doppler) 0 ASSESSMENTS - Ostomy and/or Continence Assessment and Care []  - Incontinence Assessment and Management 0 []  - Ostomy Care Assessment and Management (repouching, etc.) 0 PROCESS - Coordination of Care X - Simple Patient / Family Education for ongoing care 1 15 []  - Complex (extensive) Patient / Family Education for ongoing care 0 X - Staff obtains , Records, Test Results / Process Orders 1 10 []  - Staff telephones HHA, Nursing Homes / Clarify orders / etc 0 []  - Routine Transfer to another Facility (non-emergent condition) 0 []  - Routine Hospital Admission (non-emergent condition) 0 []  - New Admissions / / Ordering NPWT, Apligraf, etc. 0 []  - Emergency Hospital Admission (emergent condition) 0 X - Simple Discharge Coordination 1 10 []  - Complex (extensive) Discharge Coordination 0 PROCESS - Special Needs []  - Pediatric / Minor Patient Management 0 []  - Isolation Patient Management 0 []  - Hearing / Language / Visual special needs 0 []  - Assessment of Community assistance (transportation, D/C planning, etc.) 0 []  - Additional assistance / Altered mentation 0 []  -  Support Surface(s) Assessment (bed, cushion, seat, etc.) 0 INTERVENTIONS - Wound Cleansing / Measurement X - Simple Wound Cleansing - one wound 1  5 []  - Complex Wound Cleansing - multiple wounds 0 X - Wound Imaging (photographs - any number of wounds) 1 5 []  - Wound Tracing (instead of photographs) 0 X - Simple Wound Measurement - one wound 1 5 []  - Complex Wound Measurement - multiple wounds 0 INTERVENTIONS - Wound Dressings []  - Small Wound Dressing one or multiple wounds 0 X - Medium Wound Dressing one or multiple wounds 1 15 []  - Large Wound Dressing one or multiple wounds 0 []  - Application of Medications - topical 0 []  - Application of Medications - injection 0 INTERVENTIONS - Miscellaneous []  - External ear exam 0 []  - Specimen Collection (cultures, biopsies, blood, body fluids, etc.) 0 []  - Specimen(s) / Culture(s) sent or taken to Lab for analysis 0 []  - Patient Transfer (multiple staff / Civil Service fast streamer / Similar devices) 0 []  - Simple Staple / Suture removal (25 or less) 0 []  - Complex Staple / Suture removal (26 or more) 0 []  - Hypo / Hyperglycemic Management (close monitor of Blood Glucose) 0 []  - Ankle / Brachial Index (ABI) - do not check if billed separately 0 X - Vital Signs 1 5 Has the patient been seen at the hospital within the last three years: Yes Total Score: 115 Level Of Care: New/Established - Level 3 Electronic Signature(s) Signed: 08/13/2019 5:51:26 PM By: Deon Pilling Entered By: Deon Pilling on 08/13/2019 17:29:59 -------------------------------------------------------------------------------- Encounter Discharge Information Details Patient Name: Date of Service: Andrew Schultz, Andrew Schultz 08/13/2019 1:30 PM Medical Record [id number] Patient Account Number: [id number] Date of Birth/Sex: Treating RN: Oct 26, 1946 (73 y.o. [name] Primary Care Wil Slape: Allyn Kenner Other Clinician: Referring Chasidy Janak: Treating Shalyn Koral/Extender:[name], Ludger Nutting,  Moses Manners in Treatment: 42 Encounter Discharge Information Items Discharge Condition: Stable Ambulatory Status: Ambulatory Discharge Destination: Home Transportation: Private Auto Accompanied By: self Schedule Follow-up Appointment: Yes Clinical Summary of Care: Patient Declined Electronic Signature(s) Signed: 11/03/2019 3:04:22 PM By: Carlene Coria RN Entered By: Carlene Coria on 08/13/2019 15:22:52 -------------------------------------------------------------------------------- Lower Extremity Assessment Details Patient Name: Date of Service: Andrew Schultz, Andrew Schultz 08/13/2019 1:30 PM Medical Record [id number] Patient Account Number: [id number] Date of Birth/Sex: Treating RN: May 01, 1946 (73 y.o. Jerilynn Mages) Carlene Coria Primary Care Georgenia Salim: Allyn Kenner Other Clinician: Referring Coolidge Gossard: Treating Sharnise Blough/Extender:[name], Ludger Nutting, Moses Manners in Treatment: 33 Edema Assessment Assessed: [Left: No] [Right: No] Edema: [Left: N] [Right: o] Calf Left: Right: Point of Measurement: 35 cm From Medial Instep 41 cm cm Ankle Left: Right: Point of Measurement: 9 cm From Medial Instep 26 cm cm Electronic Signature(s) Signed: 11/03/2019 3:04:22 PM By: Carlene Coria RN Entered By: Carlene Coria on 08/13/2019 13:54:32 -------------------------------------------------------------------------------- Multi Wound Chart Details Patient Name: Date of Service: Andrew Schultz, Andrew Schultz 08/13/2019 1:30 PM Medical Record [id number] Patient Account Number: [id number] Date of Birth/Sex: Treating RN: 08/25/1946 (73 y.o. 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] Comorbid History: [2:Sleep Apnea, Type I Diabetes, Rheumatoid Arthritis, Osteomyelitis, Neuropathy, Seizure Disorder] [N/A:N/A] Date Acquired: [2:09/26/2018] [N/A:N/A] Weeks of Treatment: [2:33] [N/A:N/A] Wound Status: [2:Open] [N/A:N/A] Measurements L x W x D 0.5x0.2x0.4 [N/A:N/A] (cm) Area (cm) : [2:0.079] [N/A:N/A] Volume (cm) : [2:0.031] [N/A:N/A] % Reduction in Area: [2:92.40%] [N/A:N/A] % Reduction in Volume:  85.00% [N/A:N/A] Classification: [2:Grade 3] [N/A:N/A] Exudate Amount: [2:Small] [N/A:N/A] Exudate Type: [2:Serosanguineous] [N/A:N/A] Exudate Color: [2:red, brown] [N/A:N/A] Wound Margin: [2:Flat and Intact] [N/A:N/A] Granulation Amount: [2:Large (67-100%)] [N/A:N/A] Granulation Quality: [2:Red] [N/A:N/A] Necrotic Amount: [2:None Present (0%)] [N/A:N/A] Exposed Structures: [2:Fat Layer (Subcutaneous N/A Tissue) Exposed: Yes Fascia: No Tendon: No Muscle: No Joint: No Bone: No] Epithelialization: [2:Small (1-33%) maceration to periwound] [N/A:N/A N/A] Treatment Notes Electronic Signature(s) Signed: 08/13/2019 5:09:12 PM By: Baltazar Najjarobson, Michael MD Signed: 08/13/2019 5:51:26 PM By: Shawn Stalleaton, Bobbi Entered By: Baltazar Najjarobson, Michael on 08/13/2019 14:23:03 -------------------------------------------------------------------------------- Multi-Disciplinary Care Plan Details Patient Name: Date of Service: Andrew Schultz, Andrew Schultz 08/13/2019 1:30 PM Medical Record [id number] Patient Account Number: [id number] Date of Birth/Sex: Treating RN: 1946/05/16 (73 y.o. [name]) Deaton, Bobbi Primary Care Lydia Meng: Nita SellsHALL, Hamlin Other Clinician: Referring Shelvy Perazzo: Treating Wendie Diskin/Extender:[name], Natasha BenceMichael HALL, Vivianne SpenceJOHN Weeks in Treatment: 2333 Active Inactive Wound/Skin Impairment Nursing Diagnoses: Impaired tissue integrity Knowledge deficit related to ulceration/compromised  skin integrity Goals: Patient/caregiver will verbalize understanding of skin care regimen Date Initiated: 12/22/2018 Target Resolution Date: 08/28/2019 Goal Status: Active Ulcer/skin breakdown will have a volume reduction of 30% by week 4 Date Initiated: 12/22/2018 Date Inactivated: 01/20/2019 Target Resolution Date: 01/23/2019 Goal Status: Unmet Unmet Reason: DM Ulcer/skin breakdown will have a volume reduction of 50% by week 8 Date Initiated: 01/20/2019 Date Inactivated: 02/24/2019 Target Resolution Date: 02/20/2019 Unmet Goal Status: Unmet Reason: comorobities Ulcer/skin breakdown will have a volume reduction of 80% by week 12 Date Initiated: 02/24/2019 Date Inactivated: 03/17/2019 Target Resolution Date: 03/27/2019 Unmet Goal Status: Unmet Reason: Osteomyelitis Ulcer/skin breakdown will heal within 14 weeks Date Initiated: 03/17/2019 Date Inactivated: 03/31/2019 Target Resolution Date: 04/03/2019 Unmet Goal Status: Unmet Reason: Osteomyelitis Interventions: Assess patient/caregiver ability to obtain necessary supplies Assess patient/caregiver ability to perform ulcer/skin care regimen upon admission and as needed Assess ulceration(s) every visit Provide education on ulcer and skin care Notes: Electronic Signature(s) Signed: 08/13/2019 5:51:26 PM By: Shawn Stalleaton, Bobbi Entered By: Shawn Stalleaton, Bobbi on 08/13/2019 14:13:07 -------------------------------------------------------------------------------- Pain Assessment Details Patient Name: Date of Service: Andrew Schultz, Andrew Schultz 08/13/2019 1:30 PM Medical Record [id number] Patient Account Number: [id number] Date of Birth/Sex: Treating RN: 1946/05/16 (73 y.o. Judie PetitM) Yevonne PaxEpps, Carrie Primary Care Alyxander Kollmann: Nita SellsHALL, Atharv Other Clinician: Referring Graclyn Lawther: Treating Tamera Pingley/Extender:[name], Natasha BenceMichael HALL, Vivianne SpenceJOHN Weeks in Treatment: 33 Active Problems Location of Pain Severity and Description of Pain Patient Has Paino No Site Locations Pain Management and  Medication Current Pain Management: Electronic Signature(s) Signed: 11/03/2019 3:04:22 PM By: Yevonne PaxEpps, Carrie RN Entered By: Yevonne PaxEpps, Carrie on 08/13/2019 13:49:01 -------------------------------------------------------------------------------- Patient/Caregiver Education Details Patient Name: Date of Service: Andrew Schultz, Andrew Schultz 9/17/2020andnbsp1:30 PM Medical Record [id number] Patient Account Number: [id number] Date of Birth/Gender: 1946/05/16 (73 y.o. M) Treating RN: Shawn Stalleaton, Bobbi Primary Care Physician: Nita SellsHALL, Lennon Other Clinician: Referring Physician: Treating Physician/Extender:[name], Natasha BenceMichael HALL, Vivianne SpenceJOHN Weeks in Treatment: 9133 Education Assessment Education Provided To: Patient Education Topics Provided Wound/Skin Impairment: Handouts: Caring for Your Ulcer Methods: Explain/Verbal Responses: Reinforcements needed Electronic Signature(s) Signed: 08/13/2019 5:51:26 PM By: Shawn Stalleaton, Bobbi Entered By: Shawn Stalleaton, Bobbi on 08/13/2019 14:13:30 -------------------------------------------------------------------------------- Wound Assessment Details Patient Name: Date of Service: Andrew Schultz, Andrew Schultz 08/13/2019 1:30 PM Medical Record [id number] Patient Account Number: [id number] Date of Birth/Sex: Treating RN: 1946/05/16 (73 y.o. Judie PetitM) Yevonne PaxEpps, Carrie Primary Care Leo Fray: Nita SellsHALL, Zavian Other Clinician: Referring Hadley Soileau: Treating Arianis Bowditch/Extender:[name], Natasha BenceMichael HALL, Vivianne SpenceJOHN Weeks in Treatment: 33 Wound Status Wound Number: 2 Primary Diabetic Wound/Ulcer of the Lower Extremity Etiology: Wound Location: Left Foot - Medial Wound Open Wounding Event: Gradually Appeared Status: Date Acquired: 09/26/2018 Comorbid Sleep Apnea, Type I Diabetes, Rheumatoid Weeks  Of Treatment: 33 History: Arthritis, Osteomyelitis, Neuropathy, Seizure Clustered Wound: No Disorder Photos Wound Measurements Length: (cm) 0.5 Width: (cm) 0.2 Depth: (cm) 0.4 Area: (cm) 0.079 Volume: (cm) 0.031 Wound  Description Classification: Grade 3 Wound Margin: Flat and Intact Exudate Amount: Small Exudate Type: Serosanguineous Exudate Color: red, brown Wound Bed Granulation Amount: Large (67-100%) Granulation Quality: Red Necrotic Amount: None Present (0%) fter Cleansing: No ino No Exposed Structure ed: No ubcutaneous Tissue) Exposed: Yes ed: No ed: No d: No : No % Reduction in Area: 92.4% % Reduction in Volume: 85% Epithelialization: Small (1-33%) Tunneling: No Undermining: No Foul Odor A Slough/Fibr Fascia Expos Fat Layer (S Tendon Expos Muscle Expos Joint Expose Bone Exposed Assessment Notes maceration to periwound Electronic Signature(s) Signed: 08/14/2019 3:55:03 PM By: Benjaman Kindler EMT/HBOT Signed: 11/03/2019 3:04:22 PM By: Yevonne Pax RN Entered By: Benjaman Kindler on 08/14/2019 11:07:11 -------------------------------------------------------------------------------- Vitals Details Patient Name: Date of Service: Andrew Schultz, Andrew Schultz 08/13/2019 1:30 PM Medical Record [id number] Patient Account Number: [id number] Date of Birth/Sex: Treating RN: 05-14-1946 (73 y.o. [name]) Jettie Pagan, Lyla Son Primary Care Thiago Ragsdale: Nita Sells Other Clinician: Referring Jailene Cupit: Treating Devi Hopman/Extender:[name], [name], [name] in Treatment: 33 Vital Signs Time Taken: 13:48 Temperature (F): 98 Height (in): 67 Pulse (bpm): 84 Weight (lbs): 208 Respiratory Rate (breaths/min): 18 Body Mass Index (BMI): 32.6 Blood Pressure (mmHg): 130/53 Reference Range: 80 - 120 mg / dl Electronic Signature(s) Signed: 11/03/2019 3:04:22 PM By: Yevonne Pax RN Entered By: Yevonne Pax on 08/13/2019 13:48:53

## 2019-11-03 NOTE — Progress Notes (Signed)
Andrew Schultz, Andrew Schultz ([id number]) Visit Report for 09/10/2019 Arrival Information Details Patient Name: Date of Service: Andrew Schultz, Andrew Schultz 09/10/2019 1:30 PM Medical Record [id number] Patient Account Number: [id number] Date of Birth/Sex: Treating RN: 1946/05/25 (73 y.o. Andrew Schultz) Yevonne Pax Primary Care Earlee Herald: Nita Sells Other Clinician: Referring Conor Lata: Treating Yuktha Kerchner/Extender:[name], [name], [name] in Treatment: 37 Visit Information History Since Last Visit All ordered tests and consults were completed: No Patient Arrived: Ambulatory Added or deleted any medications: No Arrival Time: 14:04 Any new allergies or adverse reactions: No Accompanied By: self Had a fall or experienced change in No Transfer Assistance: None activities of daily living that may affect Patient Identification Verified: Yes risk of falls: Secondary Verification Process Yes Signs or symptoms of abuse/neglect since last No Completed: visito Patient Requires Transmission-Based No Hospitalized since last visit: No Precautions: Implantable device outside of the clinic excluding No Patient Has Alerts: Yes cellular tissue based products placed in the center Patient Alerts: L ABI non since last visit: compressible Has Dressing in Place as Prescribed: Yes Pain Present Now: No Electronic Signature(s) Signed: 11/03/2019 3:01:15 PM By: Yevonne Pax RN Entered By: Yevonne Pax on 09/10/2019 14:04:44 -------------------------------------------------------------------------------- Clinic Level of Care Assessment Details Patient Name: Date of Service: Andrew Schultz, Andrew Schultz 09/10/2019 1:30 PM Medical Record [id number] Patient Account Number: [id number] Date of Birth/Sex: Treating RN: 1946/10/26 (73 y.o. Andrew Schultz Primary Care Bellamarie Pflug: Nita Sells Other Clinician: Referring Lamont Glasscock: Treating Decklyn Hornik/Extender:[name], [name], [name] in Treatment: 37 Clinic Level of Care  Assessment Items TOOL 4 Quantity Score X - Use when only an EandM is performed on FOLLOW-UP visit 1 0 ASSESSMENTS - Nursing Assessment / Reassessment X - Reassessment of Co-morbidities (includes updates in patient status) 1 10 X - Reassessment of Adherence to Treatment Plan 1 5 ASSESSMENTS - Wound and Skin Assessment / Reassessment X - Simple Wound Assessment / Reassessment - one wound 1 5 []  - Complex Wound Assessment / Reassessment - multiple wounds 0 []  - Dermatologic / Skin Assessment (not related to wound area) 0 ASSESSMENTS - Focused Assessment []  - Circumferential Edema Measurements - multi extremities 0 []  - Nutritional Assessment / Counseling / Intervention 0 X - Lower Extremity Assessment (monofilament, tuning fork, pulses) 1 5 []  - Peripheral Arterial Disease Assessment (using hand held doppler) 0 ASSESSMENTS - Ostomy and/or Continence Assessment and Care []  - Incontinence Assessment and Management 0 []  - Ostomy Care Assessment and Management (repouching, etc.) 0 PROCESS - Coordination of Care X - Simple Patient / Family Education for ongoing care 1 15 []  - Complex (extensive) Patient / Family Education for ongoing care 0 X - Staff obtains , Records, Test Results / Process Orders 1 10 []  - Staff telephones HHA, Nursing Homes / Clarify orders / etc 0 []  - Routine Transfer to another Facility (non-emergent condition) 0 []  - Routine Hospital Admission (non-emergent condition) 0 []  - New Admissions / / Ordering NPWT, Apligraf, etc. 0 []  - Emergency Hospital Admission (emergent condition) 0 X - Simple Discharge Coordination 1 10 []  - Complex (extensive) Discharge Coordination 0 PROCESS - Special Needs []  - Pediatric / Minor Patient Management 0 []  - Isolation Patient Management 0 []  - Hearing / Language / Visual special needs 0 []  - Assessment of Community assistance (transportation, D/C planning, etc.) 0 []  - Additional assistance / Altered  mentation 0 []  - Support Surface(s) Assessment (bed, cushion, seat, etc.) 0 INTERVENTIONS - Wound Cleansing / Measurement []  - Simple Wound Cleansing - one wound 0 []  -  Complex Wound Cleansing - multiple wounds 0 []  - Wound Imaging (photographs - any number of wounds) 0 []  - Wound Tracing (instead of photographs) 0 []  - Simple Wound Measurement - one wound 0 []  - Complex Wound Measurement - multiple wounds 0 INTERVENTIONS - Wound Dressings []  - Small Wound Dressing one or multiple wounds 0 []  - Medium Wound Dressing one or multiple wounds 0 []  - Large Wound Dressing one or multiple wounds 0 []  - Application of Medications - topical 0 []  - Application of Medications - injection 0 INTERVENTIONS - Miscellaneous []  - External ear exam 0 []  - Specimen Collection (cultures, biopsies, blood, body fluids, etc.) 0 []  - Specimen(s) / Culture(s) sent or taken to Lab for analysis 0 []  - Patient Transfer (multiple staff / [employer] Service fast streamer / Similar devices) 0 []  - Simple Staple / Suture removal (25 or less) 0 []  - Complex Staple / Suture removal (26 or more) 0 []  - Hypo / Hyperglycemic Management (close monitor of Blood Glucose) 0 []  - Ankle / Brachial Index (ABI) - do not check if billed separately 0 X - Vital Signs 1 5 Has the patient been seen at the hospital within the last three years: Yes Total Score: 65 Level Of Care: New/Established - Level 2 Electronic Signature(s) Signed: 09/11/2019 6:00:55 PM By: Levan Hurst RN, BSN Entered By: Levan Hurst on 09/10/2019 18:14:15 -------------------------------------------------------------------------------- Encounter Discharge Information Details Patient Name: Date of Service: Andrew Ludwig D. 09/10/2019 1:30 PM Medical Record [id number] Patient Account Number: [id number] Date of Birth/Sex: Treating RN: [birth year] (73 y.o. Andrew Schultz Primary Care Deyvi Bonanno: Allyn Kenner Other Clinician: Referring Hugh Kamara: Treating  Arraya Buck/Extender:[name], Ludger Nutting, Moses Manners in Treatment: 37 Encounter Discharge Information Items Discharge Condition: Stable Ambulatory Status: Cane Discharge Destination: Home Transportation: Private Auto Accompanied By: alone Schedule Follow-up Appointment: Yes Clinical Summary of Care: Patient Declined Electronic Signature(s) Signed: 09/11/2019 6:00:55 PM By: Levan Hurst RN, BSN Entered By: Levan Hurst on 09/10/2019 18:14:45 -------------------------------------------------------------------------------- Lower Extremity Assessment Details Patient Name: Date of Service: Andrew Ludwig D. 09/10/2019 1:30 PM Medical Record [id number] Patient Account Number: [id number] Date of Birth/Sex: Treating RN: 09-28-1946 (73 y.o. Andrew Schultz Primary Care Hazael Olveda: Allyn Kenner Other Clinician: Referring Chaselyn Nanney: Treating Tavien Chestnut/Extender:[name], Ludger Nutting, Moses Manners in Treatment: 37 Edema Assessment Assessed: [Left: No] [Right: No] Edema: [Left: N] [Right: o] Calf Left: Right: Point of Measurement: 35 cm From Medial Instep 41.4 cm cm Ankle Left: Right: Point of Measurement: 9 cm From Medial Instep 26.6 cm cm Electronic Signature(s) Signed: 09/11/2019 6:00:55 PM By: Levan Hurst RN, BSN Entered By: Levan Hurst on 09/10/2019 14:22:04 -------------------------------------------------------------------------------- Multi Wound Chart Details Patient Name: Date of Service: Andrew Ludwig D. 09/10/2019 1:30 PM Medical Record [id number] Patient Account Number: [id number] Date of Birth/Sex: Treating RN: 10-Aug-1946 (73 y.o. Andrew Schultz Primary Care Elvin Banker: Allyn Kenner Other Clinician: Referring Uchenna Rappaport: Treating Dequita Schleicher/Extender:[name], Ludger Nutting, Moses Manners in Treatment: 37 Vital Signs Height(in): 67 Capillary Blood 78 Glucose(mg/dl): Weight(lbs): 208 Pulse(bpm): 86 Body Mass Index(BMI): 33 Blood Pressure(mmHg):  100/60 Temperature(F): 98 Respiratory 18 Rate(breaths/min): Wound Assessments Treatment Notes Electronic Signature(s) Signed: 09/10/2019 5:35:18 PM By: Linton Ham MD Signed: 09/11/2019 6:00:55 PM By: Levan Hurst RN, BSN Entered By: Linton Ham on 09/10/2019 14:37:07 -------------------------------------------------------------------------------- Multi-Disciplinary Care Plan Details Patient Name: Date of Service: Andrew Schultz, Andrew Schultz 09/10/2019 1:30 PM Medical Record [id number] Patient Account Number: [id number] Date of Birth/Sex: Treating RN: Jul 26, 1946 (73 y.o. Andrew Schultz Primary Care Cathleen Yagi: Allyn Kenner Other Clinician: Referring Pheonix Wisby: Treating Raphael Espe/Extender:[name], Legrand Como  HALL, Dontarius Weeks in Treatment: 37 Active Inactive Electronic Signature(s) Signed: 09/11/2019 6:00:55 PM By: Zandra Abts RN, BSN Entered By: Zandra Abts on 09/10/2019 18:13:11 -------------------------------------------------------------------------------- Pain Assessment Details Patient Name: Date of Service: Andrew Schultz 09/10/2019 1:30 PM Medical Record [id number] Patient Account Number: [id number] Date of Birth/Sex: Treating RN: 13-Sep-1946 (73 y.o. Andrew Schultz) Yevonne Pax Primary Care Rukiya Hodgkins: Nita Sells Other Clinician: Referring Eldean Nanna: Treating Rafferty Postlewait/Extender:[name], [name], [name] in Treatment: 37 Active Problems Location of Pain Severity and Description of Pain Patient Has Paino No Site Locations Pain Management and Medication Current Pain Management: Electronic Signature(s) Signed: 11/03/2019 3:01:15 PM By: Yevonne Pax RN Entered By: Yevonne Pax on 09/10/2019 14:05:29 -------------------------------------------------------------------------------- Patient/Caregiver Education Details Patient Name: Date of Service: Andrew Schultz 10/15/2020andnbsp1:30 PM Medical Record [id number] Patient Account Number: [id number] Date of  Birth/Gender: [birth year] (73 y.o. M) Treating RN: Zandra Abts Primary Care Physician: Nita Sells Other Clinician: Referring Physician: Treating Physician/Extender:[name], [name], [name] in Treatment: 37 Education Assessment Education Provided To: Patient Education Topics Provided Wound/Skin Impairment: Methods: Explain/Verbal Responses: State content correctly Electronic Signature(s) Signed: 09/11/2019 6:00:55 PM By: Zandra Abts RN, BSN Entered By: Zandra Abts on 09/10/2019 14:23:00 -------------------------------------------------------------------------------- Vitals Details Patient Name: Date of Service: Andrew Otter D. 09/10/2019 1:30 PM Medical Record [id number] Patient Account Number: [id number] Date of Birth/Sex: Treating RN: May 29, 1946 (73 y.o. Andrew Schultz) Yevonne Pax Primary Care Ebenezer Mccaskey: Nita Sells Other Clinician: Referring Kailin Leu: Treating Nemiah Bubar/Extender:[name], [name], [name] in Treatment: 37 Vital Signs Time Taken: 14:04 Temperature (F): 98 Height (in): 67 Pulse (bpm): 86 Weight (lbs): 208 Respiratory Rate (breaths/min): 18 Body Mass Index (BMI): 32.6 Blood Pressure (mmHg): 100/60 Capillary Blood Glucose (mg/dl): 78 Reference Range: 80 - 120 mg / dl Notes CBG per patient Electronic Signature(s) Signed: 11/03/2019 3:01:15 PM By: Yevonne Pax RN Entered By: Yevonne Pax on 09/10/2019 14:05:22

## 2019-11-03 NOTE — Progress Notes (Signed)
Andrew, Schultz ([id number]) Visit Report for 07/07/2019 Debridement Details Patient Name: Date of Service: Andrew Schultz, Andrew Schultz 07/07/2019 1:00 PM Medical Record [id number] Patient Account Number: [id number] Date of Birth/Sex: Treating RN: August 05, 1946 (73 y.o. [name]) Yevonne Pax Primary Care Provider: Nita Sells Other Clinician: Referring Provider: Treating Provider/Extender:Maddisen Vought, Ty Hilts, [name] in Treatment: 28 Debridement Performed for Wound #2 Left,Medial Foot Assessment: Performed By: Physician Cassandria Anger, MD Debridement Type: Debridement Severity of Tissue Pre Fat layer exposed Debridement: Level of Consciousness (Pre- Awake and Alert procedure): Pre-procedure Verification/Time Out Taken: Yes - 13:35 Start Time: 13:35 Pain Control: Lidocaine 5% topical ointment Total Area Debrided (L x W): 0.5 (cm) x 0.3 (cm) = 0.15 (cm) Tissue and other material Viable, Non-Viable, Callus, Slough, Subcutaneous, Slough debrided: Level: Skin/Subcutaneous Tissue Debridement Description: Excisional Instrument: Curette Bleeding: Minimum Hemostasis Achieved: Pressure End Time: 13:43 Procedural Pain: 0 Post Procedural Pain: 0 Response to Treatment: Procedure was tolerated well Level of Consciousness Awake and Alert (Post-procedure): Post Debridement Measurements of Total Wound Length: (cm) 2 Width: (cm) 1 Depth: (cm) 0.6 Volume: (cm) 0.942 Character of Wound/Ulcer Post Improved Debridement: Severity of Tissue Post Debridement: Fat layer exposed Post Procedure Diagnosis Same as Pre-procedure Electronic Signature(s) Signed: 07/07/2019 5:39:09 PM By: Cassandria Anger Signed: 11/03/2019 3:07:45 PM By: Yevonne Pax RN Entered By: Yevonne Pax on 07/07/2019 13:44:12 -------------------------------------------------------------------------------- HPI Details Patient Name: Date of Service: Andrew Schultz, Andrew Schultz 07/07/2019 1:00 PM Medical Record [id number] Patient Account Number:  [id number] Date of Birth/Sex: Treating RN: 1946-05-01 (73 y.o. [name] Primary Care Provider: Nita Sells Other Clinician: Referring Provider: Treating Provider/Extender:Robb Sibal, Ty Hilts, [name] in Treatment: 28 History of Present Illness HPI Description: 12/10/16; this is a 73 year old man who is a type I diabetic with an insulin pump. He is a retired Scientist, clinical (histocompatibility and immunogenetics). 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 He also has a history of diabetic angiopathy although this was previously worked up in [place]. He was told he had small vessel disease they could not be further revascularized. 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 He tells me he was cared for for a period of time in [place] had a wound care center and had hyperbaric oxygen. The patient also has rheumatoid arthritis and is on methotrexate. The patient states he has been dealing with a wound on the right medial ankle since October. He has been applying Silvadene based dressings to this. He was seen in the ER on 10/31/16 at that point felt to have cellulitis and was given prescriptions for Augmentin and Bactrim. He was apparently given a podiatry follow-up. 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 There was diffuse soft tissue swelling about the ankle. He also had a duplex ultrasound of the right leg that was negative for DVT ABIs in this clinic were noncompressible 12/28/16; most of the information the patient brought back from [place] was from 2013 at that point he had skin ulcers and suspicion of osteomyelitis. MRI of the right foot showed a Charcot foot he had osteomyelitis in the fourth and fifth metatarsals. He has had  removal of the fifth metatarsal on the left foot on 6/24 2011 amputation of the right fourth and one half of the great toe amputation of the left second toe left  fifth toe. He saw vascular surgery in 2011. At that point the had non-invasive studies showing tibial artery disease Doppler waveforms were within normal limits. There was no evidence of arterial insufficiency of the left leg the right great toe pressure was unable to be obtained secondary to an amputation of the left great toe pressure was above the healing index. It was not felt that he would be a candidate for intervention at that point he was felt to have microvascular blood supply issues. It is very clear he will need follow-up vascular studies and probably venous reflux studies is aware of the right leg. We also have information from his podiatric surgeons in [facility] x-ray of the right foot showed severe destruction of the ankle joint secondary to Charcot form deformity. There are Charcot changes visible to the ankle and midfoot joints. He was referred here. He has been using Santyl in the interim while he was traveling in [place] 01/04/17; not much change here. We have been using Santyl. He has arterial and venous studies next week asks that we can see him in 2 weeks which is reasonable. He had an ultrasound of his right leg rule out DVT in early December and they comment on no venous reflux however this was not a true reflux study. He clearly has venous insufficiency 01/18/17; the patient has undergone his ordered studies. His VENOUS REFLUX studies show no evidence of a DVT or superficial vein thrombophlebitis. There is evidence of great saphenous vein reflux in the right lower extremity as well as reflux in the common femoral and popliteal vein. No reflux was noted in the small saphenous vein on the right. ARTERIAL studies showed an ABI on the right of 1.54, waveforms were biphasic at the posterior tibial  and dorsalis pedis on the right. He could not have a TBi on the right secondary to prior amputation. There was a suggestion of falsely elevated left TBI due to calcified vessel with a left TBI of 0.95. The patient will definitely need to see vascular surgery with regards to his arterial and venous systems. 02/01/17; appointment with vascular surgery on April 4. Still using Santyl. 02/15/17; the patient was changed to Prisma 2 weeks ago at the time the wound was small but with some depth but a healthy base. Apparently everything was going well to 2 or 3 days ago when the patient and his wife noted deterioration wound. He came in today on his usual appointment. He has vascular surgery appointment on April 4. Considerable deterioration today necrotic material,. Drainage and exposed bone. Mild degree of erythema around the wound which was marked. Change the primary dressing to silver alginate. Augmentin and doxycycline until culture comes back. This was done dark drainage. MRI of the foot is been ordered. He tells me that he has previously had osteomyelitis but I don't believe in this area 02/21/17; I saw this patient a week ago with a marked deterioration in the wound over his right medial malleolus. Culture at the time showed methicillin sensitive staph aureus. I've emperically put him on doxycycline on Augmentin last week. In the meantime he noted increasing swelling and drainage and was admitted to hospital from 3/25 through 3/27. He was given vancomycin however he was discharged on doxycycline for 10 days. He is still using silver alginate to the wound bed. He comes back today with a necrotic wound surface easily palpable friable necrotic bone underneath this. He has not been systemically unwell. He tells me he is trying  his best offload this area and walking minimally. He has not been systemically unwell. MRI of the right hindfoot should've showed severe Charcot arthropathy involving the ankle and  hindfoot. There is chronic partial destruction of the distal tibia and fibula, calcaneus and navicular. The navicular is posteriorly displaced. The talus is not definitely seen either destroyed her previously resected. With regards the ulcer this was noted. The wound abuts on the medial malleolus there is thick periosteal thickening but no gross cortical destruction there is a complex fluid collection at this aortic relation between the distal tibia and the calcaneus the feeling was that this was not osteomyelitis given the extensive Charcot changes but osteomyelitis could not definitely be excluded 03/01/17- patient is here for follow-up evaluation of his right medial foot/ankle ulceration. Did have tissue culture and bone pathology sent last week. Bone pathology reveals chronic osteomyelitis, no malignancy. The tissue culture resulted oxacillin sensitive staph aureus. He is currently on Keflex. 03/04/17; the patient's pathology showed chronic osteomyelitis. Bone culture revealed methicillin sensitive staph aureus he is on Keflex. He arrives today in clinic with a piece of bone protruding out of the wound 03/11/17; with considerable difficulty and help of our staff 5 min able to get the patient appointment on Thursday with infectious disease and subtotally next Monday morning with Dr. Bennett Scrape podiatric ankle surgeon with [facility] with regards to the Charcot right ankle joint and underlying infection. He has been using silver alginate and remains on Keflex 03/18/17; the patient saw Dr. Bennett Scrape who outlined to surgical options 1 a below-knee amputation and the other a debridement of the underlying fibula possibly part of the tibia with an external fixator. Pending the patient is leaning towards the amputation as even if this could possibly result in a functional limb he would be at risk for further Charcot complications in the future. We are using silver alginate to the wound and continuing Keflex. If he  opts for an aggressive posture to attempt to save his leg then he'll probably need IV antibiotics and could be considered for hyperbaric oxygen however I doubt that's what he'll decide given his tone today READMISSION 12/22/2018 This is a patient with type 1 diabetes, severe peripheral neuropathy and severe Charcot foot and ankle deformities. We had him here in 2018 actually looking an area on the right foot. This did not heal in fact became worse and secondarily infected. He saw Dr. Bennett Scrape of podiatric surgery at [facility]. 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 He has a prosthesis and was doing well. He tells me that late in October he developed a small blister on the medial aspect of his left first metatarsal head. He was followed by podiatry initially with Iodosorb and then 2 weeks ago with Santyl to the wound area. His wife changes the dressings. 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 Since he was last here he had a left third toe amputation which healed well. ABIs in this clinic on the left were noncompressible 2/10; the patient's area on the medial aspect of the left great toe. Since the last time the patient was here 2 weeks ago this is actually deteriorated now  with exposed tendon. He has been using Santyl. The patient had arterial studies done in February 2018. On the left this showed noncompressible vessels bilaterally but with a TBI of 0.95. Waveforms were biphasic at the posterior tibial and dorsalis pedis. 2/17; x-ray from last week showed the third toe amputation. Medial soft tissue ulceration without evidence  of osteomyelitis. We have been using Santyl. His arterial studies to compare with the ones done 2 years ago are tomorrow. Run Grafix PL through his insurance 2/25. Arterial studies were done on 2/18. On the left this showed noncompressible vessels but with triphasic wave waveforms at the PTA and biphasic at the dorsalis pedis. TBI was 0.55 down from 0.95 2 years ago. His wound is on the medial left first metatarsal head. This does not have a viable surface dry and exposed tendon. 3/10/; patient did not have his vascular consult done because of an error in sending the orders. This will be done today. He arrives today with some erythema around the wound nothing but exposed tendon. I have been keeping this moist with hydrogel and silver collagen. He will need an MRI a vascular surgery consult. 3/24; he still not has not seen vascular surgery however he was admitted to hospital from 3/12 through 02/07/2019. He was felt to have acute osteomyelitis of the left foot. He was seen by his podiatrist at Dr. Allena Katz. There were no cultures done. He was initially treated with vancomycin cefepime and Flagyl however discharged on vancomycin and Augmentin. He has MRI documented septic arthritis and osteomyelitis of the first metatarsal head and neck. The patient arrived in clinic with a sheet of concerns predominantly that advanced home care did not have orders beyond Thursday morning. I E as antibiotics would stop. 3/31; he is now on vancomycin and ertapenem as directed by infectious disease. We have everybody on board. He is going for his angiogram tomorrow although I have not yet reviewed vein and vascular's notes. We have been using Santyl on the wound bed 4/7; he apparently had some renal insufficiency issues and his vancomycin was temporarily on hold. He did have his angiogram done on 4/1; this did not show any significant stenosis. There was three-vessel runoff. The dominant vessel was the anterior tibial.  There was some evidence of digital arterial atherosclerotic disease but certainly nothing that required an intervention from a macrovascular point of view. In preparation for possible HBO I am going to order a hemoglobin A1c. Also notable that he has a low albumin level. He has had a reduction in his appetite which he is attributing to antibiotic therapy. 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 I will need to research what we know about this on Shirley link i.e. has he ever had urine studies 4/14; his vancomycin is still on hold as far as the patient is aware. I find this somewhat unusual unless there is more to this than I realized. We have been using Santyl to the wound 4/21; patient seen in conjunction with HBO. I did get some lab work from home health. On 4/31 his vancomycin level was in the 90s. Essentially it is been on hold since then. Lab work that was sent to me today dated 4/20 shows a vancomycin level of 17.8. Fortunately his BUN and creatinine are both normal at 20 and 1.0. His white count is 5.6 hemoglobin 11.5. I also have from 4/17 a sedimentation rate of 96 and a C-reactive protein of 20 although the patient has rheumatoid  arthritis. He also tells me that his methotrexate and biologic agent have been put on hold by his rheumatologist because of the underlying osteomyelitis. Finally I am not exactly sure who is following his antibiotics after discussion with the patient today. I did involve Dr. Orvan Falconer via secure text I think when it became clear that his antibiotics are running out after his hospitalization. I also discussed things with his primary doctor Dr. Dwana Melena in [place]. But I am not exactly sure who is following the labs. He has been tolerating HBO well. 4/28; the patient is tolerating HBO well. There is been improvements in his wound dimension especially the undermining at 1:00 towards the medial foot. Today measured at 1.2 cm. We have been  in contact with home health. The antibiotics finished on Sunday. He still has his PICC line in place. C-reactive protein and sedimentation rate were measured yesterday. His methotrexate and biologic agent are still on hold he has not had any trouble with increasing pain from his RA 5/5; the patient is tolerating HBO well. He is seen today in conjunction with that his wound is improved had been improving although it is not much different from last week. Undermining towards the medial foot today measured at 1.8 up from 1.2 last week. Went to see Dr. Luciana Axe of ophthalmology. Fortunately did not have anything major wrong. Most of the symptoms he was having in his eyes were related to pseudophakia. He feels better today and he tolerated his treatment well. We are using endoform. Previously his insurance which is through [place] [employer] had refused them for Grafix. I am going to run Dermagraft I also looked over his inflammatory markers his sed rate is still over 100. I do not know how to interpret this in this patient who has rheumatoid arthritis. I am not sure that the persistence of an elevated sed rate in this man means of treatment failure 5/12; the patient was seen today for wound care evaluation in conjunction with hyperbarics that he is tolerating well. We have been using endoform. The undermining area to along the medial aspect of his foot and come down nicely however on our evaluation today that is gone back up to 4 cm which is really very disappointing. Other than that the wound bed paradoxically looks somewhat better 5/19- Patient seen after HBO, the ulcer now undermines to 3.5 cm which is better than 4.0 last time 5/26; patient was seen after HBO. He apparently had a single Dermagraft applied last week. I applied Dermagraft No. 2. Careful attention to the tunneling along the medial foot which currently measures 3.5 cm about the same as last time 6/2; patient was seen after HBO.  Dermagraft No. 3 applied. Tunneling area improved condition of the wound bed and improved. 6/9; patient was seen after HBO. The tunneling area is about 1 cm about the same as last time. Dermagraft No. 4 applied. We are also going to recertify him for hyperbarics which is really seems to have helped the wound healing process. 6/16; patient was seen after HBO. The tunneling area has deepened this week towards the medial part of his foot but that had come in last week. Nevertheless the wound bed looks healthy. Dermagraft No. 5 applied after debridement 6/23. Patient was seen after HBO tunneling area is down from 4 to 3.6 cm. I think we have had some contraction of the surface area. There is no palpable bone. No evidence of infection. Dermagraft No. 6 applied 6/30; the tunneling expanded  once again to 7 cm. Yet the wound itself looks actually quite good. Dermagraft No. 7 7/7; tunneling at 4.5. Wound dimensions are down from last week especially in width. Dermagraft No. 8 7/14; the tunneling is down in the wound has less with. What I can see the base of the wound looks healthy. We will change him to endoform AG. He continues along with HBO we are making progress albeit slow 7/21; tunneling in the most proximal part of this wound is at 1 cm. The rest of this is down to a linear slitlike area. Not much with. No evidence of infection 7/28; the tunneling and the most proximal part of this wound is down to 0.3 cm measured by myself otherwise it remains a linear slitlike area. The base of this appears to be healthy. No evidence of infection. He is seen today along with hyperbarics 8/11-Left medial foot wound has covering of eschar-like tissue but it is otherwise small opening. Electronic Signature(s) Signed: 07/07/2019 2:02:35 PM By: Cassandria Anger Entered By: Cassandria Anger on 07/07/2019 14:02:35 -------------------------------------------------------------------------------- Physical Exam  Details Patient Name: Date of Service: Andrew Schultz, Andrew Schultz 07/07/2019 1:00 PM Medical Record [id number] Patient Account Number: [id number] Date of Birth/Sex: Treating RN: [birth year] (73 y.o. [name]) Yevonne Pax Primary Care Provider: Nita Sells Other Clinician: Referring Provider: Treating Provider/Extender:Marlana Mckowen, Ty Hilts, [name] in Treatment: 28 Constitutional alert and oriented x 3. sitting or standing blood pressure is within target range for patient.. supine blood pressure is within target range for patient.. pulse regular and within target range for patient.Marland Kitchen respirations regular, non-labored and within target range for patient.Marland Kitchen temperature within target range for patient.. . . Well-nourished and well-hydrated in no acute distress. Notes The left medial foot wound has an eschar like covering which was removed with scalpel and forceps, the surrounding skin looks intact, the base of the wound looks healthy with not any covering of slough Electronic Signature(s) Signed: 07/07/2019 2:03:09 PM By: Cassandria Anger Entered By: Cassandria Anger on 07/07/2019 14:03:09 -------------------------------------------------------------------------------- Physician Orders Details Patient Name: Date of Service: Andrew Schultz, Andrew Schultz 07/07/2019 1:00 PM Medical Record [id number] Patient Account Number: [id number] Date of Birth/Sex: Treating RN: Nov 15, 1946 (73 y.o. [name]) Yevonne Pax Primary Care Provider: Nita Sells Other Clinician: Referring Provider: Treating Provider/Extender:Tal Kempker, Ty Hilts, [name] in Treatment: 54 Verbal / Phone Orders: No Diagnosis Coding ICD-10 Coding Code Description [phone] Subacute osteomyelitis, left ankle and foot L97.524 Non-pressure chronic ulcer of other part of left foot with necrosis of bone E10.621 Type 1 diabetes mellitus with foot ulcer E10.40 Type 1 diabetes mellitus with diabetic neuropathy, unspecified M14.672 Charcot's joint, left ankle and  foot Z89.511 Acquired absence of right leg below knee Follow-up Appointments Return Appointment in 1 week. Dressing Change Frequency Change Dressing every other day. Skin Barriers/Peri-Wound Care Barrier cream Wound Cleansing Wound #2 Left,Medial Foot May shower and wash wound with soap and water. Primary Wound Dressing Other: - endoform ag Secondary Dressing Wound #2 Left,Medial Foot Kerlix/Rolled Gauze ABD pad Other: - secure with tape Electronic Signature(s) Signed: 07/07/2019 5:39:09 PM By: Cassandria Anger Signed: 11/03/2019 3:07:45 PM By: Yevonne Pax RN Entered By: Yevonne Pax on 07/07/2019 13:30:15 -------------------------------------------------------------------------------- Problem List Details Patient Name: Date of Service: Andrew Schultz, Andrew Schultz 07/07/2019 1:00 PM Medical Record [id number] Patient Account Number: [id number] Date of Birth/Sex: Treating RN: 1946-02-08 (73 y.o. [name] Primary Care Provider: Nita Sells Other Clinician: Referring Provider: Treating Provider/Extender:Joyia Riehle, Ty Hilts, [name] in Treatment: 28 Active Problems ICD-10 Evaluated Encounter Code Description Active Date Today Diagnosis [phone]  Subacute osteomyelitis, left ankle and foot 02/17/2019 No Yes L97.524 Non-pressure chronic ulcer of other part of left foot 02/17/2019 No Yes with necrosis of bone E10.621 Type 1 diabetes mellitus with foot ulcer 12/22/2018 No Yes E10.40 Type 1 diabetes mellitus with diabetic neuropathy, 12/22/2018 No Yes unspecified M14.672 Charcot's joint, left ankle and foot 12/22/2018 No Yes Z89.511 Acquired absence of right leg below knee 12/22/2018 No Yes Inactive Problems Resolved Problems Electronic Signature(s) Signed: 07/07/2019 5:39:09 PM By: Cassandria AngerMadduri, Dariush Mcnellis Signed: 11/03/2019 3:07:45 PM By: Yevonne PaxEpps, Carrie RN Entered By: Yevonne PaxEpps, Carrie on 07/07/2019 13:29:45 -------------------------------------------------------------------------------- Progress  Note Details Patient Name: Date of Service: Andrew Schultz, Andrew Schultz 07/07/2019 1:00 PM Medical Record [id number] Patient Account Number: [id number] Date of Birth/Sex: Treating RN: [birth year] (73 y.o. Melonie [place]M) Epps, Carrie Primary Care Provider: Nita SellsHALL, Rayvon Other Clinician: Referring Provider: Treating Provider/Extender:Wenda Vanschaick, Ty HiltsMurthy HALL, Vivianne SpenceJOHN Weeks in Treatment: 28 Subjective History of Present Illness (HPI) 12/10/16; this is a 73 year old man who is a type I diabetic with an insulin pump. 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 He also has a history of diabetic angiopathy although this was previously worked up in [place]. He was told he had small vessel disease they could not be further revascularized. 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 He tells me he was cared for for a period of time in [place] had a wound care center and had hyperbaric oxygen. The patient also has rheumatoid arthritis and is on methotrexate. The patient states he has been dealing with a wound on the right medial ankle since October. He has been applying Silvadene based dressings to this. He was seen in the ER on 10/31/16 at that point felt to have cellulitis and was given prescriptions for Augmentin and Bactrim. He was apparently given a podiatry follow-up. 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 There was diffuse soft tissue swelling about the ankle. He also had a duplex ultrasound of the right leg that was negative for DVT ABIs in this clinic were noncompressible 12/28/16; most of the information the patient brought back from [place] was from 2013 at that point he had skin ulcers and  suspicion of osteomyelitis. MRI of the right foot showed a Charcot foot he had osteomyelitis in the fourth and fifth metatarsals. He has had removal of the fifth metatarsal on the left foot on 6/24 2011 amputation of the right fourth and one half of the great toe amputation of the left second toe left fifth toe. He saw vascular surgery in 2011. At that point the had non-invasive studies showing tibial artery disease Doppler waveforms were within normal limits. There was no evidence of arterial insufficiency of the left leg the right great toe pressure was unable to be obtained secondary to an amputation of the left great toe pressure was above the healing index. It was not felt that he would be a candidate for intervention at that point he was felt to have microvascular blood supply issues. It is very clear he will need follow-up vascular studies and probably venous reflux studies is aware of the right leg. We also have information [facility] [employer] x-[place] of the right foot showed severe destruction of the ankle joint  secondary to Charcot form deformity. There are Charcot changes visible to the ankle and midfoot joints. He was referred here. He has been using Santyl in the interim while he was traveling in [place] 01/04/17; not much change here. We have been using Santyl. He has arterial and venous studies next week asks that we can see him in 2 weeks which is reasonable. He had an ultrasound of his right leg rule out DVT in early December and they comment on no venous reflux however this was not a true reflux study. He clearly has venous insufficiency 01/18/17; the patient has undergone his ordered studies. His VENOUS REFLUX studies show no evidence of a DVT or superficial vein thrombophlebitis. There is evidence of great saphenous vein reflux in the right lower extremity as well as reflux in the common femoral and popliteal vein. No reflux was noted in the  small saphenous vein on the right. ARTERIAL studies showed an ABI on the right of 1.54, waveforms were biphasic at the posterior tibial and dorsalis pedis on the right. He could not have a TBi on the right secondary to prior amputation. There was a suggestion of falsely elevated left TBI due to calcified vessel with a left TBI of 0.95. The patient will definitely need to see vascular surgery with regards to his arterial and venous systems. 02/01/17; appointment with vascular surgery on April 4. Still using Santyl. 02/15/17; the patient was changed to Prisma 2 weeks ago at the time the wound was small but with some depth but a healthy base. Apparently everything was going well to 2 or 3 days ago when the patient and his wife noted deterioration wound. He came in today on his usual appointment. He has vascular surgery appointment on April 4. Considerable deterioration today necrotic material,. Drainage and exposed bone. Mild degree of erythema around the wound which was marked. Change the primary dressing to silver alginate. Augmentin and doxycycline until culture comes back. This was done dark drainage. MRI of the foot is been ordered. He tells me that he has previously had osteomyelitis but I don't believe in this area 02/21/17; I saw this patient a week ago with a marked deterioration in the wound over his right medial malleolus. Culture at the time showed methicillin sensitive staph aureus. I've emperically put him on doxycycline on Augmentin last week. In the meantime he noted increasing swelling and drainage and was admitted to hospital from 3/25 through 3/27. He was given vancomycin however he was discharged on doxycycline for 10 days. He is still using silver alginate to the wound bed. He comes back today with a necrotic wound surface easily palpable friable necrotic bone underneath this. He has not been systemically unwell. He tells me he is trying his best offload this area and walking  minimally. He has not been systemically unwell. 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 There is chronic partial destruction of the distal tibia and fibula, calcaneus and navicular. The navicular is posteriorly displaced. The talus is not definitely seen either destroyed her previously resected. With regards the ulcer this was noted. The wound abuts on the medial malleolus there is thick periosteal thickening but no gross cortical destruction there is a complex fluid collection at this aortic relation between the distal tibia and the calcaneus the feeling was that this was not osteomyelitis given the extensive Charcot changes but osteomyelitis could not definitely be excluded 03/01/17- patient is here for follow-up evaluation of his  right medial foot/ankle ulceration. Did have tissue culture and bone pathology sent last week. Bone pathology reveals chronic osteomyelitis, no malignancy. The tissue culture resulted oxacillin sensitive staph aureus. He is currently on Keflex. 03/04/17; the patient's pathology showed chronic osteomyelitis. Bone culture revealed methicillin sensitive staph aureus he is on Keflex. He arrives today in clinic with a piece of bone protruding out of the wound 03/11/17; with considerable difficulty and help of our staff 5 min able to get the patient appointment on Thursday with infectious disease and subtotally next Monday morning with Dr. Prudy Feeler podiatric ankle surgeon with [facility] with regards to the Charcot right ankle joint and underlying infection. He has been using silver alginate and remains on Keflex 03/18/17; the patient saw Dr. Prudy Feeler who outlined to surgical options 1 a below-knee amputation and the other a debridement of the underlying fibula possibly part of the tibia with an external fixator. Pending the patient is leaning towards the amputation as even if this could possibly result in a functional limb  he would be at risk for further Charcot complications in the future. We are using silver alginate to the wound and continuing Keflex. If he opts for an aggressive posture to attempt to save his leg then he'll probably need IV antibiotics and could be considered for hyperbaric oxygen however I doubt that's what he'll decide given his tone today READMISSION 12/22/2018 This is a patient with type 1 diabetes, severe peripheral neuropathy and severe Charcot foot and ankle deformities. We had him here in 2018 actually looking an area on the right foot. This did not heal in fact became worse and secondarily infected. He saw Dr. Prudy Feeler of podiatric surgery at [facility]. 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 He has a prosthesis and was doing well. He tells me that late in October he developed a small blister on the medial aspect of his left first metatarsal head. He was followed by podiatry initially with Iodosorb and then 2 weeks ago with Santyl to the wound area. His wife changes the dressings. 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 Since he was last here he had a left third toe amputation which healed well. ABIs in this clinic on the left were noncompressible 2/10; the patient's area on the medial aspect of the left great toe. Since the last time the patient was here 2 weeks ago this is actually deteriorated now with exposed tendon. He has been using Santyl. The patient had arterial studies done in February 2018. On the left this showed noncompressible vessels bilaterally but with a TBI of 0.95. Waveforms were biphasic at the  posterior tibial and dorsalis pedis. 2/17; x-ray from last week showed the third toe amputation. Medial soft tissue ulceration without evidence of osteomyelitis. We have been using Santyl. His arterial studies to compare with the ones done 2 years ago are tomorrow. Run Grafix PL through his insurance 2/25. Arterial studies were done on 2/18. On the left this showed noncompressible vessels but with triphasic wave waveforms at the PTA and biphasic at the dorsalis pedis. TBI was 0.55 down from 0.95 2 years ago. His wound is on the medial left first  metatarsal head. This does not have a viable surface dry and exposed tendon. 3/10/; patient did not have his vascular consult done because of an error in sending the orders. This will be done today. He arrives today with some erythema around the wound nothing but exposed tendon. I have been keeping this moist with hydrogel and silver collagen. He will need an MRI a vascular surgery consult. 3/24; he still not has not seen vascular surgery however he was admitted to hospital from 3/12 through 02/07/2019. He was felt to have acute osteomyelitis of the left foot. He was seen by his podiatrist at Dr. Allena Katz. There were no cultures done. He was initially treated with vancomycin cefepime and Flagyl however discharged on vancomycin and Augmentin. He has MRI documented septic arthritis and osteomyelitis of the first metatarsal head and neck. The patient arrived in clinic with a sheet of concerns predominantly that advanced home care did not have orders beyond Thursday morning. I E as antibiotics would stop. 3/31; he is now on vancomycin and ertapenem as directed by infectious disease. We have everybody on board. He is going for his angiogram tomorrow although I have not yet reviewed vein and vascular's notes. We have been using Santyl on the wound bed 4/7; he apparently had some renal insufficiency issues and his vancomycin was temporarily on hold. He did have his  angiogram done on 4/1; this did not show any significant stenosis. There was three-vessel runoff. The dominant vessel was the anterior tibial. There was some evidence of digital arterial atherosclerotic disease but certainly nothing that required an intervention from a macrovascular point of view. In preparation for possible HBO I am going to order a hemoglobin A1c. Also notable that he has a low albumin level. He has had a reduction in his appetite which he is attributing to antibiotic therapy. 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 I will need to research what we know about this on [facility] link i.e. has he ever had urine studies 4/14; his vancomycin is still on hold as far as the patient is aware. I find this somewhat unusual unless there is more to this than I realized. We have been using Santyl to the wound 4/21; patient seen in conjunction with HBO. I did get some lab work from home health. On 4/31 his vancomycin level was in the 90s. Essentially it is been on hold since then. Lab work that was sent to me today dated 4/20 shows a vancomycin level of 17.8. Fortunately his BUN and creatinine are both normal at 20 and 1.0. His white count is 5.6 hemoglobin 11.5. I also have from 4/17 a sedimentation rate of 96 and a C-reactive protein of 20 although the patient has rheumatoid arthritis. He also tells me that his methotrexate and biologic agent have been put on hold by his rheumatologist because of the underlying osteomyelitis. Finally I am not exactly sure who is following his antibiotics after discussion with the patient today. I did involve Dr. Orvan Falconer via secure text I think when it became clear that his antibiotics are running out after his hospitalization. I also discussed things with his primary doctor Dr. Dwana Melena in [place]. But I am not exactly sure who is following the labs. He has been tolerating HBO well. 4/28; the patient is tolerating HBO well. There  is been improvements in his wound dimension especially the undermining at 1:00 towards the medial foot. Today measured at 1.2 cm. We have been in contact with  home health. The antibiotics finished on Sunday. He still has his PICC line in place. C-reactive protein and sedimentation rate were measured yesterday. His methotrexate and biologic agent are still on hold he has not had any trouble with increasing pain from his RA 5/5; the patient is tolerating HBO well. He is seen today in conjunction with that his wound is improved had been improving although it is not much different from last week. Undermining towards the medial foot today measured at 1.8 up from 1.2 last week. Went to see Dr. Luciana Axe of ophthalmology. Fortunately did not have anything major wrong. Most of the symptoms he was having in his eyes were related to pseudophakia. He feels better today and he tolerated his treatment well. We are using endoform. Previously his insurance which is through [place] [employer] had refused them for Grafix. I am going to run Dermagraft I also looked over his inflammatory markers his sed rate is still over 100. I do not know how to interpret this in this patient who has rheumatoid arthritis. I am not sure that the persistence of an elevated sed rate in this man means of treatment failure 5/12; the patient was seen today for wound care evaluation in conjunction with hyperbarics that he is tolerating well. We have been using endoform. The undermining area to along the medial aspect of his foot and come down nicely however on our evaluation today that is gone back up to 4 cm which is really very disappointing. Other than that the wound bed paradoxically looks somewhat better 5/19- Patient seen after HBO, the ulcer now undermines to 3.5 cm which is better than 4.0 last time 5/26; patient was seen after HBO. He apparently had a single Dermagraft applied last week. I applied Dermagraft No. 2. Careful  attention to the tunneling along the medial foot which currently measures 3.5 cm about the same as last time 6/2; patient was seen after HBO. Dermagraft No. 3 applied. Tunneling area improved condition of the wound bed and improved. 6/9; patient was seen after HBO. The tunneling area is about 1 cm about the same as last time. Dermagraft No. 4 applied. We are also going to recertify him for hyperbarics which is really seems to have helped the wound healing process. 6/16; patient was seen after HBO. The tunneling area has deepened this week towards the medial part of his foot but that had come in last week. Nevertheless the wound bed looks healthy. Dermagraft No. 5 applied after debridement 6/23. Patient was seen after HBO tunneling area is down from 4 to 3.6 cm. I think we have had some contraction of the surface area. There is no palpable bone. No evidence of infection. Dermagraft No. 6 applied 6/30; the tunneling expanded once again to 7 cm. Yet the wound itself looks actually quite good. Dermagraft No. 7 7/7; tunneling at 4.5. Wound dimensions are down from last week especially in width. Dermagraft No. 8 7/14; the tunneling is down in the wound has less with. What I can see the base of the wound looks healthy. We will change him to endoform AG. He continues along with HBO we are making progress albeit slow 7/21; tunneling in the most proximal part of this wound is at 1 cm. The rest of this is down to a linear slitlike area. Not much with. No evidence of infection 7/28; the tunneling and the most proximal part of this wound is down to 0.3 cm measured by myself otherwise it remains a linear slitlike  area. The base of this appears to be healthy. No evidence of infection. He is seen today along with hyperbarics 8/11-Left medial foot wound has covering of eschar-like tissue but it is otherwise small opening. Objective Constitutional alert and oriented x 3. sitting or standing blood pressure is  within target range for patient.. supine blood pressure is within target range for patient.. pulse regular and within target range for patient.Marland Kitchen respirations regular, non-labored and within target range for patient.Marland Kitchen temperature within target range for patient.. Well-nourished and well-hydrated in no acute distress. Vitals Time Taken: 1:08 PM, Height: 67 in, Weight: 208 lbs, BMI: 32.6, Temperature: 98.7 F, Pulse: 82 bpm, Respiratory Rate: 15 breaths/min, Blood Pressure: 103/45 mmHg, Capillary Blood Glucose: 140 mg/dl. General Notes: The left medial foot wound has an eschar like covering which was removed with scalpel and forceps, the surrounding skin looks intact, the base of the wound looks healthy with not any covering of slough Integumentary (Hair, Skin) Wound #2 status is Open. Original cause of wound was Gradually Appeared. The wound is located on the Left,Medial Foot. The wound measures 0.5cm length x 0.3cm width x 0.6cm depth; 0.118cm^2 area and 0.071cm^3 volume. There is Fat Layer (Subcutaneous Tissue) Exposed exposed. There is no tunneling noted, however, there is undermining starting at 12:00 and ending at 12:00 with a maximum distance of 0.8cm. There is a medium amount of serosanguineous drainage noted. The wound margin is flat and intact. There is large (67-100%) red granulation within the wound bed. There is no necrotic tissue within the wound bed. Assessment Active Problems ICD-10 Subacute osteomyelitis, left ankle and foot Non-pressure chronic ulcer of other part of left foot with necrosis of bone Type 1 diabetes mellitus with foot ulcer Type 1 diabetes mellitus with diabetic neuropathy, unspecified Charcot's joint, left ankle and foot Acquired absence of right leg below knee Procedures Wound #2 Pre-procedure diagnosis of Wound #2 is a Diabetic Wound/Ulcer of the Lower Extremity located on the Left,Medial Foot .Severity of Tissue Pre Debridement is: Fat layer exposed.  There was a Excisional Skin/Subcutaneous Tissue Debridement with a total area of 0.15 sq cm performed by Cassandria Anger, MD. With the following instrument(s): Curette to remove Viable and Non-Viable tissue/material. Material removed includes Callus, Subcutaneous Tissue, and Slough after achieving pain control using Lidocaine 5% topical ointment. No specimens were taken. A time out was conducted at 13:35, prior to the start of the procedure. A Minimum amount of bleeding was controlled with Pressure. The procedure was tolerated well with a pain level of 0 throughout and a pain level of 0 following the procedure. Post Debridement Measurements: 2cm length x 1cm width x 0.6cm depth; 0.942cm^3 volume. Character of Wound/Ulcer Post Debridement is improved. Severity of Tissue Post Debridement is: Fat layer exposed. Post procedure Diagnosis Wound #2: Same as Pre-Procedure Plan Follow-up Appointments: Return Appointment in 1 week. Dressing Change Frequency: Change Dressing every other day. Skin Barriers/Peri-Wound Care: Barrier cream Wound Cleansing: Wound #2 Left,Medial Foot: May shower and wash wound with soap and water. Primary Wound Dressing: Other: - endoform ag Secondary Dressing: Wound #2 Left,Medial Foot: Kerlix/Rolled Gauze ABD pad Other: - secure with tape 1. Continue with every other day dressing changes with endoform 2. Return to clinic next week Electronic Signature(s) Signed: 07/07/2019 2:03:38 PM By: Cassandria Anger Entered By: Cassandria Anger on 07/07/2019 14:03:38 -------------------------------------------------------------------------------- SuperBill Details Patient Name: Date of Service: Andrew Schultz, Andrew Schultz 07/07/2019 Medical Record [id number] Patient Account Number: [id number] Date of Birth/Sex: Treating RN: [birth year] (73 y.o. [name]) Jettie Pagan, Anna  Primary Care Provider: Nita SellsHALL, Reason Other Clinician: Referring Provider: Treating Provider/Extender:Jais Demir, Ty HiltsMurthy HALL,  Vivianne SpenceJOHN Weeks in Treatment: 28 Diagnosis Coding ICD-10 Codes Code Description [phone] Subacute osteomyelitis, left ankle and foot L97.524 Non-pressure chronic ulcer of other part of left foot with necrosis of bone E10.621 Type 1 diabetes mellitus with foot ulcer E10.40 Type 1 diabetes mellitus with diabetic neuropathy, unspecified M14.672 Charcot's joint, left ankle and foot Z89.511 Acquired absence of right leg below knee Facility Procedures CPT4 Code Description: [id number] 11042 - DEB SUBQ TISSUE 20 SQ CM/< ICD-10 Diagnosis Description L97.524 Non-pressure chronic ulcer of other part of left foot with Modifier: necrosis of Quantity: 1 bone Physician Procedures CPT4 Code Description: [id number] 11042 - WC PHYS SUBQ TISS 20 SQ CM ICD-10 Diagnosis Description L97.524 Non-pressure chronic ulcer of other part of left foot with Modifier: necrosis of Quantity: 1 bone Electronic Signature(s) Signed: 07/07/2019 2:03:49 PM By: Cassandria AngerMadduri, Michaeline Eckersley Entered By: Cassandria AngerMadduri, Alaena Strader on 07/07/2019 14:03:49

## 2019-11-03 NOTE — Progress Notes (Signed)
Andrew Schultz, Andrew D. ([id number]) Visit Report for 07/21/2019 Debridement Details Patient Name: Date of Service: Andrew Schultz, Andrew Schultz 07/21/2019 1:30 PM Medical Record [id number] Patient Account Number: [id number] Date of Birth/Sex: Treating RN: 04/07/1946 (73 y.o. Andrew Schultz) Yevonne PaxEpps, Carrie Primary Care Provider: Nita SellsHALL, Amir Other Clinician: Referring Provider: Treating Provider/Extender:Myron Lona, Natasha BenceMichael HALL, Vivianne SpenceJOHN Weeks in Treatment: 30 Debridement Performed for Wound #2 Left,Medial Foot Assessment: Performed By: Physician Maxwell Caulobson, Caitlinn Klinker G., MD Debridement Type: Debridement Severity of Tissue Pre Fat layer exposed Debridement: Level of Consciousness (Pre- Awake and Alert procedure): Pre-procedure Verification/Time Out Taken: Yes - 14:30 Start Time: 14:30 Pain Control: Lidocaine 5% topical ointment Total Area Debrided (L x W): 1.3 (cm) x 0.3 (cm) = 0.39 (cm) Tissue and other material Viable, Non-Viable, Callus, Subcutaneous debrided: Level: Skin/Subcutaneous Tissue Debridement Description: Excisional Instrument: Curette Bleeding: Minimum Hemostasis Achieved: Pressure End Time: 14:36 Procedural Pain: 0 Post Procedural Pain: 0 Response to Treatment: Procedure was tolerated well Level of Consciousness Awake and Alert (Post-procedure): Post Debridement Measurements of Total Wound Length: (cm) 1.3 Width: (cm) 0.3 Depth: (cm) 0.6 Volume: (cm) 0.184 Character of Wound/Ulcer Post Improved Debridement: Severity of Tissue Post Debridement: Fat layer exposed Post Procedure Diagnosis Same as Pre-procedure Electronic Signature(s) Signed: 07/21/2019 6:47:38 PM By: Baltazar Najjarobson, Trinity Hyland MD Signed: 11/03/2019 3:07:45 PM By: Yevonne PaxEpps, Carrie RN Entered By: Baltazar Najjarobson, Quashon Jesus on 07/21/2019 14:39:09 -------------------------------------------------------------------------------- HPI Details Patient Name: Date of Service: Andrew Schultz, Andrew 07/21/2019 1:30 PM Medical Record [id number] Patient Account Number:  [id number] Date of Birth/Sex: Treating RN: 04/07/1946 (73 y.o. Andrew Schultz) Epps, Carrie Primary Care Provider: Nita SellsHALL, Jae Other Clinician: Referring Provider: Treating Provider/Extender:Niki Payment, Natasha BenceMichael HALL, Vivianne SpenceJOHN Weeks in Treatment: 30 History of Present Illness HPI Description: 12/10/16; this is a 73 year old man who is a type I diabetic with an insulin pump. 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 He also has a history of diabetic angiopathy although this was previously worked up in [place]. He was told he had small vessel disease they could not be further revascularized. 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 He tells me he was cared for for a period of time in [place] had a wound care center and had hyperbaric oxygen. The patient also has rheumatoid arthritis and is on methotrexate. The patient states he has been dealing with a wound on the right medial ankle since October. He has been applying Silvadene based dressings to this. He was seen in the ER on 10/31/16 at that point felt to have cellulitis and was given prescriptions for Augmentin and Bactrim. He was apparently given a podiatry follow-up. 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 There was diffuse soft tissue swelling about the ankle. He also had a duplex ultrasound of the right leg that was negative for DVT ABIs in this clinic were noncompressible 12/28/16; most of the information the patient brought back from [place] was from 2013 at that point he had skin ulcers and suspicion of osteomyelitis. MRI of the right foot showed a Charcot foot he had osteomyelitis in the fourth and fifth metatarsals. He has had  removal of the fifth metatarsal on the left foot on 6/24 2011 amputation of the right fourth and one half of the great toe amputation of the left second toe left  fifth toe. He saw vascular surgery in 2011. At that point the had non-invasive studies showing tibial artery disease Doppler waveforms were within normal limits. There was no evidence of arterial insufficiency of the left leg the right great toe pressure was unable to be obtained secondary to an amputation of the left great toe pressure was above the healing index. It was not felt that he would be a candidate for intervention at that point he was felt to have microvascular blood supply issues. It is very clear he will need follow-up vascular studies and probably venous reflux studies is aware of the right leg. We also have information from his podiatric surgeons in [facility] x-ray of the right foot showed severe destruction of the ankle joint secondary to Charcot form deformity. There are Charcot changes visible to the ankle and midfoot joints. He was referred here. He has been using Santyl in the interim while he was traveling in [place] 01/04/17; not much change here. We have been using Santyl. He has arterial and venous studies next week asks that we can see him in 2 weeks which is reasonable. He had an ultrasound of his right leg rule out DVT in early December and they comment on no venous reflux however this was not a true reflux study. He clearly has venous insufficiency 01/18/17; the patient has undergone his ordered studies. His VENOUS REFLUX studies show no evidence of a DVT or superficial vein thrombophlebitis. There is evidence of great saphenous vein reflux in the right lower extremity as well as reflux in the common femoral and popliteal vein. No reflux was noted in the small saphenous vein on the right. ARTERIAL studies showed an ABI on the right of 1.54, waveforms were biphasic at the posterior tibial  and dorsalis pedis on the right. He could not have a TBi on the right secondary to prior amputation. There was a suggestion of falsely elevated left TBI due to calcified vessel with a left TBI of 0.95. The patient will definitely need to see vascular surgery with regards to his arterial and venous systems. 02/01/17; appointment with vascular surgery on April 4. Still using Santyl. 02/15/17; the patient was changed to Prisma 2 weeks ago at the time the wound was small but with some depth but a healthy base. Apparently everything was going well to 2 or 3 days ago when the patient and his wife noted deterioration wound. He came in today on his usual appointment. He has vascular surgery appointment on April 4. Considerable deterioration today necrotic material,. Drainage and exposed bone. Mild degree of erythema around the wound which was marked. Change the primary dressing to silver alginate. Augmentin and doxycycline until culture comes back. This was done dark drainage. MRI of the foot is been ordered. He tells me that he has previously had osteomyelitis but I don't believe in this area 02/21/17; I saw this patient a week ago with a marked deterioration in the wound over his right medial malleolus. Culture at the time showed methicillin sensitive staph aureus. I've emperically put him on doxycycline on Augmentin last week. In the meantime he noted increasing swelling and drainage and was admitted to hospital from 3/25 through 3/27. He was given vancomycin however he was discharged on doxycycline for 10 days. He is still using silver alginate to the wound bed. He comes back today with a necrotic wound surface easily palpable friable necrotic bone underneath this. He has not been systemically unwell. He tells me he is trying  his best offload this area and walking minimally. He has not been systemically unwell. MRI of the right hindfoot should've showed severe Charcot arthropathy involving the ankle and  hindfoot. There is chronic partial destruction of the distal tibia and fibula, calcaneus and navicular. The navicular is posteriorly displaced. The talus is not definitely seen either destroyed her previously resected. With regards the ulcer this was noted. The wound abuts on the medial malleolus there is thick periosteal thickening but no gross cortical destruction there is a complex fluid collection at this aortic relation between the distal tibia and the calcaneus the feeling was that this was not osteomyelitis given the extensive Charcot changes but osteomyelitis could not definitely be excluded 03/01/17- patient is here for follow-up evaluation of his right medial foot/ankle ulceration. Did have tissue culture and bone pathology sent last week. Bone pathology reveals chronic osteomyelitis, no malignancy. The tissue culture resulted oxacillin sensitive staph aureus. He is currently on Keflex. 03/04/17; the patient's pathology showed chronic osteomyelitis. Bone culture revealed methicillin sensitive staph aureus he is on Keflex. He arrives today in clinic with a piece of bone protruding out of the wound 03/11/17; with considerable difficulty and help of our staff 5 min able to get the patient appointment on Thursday with infectious disease and subtotally next Monday morning with Dr. Bennett Scrape podiatric ankle surgeon with [facility] with regards to the Charcot right ankle joint and underlying infection. He has been using silver alginate and remains on Keflex 03/18/17; the patient saw Dr. Bennett Scrape who outlined to surgical options 1 a below-knee amputation and the other a debridement of the underlying fibula possibly part of the tibia with an external fixator. Pending the patient is leaning towards the amputation as even if this could possibly result in a functional limb he would be at risk for further Charcot complications in the future. We are using silver alginate to the wound and continuing Keflex. If he  opts for an aggressive posture to attempt to save his leg then he'll probably need IV antibiotics and could be considered for hyperbaric oxygen however I doubt that's what he'll decide given his tone today READMISSION 12/22/2018 This is a patient with type 1 diabetes, severe peripheral neuropathy and severe Charcot foot and ankle deformities. We had him here in 2018 actually looking an area on the right foot. This did not heal in fact became worse and secondarily infected. He saw Dr. Bennett Scrape of podiatric surgery at [facility]. 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 He has a prosthesis and was doing well. He tells me that late in October he developed a small blister on the medial aspect of his left first metatarsal head. He was followed by podiatry initially with Iodosorb and then 2 weeks ago with Santyl to the wound area. His wife changes the dressings. 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 Since he was last here he had a left third toe amputation which healed well. ABIs in this clinic on the left were noncompressible 2/10; the patient's area on the medial aspect of the left great toe. Since the last time the patient was here 2 weeks ago this is actually deteriorated now  with exposed tendon. He has been using Santyl. The patient had arterial studies done in February 2018. On the left this showed noncompressible vessels bilaterally but with a TBI of 0.95. Waveforms were biphasic at the posterior tibial and dorsalis pedis. 2/17; x-ray from last week showed the third toe amputation. Medial soft tissue ulceration without evidence  of osteomyelitis. We have been using Santyl. His arterial studies to compare with the ones done 2 years ago are tomorrow. Run Grafix PL through his insurance 2/25. Arterial studies were done on 2/18. On the left this showed noncompressible vessels but with triphasic wave waveforms at the PTA and biphasic at the dorsalis pedis. TBI was 0.55 down from 0.95 2 years ago. His wound is on the medial left first metatarsal head. This does not have a viable surface dry and exposed tendon. 3/10/; patient did not have his vascular consult done because of an error in sending the orders. This will be done today. He arrives today with some erythema around the wound nothing but exposed tendon. I have been keeping this moist with hydrogel and silver collagen. He will need an MRI a vascular surgery consult. 3/24; he still not has not seen vascular surgery however he was admitted to hospital from 3/12 through 02/07/2019. He was felt to have acute osteomyelitis of the left foot. He was seen by his podiatrist at Dr. Allena Katz. There were no cultures done. He was initially treated with vancomycin cefepime and Flagyl however discharged on vancomycin and Augmentin. He has MRI documented septic arthritis and osteomyelitis of the first metatarsal head and neck. The patient arrived in clinic with a sheet of concerns predominantly that advanced home care did not have orders beyond Thursday morning. I E as antibiotics would stop. 3/31; he is now on vancomycin and ertapenem as directed by infectious disease. We have everybody on board. He is going for his angiogram tomorrow although I have not yet reviewed vein and vascular's notes. We have been using Santyl on the wound bed 4/7; he apparently had some renal insufficiency issues and his vancomycin was temporarily on hold. He did have his angiogram done on 4/1; this did not show any significant stenosis. There was three-vessel runoff. The dominant vessel was the anterior tibial.  There was some evidence of digital arterial atherosclerotic disease but certainly nothing that required an intervention from a macrovascular point of view. In preparation for possible HBO I am going to order a hemoglobin A1c. Also notable that he has a low albumin level. He has had a reduction in his appetite which he is attributing to antibiotic therapy. 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 I will need to research what we know about this on Shirley link i.e. has he ever had urine studies 4/14; his vancomycin is still on hold as far as the patient is aware. I find this somewhat unusual unless there is more to this than I realized. We have been using Santyl to the wound 4/21; patient seen in conjunction with HBO. I did get some lab work from home health. On 4/31 his vancomycin level was in the 90s. Essentially it is been on hold since then. Lab work that was sent to me today dated 4/20 shows a vancomycin level of 17.8. Fortunately his BUN and creatinine are both normal at 20 and 1.0. His white count is 5.6 hemoglobin 11.5. I also have from 4/17 a sedimentation rate of 96 and a C-reactive protein of 20 although the patient has rheumatoid  arthritis. He also tells me that his methotrexate and biologic agent have been put on hold by his rheumatologist because of the underlying osteomyelitis. Finally I am not exactly sure who is following his antibiotics after discussion with the patient today. I did involve Dr. Orvan Falconer via secure text I think when it became clear that his antibiotics are running out after his hospitalization. I also discussed things with his primary doctor Dr. Dwana Melena in [place]. But I am not exactly sure who is following the labs. He has been tolerating HBO well. 4/28; the patient is tolerating HBO well. There is been improvements in his wound dimension especially the undermining at 1:00 towards the medial foot. Today measured at 1.2 cm. We have been  in contact with home health. The antibiotics finished on Sunday. He still has his PICC line in place. C-reactive protein and sedimentation rate were measured yesterday. His methotrexate and biologic agent are still on hold he has not had any trouble with increasing pain from his RA 5/5; the patient is tolerating HBO well. He is seen today in conjunction with that his wound is improved had been improving although it is not much different from last week. Undermining towards the medial foot today measured at 1.8 up from 1.2 last week. Went to see Dr. Luciana Axe of ophthalmology. Fortunately did not have anything major wrong. Most of the symptoms he was having in his eyes were related to pseudophakia. He feels better today and he tolerated his treatment well. We are using endoform. Previously his insurance which is through [place] [employer] had refused them for Grafix. I am going to run Dermagraft I also looked over his inflammatory markers his sed rate is still over 100. I do not know how to interpret this in this patient who has rheumatoid arthritis. I am not sure that the persistence of an elevated sed rate in this man means of treatment failure 5/12; the patient was seen today for wound care evaluation in conjunction with hyperbarics that he is tolerating well. We have been using endoform. The undermining area to along the medial aspect of his foot and come down nicely however on our evaluation today that is gone back up to 4 cm which is really very disappointing. Other than that the wound bed paradoxically looks somewhat better 5/19- Patient seen after HBO, the ulcer now undermines to 3.5 cm which is better than 4.0 last time 5/26; patient was seen after HBO. He apparently had a single Dermagraft applied last week. I applied Dermagraft No. 2. Careful attention to the tunneling along the medial foot which currently measures 3.5 cm about the same as last time 6/2; patient was seen after HBO.  Dermagraft No. 3 applied. Tunneling area improved condition of the wound bed and improved. 6/9; patient was seen after HBO. The tunneling area is about 1 cm about the same as last time. Dermagraft No. 4 applied. We are also going to recertify him for hyperbarics which is really seems to have helped the wound healing process. 6/16; patient was seen after HBO. The tunneling area has deepened this week towards the medial part of his foot but that had come in last week. Nevertheless the wound bed looks healthy. Dermagraft No. 5 applied after debridement 6/23. Patient was seen after HBO tunneling area is down from 4 to 3.6 cm. I think we have had some contraction of the surface area. There is no palpable bone. No evidence of infection. Dermagraft No. 6 applied 6/30; the tunneling expanded  once again to 7 cm. Yet the wound itself looks actually quite good. Dermagraft No. 7 7/7; tunneling at 4.5. Wound dimensions are down from last week especially in width. Dermagraft No. 8 7/14; the tunneling is down in the wound has less with. What I can see the base of the wound looks healthy. We will change him to endoform AG. He continues along with HBO we are making progress albeit slow 7/21; tunneling in the most proximal part of this wound is at 1 cm. The rest of this is down to a linear slitlike area. Not much with. No evidence of infection 7/28; the tunneling and the most proximal part of this wound is down to 0.3 cm measured by myself otherwise it remains a linear slitlike area. The base of this appears to be healthy. No evidence of infection. He is seen today along with hyperbarics 8/11-Left medial foot wound has covering of eschar-like tissue but it is otherwise small opening. 8/18; left medial foot using endoform. Small opening what I can see of the base of this looks healthy. Most impressively the tunneling area towards the medial part of his foot is close down there is some undermining from roughly  1:00 to 4:00 of roughly 2 mm. 8/25; continued improvement using endoform. There is no tunneling or undermining that I could identify. Electronic Signature(s) Signed: 07/21/2019 6:47:38 PM By: Baltazar Najjar MD Entered By: Baltazar Najjar on 07/21/2019 14:39:35 -------------------------------------------------------------------------------- Physical Exam Details Patient Name: Date of Service: Andrew Schultz, Andrew Schultz 07/21/2019 1:30 PM Medical Record [id number] Patient Account Number: [id number] Date of Birth/Sex: Treating RN: 01-Apr-1946 (73 y.o. Andrew Petit) Yevonne Pax Primary Care Provider: Nita Sells Other Clinician: Referring Provider: Treating Provider/Extender:Kariel Skillman, [name], [name] in Treatment: 30 Constitutional Sitting or standing Blood Pressure is within target range for patient.. Pulse regular and within target range for patient.Marland Kitchen Respirations regular, non-labored and within target range.. Temperature is normal and within the target range for the patient.Marland Kitchen Appears in no distress. Notes Wound exam; left medial foot. There is no undermining and no tunneling identified. Using a #3 curette I knocked down skin and subcutaneous tissue from around the wound circumference to hopefully give [place] an edge for epithelialization. Otherwise the wound base looks extremely healthy Electronic Signature(s) Signed: 07/21/2019 6:47:38 PM By: Baltazar Najjar MD Entered By: Baltazar Najjar on 07/21/2019 14:40:31 -------------------------------------------------------------------------------- Physician Orders Details Patient Name: Date of Service: Andrew Schultz, Andrew Schultz 07/21/2019 1:30 PM Medical Record [id number] Patient Account Number: [id number] Date of Birth/Sex: Treating RN: [birth year] (73 y.o. Andrew Petit) Yevonne Pax Primary Care Provider: Nita Sells Other Clinician: Referring Provider: Treating Provider/Extender:Zeah Germano, [name], [name] in Treatment: 30 Verbal / Phone Orders: No Diagnosis  Coding ICD-10 Coding Code Description [phone] Subacute osteomyelitis, left ankle and foot L97.524 Non-pressure chronic ulcer of other part of left foot with necrosis of bone E10.621 Type 1 diabetes mellitus with foot ulcer E10.40 Type 1 diabetes mellitus with diabetic neuropathy, unspecified M14.672 Charcot's joint, left ankle and foot Z89.511 Acquired absence of right leg below knee Follow-up Appointments Return Appointment in 1 week. Dressing Change Frequency Change Dressing every other day. Skin Barriers/Peri-Wound Care Barrier cream Wound Cleansing Wound #2 Left,Medial Foot May shower and wash wound with soap and water. Primary Wound Dressing Other: - endoform ag Secondary Dressing Wound #2 Left,Medial Foot Kerlix/Rolled Gauze ABD pad Other: - secure with tape Electronic Signature(s) Signed: 07/21/2019 6:47:38 PM By: Baltazar Najjar MD Signed: 11/03/2019 3:07:45 PM By: Yevonne Pax RN Entered By: Yevonne Pax on 07/21/2019 14:37:33 -------------------------------------------------------------------------------- Problem List Details  Patient Name: Date of Service: Andrew Schultz, Andrew Schultz 07/21/2019 1:30 PM Medical Record [id number] Patient Account Number: [id number] Date of Birth/Sex: Treating RN: 23-Jul-1946 (73 y.o. Andrew Petit) Yevonne Pax Primary Care Provider: Nita Sells Other Clinician: Referring Provider: Treating Provider/Extender:Charlii Yost, [name], [name] in Treatment: 30 Active Problems ICD-10 Evaluated Encounter Code Description Active Date Today Diagnosis [phone] Subacute osteomyelitis, left ankle and foot 02/17/2019 No Yes L97.524 Non-pressure chronic ulcer of other part of left foot 02/17/2019 No Yes with necrosis of bone E10.621 Type 1 diabetes mellitus with foot ulcer 12/22/2018 No Yes E10.40 Type 1 diabetes mellitus with diabetic neuropathy, 12/22/2018 No Yes unspecified N56.213 Charcot's joint, left ankle and foot 12/22/2018 No Yes Z89.511 Acquired absence of  right leg below knee 12/22/2018 No Yes Inactive Problems Resolved Problems Electronic Signature(s) Signed: 07/21/2019 6:47:38 PM By: Baltazar Najjar MD Entered By: Baltazar Najjar on 07/21/2019 14:38:49 -------------------------------------------------------------------------------- Progress Note Details Patient Name: Date of Service: Andrew Schultz, Andrew Schultz 07/21/2019 1:30 PM Medical Record [id number] Patient Account Number: [id number] Date of Birth/Sex: Treating RN: July 06, 1946 (73 y.o. Andrew Schultz Primary Care Provider: Nita Sells Other Clinician: Referring Provider: Treating Provider/Extender:Malky Rudzinski, [name], [name] in Treatment: 30 Subjective History of Present Illness (HPI) 12/10/16; this is a 73 year old man who is a type I diabetic with an insulin pump. He is a retired Scientist, clinical (histocompatibility and immunogenetics). 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 He also has a history of diabetic angiopathy although this was previously worked up in [place]. He was told he had small vessel disease they could not be further revascularized. 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 He tells me he was cared for for a period of time in [place] had a wound care center and had hyperbaric oxygen. The patient also has rheumatoid arthritis and is on methotrexate. The patient states he has been dealing with a wound on the right medial ankle since October. He has been applying Silvadene based dressings to this. He was seen in the ER on 10/31/16 at that point felt to have cellulitis and was given prescriptions for Augmentin and Bactrim. He was apparently given a podiatry follow-up. 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 There  was diffuse soft tissue swelling about the ankle. He also had a duplex ultrasound of the right leg that was negative for DVT ABIs in this clinic were noncompressible 12/28/16; most of the information the patient brought back from [place] was from 2013 at that point he had skin ulcers and suspicion of osteomyelitis. MRI of the right foot showed a Charcot foot he had osteomyelitis in the fourth and fifth metatarsals. He has had removal of the fifth metatarsal on the left foot on 6/24 2011 amputation of the right fourth and one half of the great toe amputation of the left second toe left fifth toe. He saw vascular surgery in 2011. At that point the had non-invasive studies showing tibial artery disease Doppler waveforms were within normal limits. There was no evidence of arterial insufficiency of the left leg the right great toe pressure was unable to be obtained secondary to an amputation of the left great toe pressure was above the healing index. It was not felt that he would be a candidate for intervention at that point he was felt  to have microvascular blood supply issues. It is very clear he will need follow-up vascular studies and probably venous reflux studies is aware of the right leg. We also [facility] [place] [facility] the right foot showed severe destruction of the ankle joint secondary to Charcot form deformity. There are Charcot changes visible to the ankle and midfoot joints. He was referred here. He has been using Santyl in the interim while he was traveling in [place] 01/04/17; not much change here. We have been using Santyl. He has arterial and venous studies next week asks that we can see him in 2 weeks which is reasonable. He had an ultrasound of his right leg rule out DVT in early December and they comment on no venous reflux however this was not a true reflux study. He clearly has venous insufficiency 01/18/17; the patient has  undergone his ordered studies. His VENOUS REFLUX studies show no evidence of a DVT or superficial vein thrombophlebitis. There is evidence of great saphenous vein reflux in the right lower extremity as well as reflux in the common femoral and popliteal vein. No reflux was noted in the small saphenous vein on the right. ARTERIAL studies showed an ABI on the right of 1.54, waveforms were biphasic at the posterior tibial and dorsalis pedis on the right. He could not have a TBi on the right secondary to prior amputation. There was a suggestion of falsely elevated left TBI due to calcified vessel with a left TBI of 0.95. The patient will definitely need to see vascular surgery with regards to his arterial and venous systems. 02/01/17; appointment with vascular surgery on April 4. Still using Santyl. 02/15/17; the patient was changed to Prisma 2 weeks ago at the time the wound was small but with some depth but a healthy base. Apparently everything was going well to 2 or 3 days ago when the patient and his wife noted deterioration wound. He came in today on his usual appointment. He has vascular surgery appointment on April 4. Considerable deterioration today necrotic material,. Drainage and exposed bone. Mild degree of erythema around the wound which was marked. Change the primary dressing to silver alginate. Augmentin and doxycycline until culture comes back. This was done dark drainage. MRI of the foot is been ordered. He tells me that he has previously had osteomyelitis but I don't believe in this area 02/21/17; I saw this patient a week ago with a marked deterioration in the wound over his right medial malleolus. Culture at the time showed methicillin sensitive staph aureus. I've emperically put him on doxycycline on Augmentin last week. In the meantime he noted increasing swelling and drainage and was admitted to hospital from 3/25 through 3/27. He was given vancomycin however he was discharged on  doxycycline for 10 days. He is still using silver alginate to the wound bed. He comes back today with a necrotic wound surface easily palpable friable necrotic bone underneath this. He has not been systemically unwell. He tells me he is trying his best offload this area and walking minimally. He has not been systemically unwell. 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 There is chronic partial destruction of the distal tibia and fibula, calcaneus and navicular. The navicular is posteriorly displaced. The talus is not definitely seen either destroyed her previously resected. With regards the ulcer this was noted. The wound abuts on the medial malleolus there is thick periosteal thickening  but no gross cortical destruction there is a complex fluid collection at this aortic relation between the distal tibia and the calcaneus the feeling was that this was not osteomyelitis given the extensive Charcot changes but osteomyelitis could not definitely be excluded 03/01/17- patient is here for follow-up evaluation of his right medial foot/ankle ulceration. Did have tissue culture and bone pathology sent last week. Bone pathology reveals chronic osteomyelitis, no malignancy. The tissue culture resulted oxacillin sensitive staph aureus. He is currently on Keflex. 03/04/17; the patient's pathology showed chronic osteomyelitis. Bone culture revealed methicillin sensitive staph aureus he is on Keflex. He arrives today in clinic with a piece of bone protruding out of the wound 03/11/17; with considerable difficulty and help of our staff 5 min able to get the patient appointment on Thursday with infectious disease and subtotally next Monday morning with Dr. Bennett Scrape podiatric ankle surgeon with [facility] with regards to the Charcot right ankle joint and underlying infection. He has been using silver alginate and remains on Keflex 03/18/17; the patient saw Dr.  Bennett Scrape who outlined to surgical options 1 a below-knee amputation and the other a debridement of the underlying fibula possibly part of the tibia with an external fixator. Pending the patient is leaning towards the amputation as even if this could possibly result in a functional limb he would be at risk for further Charcot complications in the future. We are using silver alginate to the wound and continuing Keflex. If he opts for an aggressive posture to attempt to save his leg then he'll probably need IV antibiotics and could be considered for hyperbaric oxygen however I doubt that's what he'll decide given his tone today READMISSION 12/22/2018 This is a patient with type 1 diabetes, severe peripheral neuropathy and severe Charcot foot and ankle deformities. We had him here in 2018 actually looking an area on the right foot. This did not heal in fact became worse and secondarily infected. He saw Dr. Bennett Scrape of podiatric surgery at [facility]. 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 He has a prosthesis and was doing well. He tells me that late in October he developed a small blister on the medial aspect of his left first metatarsal head. He was followed by podiatry initially with Iodosorb and then 2 weeks ago with Santyl to the wound area. His wife changes the dressings. 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 Since he was last here he had a left third toe amputation which healed well. ABIs in this clinic on the left were noncompressible 2/10; the patient's area on the medial aspect of the left great toe. Since the last time the  patient was here 2 weeks ago this is actually deteriorated now with exposed tendon. He has been using Santyl. The patient had arterial studies done in February 2018. On the left this showed noncompressible vessels bilaterally but with a TBI of 0.95. Waveforms were biphasic at the posterior tibial and dorsalis pedis. 2/17; x-ray from last week showed the third toe amputation. Medial soft tissue ulceration without evidence of osteomyelitis. We have been using Santyl. His arterial studies to compare with the ones done 2 years ago are tomorrow.  Run Grafix PL through his insurance 2/25. Arterial studies were done on 2/18. On the left this showed noncompressible vessels but with triphasic wave waveforms at the PTA and biphasic at the dorsalis pedis. TBI was 0.55 down from 0.95 2 years ago. His wound is on the medial left first metatarsal head. This does not have a viable surface dry and exposed tendon. 3/10/; patient did not have his vascular consult done because of an error in sending the orders. This will be done today. He arrives today with some erythema around the wound nothing but exposed tendon. I have been keeping this moist with hydrogel and silver collagen. He will need an MRI a vascular surgery consult. 3/24; he still not has not seen vascular surgery however he was admitted to hospital from 3/12 through 02/07/2019. He was felt to have acute osteomyelitis of the left foot. He was seen by his podiatrist at Dr. Allena Katz. There were no cultures done. He was initially treated with vancomycin cefepime and Flagyl however discharged on vancomycin and Augmentin. He has MRI documented septic arthritis and osteomyelitis of the first metatarsal head and neck. The patient arrived in clinic with a sheet of concerns predominantly that advanced home care did not have orders beyond Thursday morning. I E as antibiotics would stop. 3/31; he is now on vancomycin and ertapenem as directed by infectious disease. We  have everybody on board. He is going for his angiogram tomorrow although I have not yet reviewed vein and vascular's notes. We have been using Santyl on the wound bed 4/7; he apparently had some renal insufficiency issues and his vancomycin was temporarily on hold. He did have his angiogram done on 4/1; this did not show any significant stenosis. There was three-vessel runoff. The dominant vessel was the anterior tibial. There was some evidence of digital arterial atherosclerotic disease but certainly nothing that required an intervention from a macrovascular point of view. In preparation for possible HBO I am going to order a hemoglobin A1c. Also notable that he has a low albumin level. He has had a reduction in his appetite which he is attributing to antibiotic therapy. 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 I will need to research what we know about this on Michie link i.e. has he ever had urine studies 4/14; his vancomycin is still on hold as far as the patient is aware. I find this somewhat unusual unless there is more to this than I realized. We have been using Santyl to the wound 4/21; patient seen in conjunction with HBO. I did get some lab work from home health. On 4/31 his vancomycin level was in the 90s. Essentially it is been on hold since then. Lab work that was sent to me today dated 4/20 shows a vancomycin level of 17.8. Fortunately his BUN and creatinine are both normal at 20 and 1.0. His white count is 5.6 hemoglobin 11.5. I also have from 4/17 a sedimentation rate of 96 and a C-reactive protein of 20 although the patient has rheumatoid arthritis. He also tells me that his methotrexate and biologic agent have been put on hold by his rheumatologist because of the underlying osteomyelitis. Finally I am not exactly sure who is following his antibiotics after discussion with the patient today. I did involve Dr. Orvan Falconer via secure text I think when it became  clear that his antibiotics are running out after his hospitalization. I also discussed things with his primary doctor Dr. Dwana Melena in [place]. But  I am not exactly sure who is following the labs. He has been tolerating HBO well. 4/28; the patient is tolerating HBO well. There is been improvements in his wound dimension especially the undermining at 1:00 towards the medial foot. Today measured at 1.2 cm. We have been in contact with home health. The antibiotics finished on Sunday. He still has his PICC line in place. C-reactive protein and sedimentation rate were measured yesterday. His methotrexate and biologic agent are still on hold he has not had any trouble with increasing pain from his RA 5/5; the patient is tolerating HBO well. He is seen today in conjunction with that his wound is improved had been improving although it is not much different from last week. Undermining towards the medial foot today measured at 1.8 up from 1.2 last week. Went to see Dr. Luciana Axe of ophthalmology. Fortunately did not have anything major wrong. Most of the symptoms he was having in his eyes were related to pseudophakia. He feels better today and he tolerated his treatment well. We are using endoform. Previously his insurance which is through [place] [employer] had refused them for Grafix. I am going to run Dermagraft I also looked over his inflammatory markers his sed rate is still over 100. I do not know how to interpret this in this patient who has rheumatoid arthritis. I am not sure that the persistence of an elevated sed rate in this man means of treatment failure 5/12; the patient was seen today for wound care evaluation in conjunction with hyperbarics that he is tolerating well. We have been using endoform. The undermining area to along the medial aspect of his foot and come down nicely however on our evaluation today that is gone back up to 4 cm which is really very disappointing. Other than that  the wound bed paradoxically looks somewhat better 5/19- Patient seen after HBO, the ulcer now undermines to 3.5 cm which is better than 4.0 last time 5/26; patient was seen after HBO. He apparently had a single Dermagraft applied last week. I applied Dermagraft No. 2. Careful attention to the tunneling along the medial foot which currently measures 3.5 cm about the same as last time 6/2; patient was seen after HBO. Dermagraft No. 3 applied. Tunneling area improved condition of the wound bed and improved. 6/9; patient was seen after HBO. The tunneling area is about 1 cm about the same as last time. Dermagraft No. 4 applied. We are also going to recertify him for hyperbarics which is really seems to have helped the wound healing process. 6/16; patient was seen after HBO. The tunneling area has deepened this week towards the medial part of his foot but that had come in last week. Nevertheless the wound bed looks healthy. Dermagraft No. 5 applied after debridement 6/23. Patient was seen after HBO tunneling area is down from 4 to 3.6 cm. I think we have had some contraction of the surface area. There is no palpable bone. No evidence of infection. Dermagraft No. 6 applied 6/30; the tunneling expanded once again to 7 cm. Yet the wound itself looks actually quite good. Dermagraft No. 7 7/7; tunneling at 4.5. Wound dimensions are down from last week especially in width. Dermagraft No. 8 7/14; the tunneling is down in the wound has less with. What I can see the base of the wound looks healthy. We will change him to endoform AG. He continues along with HBO we are making progress albeit slow 7/21; tunneling in the most proximal  part of this wound is at 1 cm. The rest of this is down to a linear slitlike area. Not much with. No evidence of infection 7/28; the tunneling and the most proximal part of this wound is down to 0.3 cm measured by myself otherwise it remains a linear slitlike area. The base of this  appears to be healthy. No evidence of infection. He is seen today along with hyperbarics 8/11-Left medial foot wound has covering of eschar-like tissue but it is otherwise small opening. 8/18; left medial foot using endoform. Small opening what I can see of the base of this looks healthy. Most impressively the tunneling area towards the medial part of his foot is close down there is some undermining from roughly 1:00 to 4:00 of roughly 2 mm. 8/25; continued improvement using endoform. There is no tunneling or undermining that I could identify. Objective Constitutional Sitting or standing Blood Pressure is within target range for patient.. Pulse regular and within target range for patient.Marland Kitchen Respirations regular, non-labored and within target range.. Temperature is normal and within the target range for the patient.Marland Kitchen Appears in no distress. Vitals Time Taken: 2:12 PM, Height: 67 in, Source: Stated, Weight: 208 lbs, Source: Stated, BMI: 32.6, Temperature: 98.2 F, Pulse: 80 bpm, Respiratory Rate: 18 breaths/min, Blood Pressure: 119/65 mmHg, Capillary Blood Glucose: 85 mg/dl. General Notes: glucose per pt report this am General Notes: Wound exam; left medial foot. There is no undermining and no tunneling identified. Using a #3 curette I knocked down skin and subcutaneous tissue from around the wound circumference to hopefully give [place] an edge for epithelialization. Otherwise the wound base looks extremely healthy Integumentary (Hair, Skin) Wound #2 status is Open. Original cause of wound was Gradually Appeared. The wound is located on the Left,Medial Foot. The wound measures 1.3cm length x 0.3cm width x 0.6cm depth; 0.306cm^2 area and 0.184cm^3 volume. There is Fat Layer (Subcutaneous Tissue) Exposed exposed. There is no tunneling or undermining noted. There is a small amount of serosanguineous drainage noted. The wound margin is flat and intact. There is large (67- 100%) red granulation within  the wound bed. There is no necrotic tissue within the wound bed. Assessment Active Problems ICD-10 Subacute osteomyelitis, left ankle and foot Non-pressure chronic ulcer of other part of left foot with necrosis of bone Type 1 diabetes mellitus with foot ulcer Type 1 diabetes mellitus with diabetic neuropathy, unspecified Charcot's joint, left ankle and foot Acquired absence of right leg below knee Procedures Wound #2 Pre-procedure diagnosis of Wound #2 is a Diabetic Wound/Ulcer of the Lower Extremity located on the Left,Medial Foot .Severity of Tissue Pre Debridement is: Fat layer exposed. There was a Excisional Skin/Subcutaneous Tissue Debridement with a total area of 0.39 sq cm performed by Ricard Dillon., MD. With the following instrument(s): Curette to remove Viable and Non-Viable tissue/material. Material removed includes Callus and Subcutaneous Tissue and after achieving pain control using Lidocaine 5% topical ointment. No specimens were taken. A time out was conducted at 14:30, prior to the start of the procedure. A Minimum amount of bleeding was controlled with Pressure. The procedure was tolerated well with a pain level of 0 throughout and a pain level of 0 following the procedure. Post Debridement Measurements: 1.3cm length x 0.3cm width x 0.6cm depth; 0.184cm^3 volume. Character of Wound/Ulcer Post Debridement is improved. Severity of Tissue Post Debridement is: Fat layer exposed. Post procedure Diagnosis Wound #2: Same as Pre-Procedure Plan Follow-up Appointments: Return Appointment in 1 week. Dressing Change Frequency: Change  Dressing every other day. Skin Barriers/Peri-Wound Care: Barrier cream Wound Cleansing: Wound #2 Left,Medial Foot: May shower and wash wound with soap and water. Primary Wound Dressing: Other: - endoform ag Secondary Dressing: Wound #2 Left,Medial Foot: Kerlix/Rolled Gauze ABD pad Other: - secure with tape 1. Continue with endoform AG.  Appears to be making nice progress 2. Debridement today to hopefully give [place] an edge for epithelialization. 3. As noted there is absolutely no tunneling or undermining Electronic Signature(s) Signed: 07/21/2019 6:47:38 PM By: Baltazar Najjar MD Entered By: Baltazar Najjar on 07/21/2019 14:41:15 -------------------------------------------------------------------------------- SuperBill Details Patient Name: Date of Service: Andrew Schultz, Andrew Schultz 07/21/2019 Medical Record [id number] Patient Account Number: [id number] Date of Birth/Sex: Treating RN: September 12, 1946 (73 y.o. Andrew Petit) Yevonne Pax Primary Care Provider: Nita Sells Other Clinician: Referring Provider: Treating Provider/Extender:Breeley Bischof, [name], [name] in Treatment: 30 Diagnosis Coding ICD-10 Codes Code Description [phone] Subacute osteomyelitis, left ankle and foot L97.524 Non-pressure chronic ulcer of other part of left foot with necrosis of bone E10.621 Type 1 diabetes mellitus with foot ulcer E10.40 Type 1 diabetes mellitus with diabetic neuropathy, unspecified M14.672 Charcot's joint, left ankle and foot Z89.511 Acquired absence of right leg below knee Facility Procedures CPT4 Code Description: [id number] 11042 - DEB SUBQ TISSUE 20 SQ CM/< ICD-10 Diagnosis Description L97.524 Non-pressure chronic ulcer of other part of left foot with n Modifier: ecrosis of Quantity: 1 bone Physician Procedures CPT4 Code Description: [id number] 11042 - WC PHYS SUBQ TISS 20 SQ CM ICD-10 Diagnosis Description L97.524 Non-pressure chronic ulcer of other part of left foot with n Modifier: ecrosis of Quantity: 1 bone Electronic Signature(s) Signed: 07/21/2019 6:47:38 PM By: Baltazar Najjar MD Entered By: Baltazar Najjar on 07/21/2019 14:46:52

## 2019-11-03 NOTE — Progress Notes (Signed)
VALENTIN, BENNEY ([id number]) Visit Report for 07/14/2019 HPI Details Patient Name: Date of Service: Nuel, Andrew Schultz 07/14/2019 1:30 PM Medical Record [id number] Patient Account Number: [id number] Date of Birth/Sex: Treating RN: [birth year] (73 y.o. Andrew Schultz) Yevonne Pax Primary Care Provider: Nita Sells Other Clinician: Referring Provider: Treating Provider/Extender:[name], [name], [name] in Treatment: 29 History of Present Illness HPI Description: 12/10/16; this is a 73 year old man who is a type I diabetic with an insulin pump. He is a retired Scientist, clinical (histocompatibility and immunogenetics). 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 He also has a history of diabetic angiopathy although this was previously worked up in [place]. He was told he had small vessel disease they could not be further revascularized. 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 He tells me he was cared for for a period of time in [place] had a wound care center and had hyperbaric oxygen. The patient also has rheumatoid arthritis and is on methotrexate. The patient states he has been dealing with a wound on the right medial ankle since October. He has been applying Silvadene based dressings to this. He was seen in the ER on 10/31/16 at that point felt to have cellulitis and was given prescriptions for Augmentin and Bactrim. He was apparently given a podiatry follow-up. 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 There was diffuse soft tissue swelling about the ankle. He also had a duplex ultrasound of the right leg that was negative for DVT ABIs in this clinic were noncompressible 12/28/16; most of the information the patient brought back from [place] was  from 2013 at that point he had skin ulcers and suspicion of osteomyelitis. MRI of the right foot showed a Charcot foot he had osteomyelitis in the fourth and fifth metatarsals. He has had removal of the fifth metatarsal on the left foot on 6/24 2011 amputation of the right fourth and one half of the great toe amputation of the left second toe left fifth toe. He saw vascular surgery in 2011. At that point the had non-invasive studies showing tibial artery disease Doppler waveforms were within normal limits. There was no evidence of arterial insufficiency of the left leg the right great toe pressure was unable to be obtained secondary to an amputation of the left great toe pressure was above the healing index. It was not felt that he would be a candidate for intervention at that point he was felt to have microvascular blood supply issues. It is very clear he will need follow-up vascular studies and probably venous reflux studies is aware of the right leg. We also have information from his podiatric surgeons in [facility] x-ray of the right foot showed severe destruction of the ankle joint secondary to Charcot form deformity. There are Charcot changes visible to the ankle and midfoot joints. He was referred here. He has been using Santyl in the interim while he was traveling in [place] 01/04/17; not much change here. We have been using Santyl. He has arterial and venous studies next week asks that we can see him in 2 weeks which is reasonable. He had an ultrasound of his right leg rule out DVT in early December and they comment on no venous reflux however this was not a true reflux study.  He clearly has venous insufficiency 01/18/17; the patient has undergone his ordered studies. His VENOUS REFLUX studies show no evidence of a DVT or superficial vein thrombophlebitis. There is evidence of great saphenous vein reflux in the right lower extremity as well as reflux in the common femoral and  popliteal vein. No reflux was noted in the small saphenous vein on the right. ARTERIAL studies showed an ABI on the right of 1.54, waveforms were biphasic at the posterior tibial and dorsalis pedis on the right. He could not have a TBi on the right secondary to prior amputation. There was a suggestion of falsely elevated left TBI due to calcified vessel with a left TBI of 0.95. The patient will definitely need to see vascular surgery with regards to his arterial and venous systems. 02/01/17; appointment with vascular surgery on April 4. Still using Santyl. 02/15/17; the patient was changed to Prisma 2 weeks ago at the time the wound was small but with some depth but a healthy base. Apparently everything was going well to 2 or 3 days ago when the patient and his wife noted deterioration wound. He came in today on his usual appointment. He has vascular surgery appointment on April 4. Considerable deterioration today necrotic material,. Drainage and exposed bone. Mild degree of erythema around the wound which was marked. Change the primary dressing to silver alginate. Augmentin and doxycycline until culture comes back. This was done dark drainage. MRI of the foot is been ordered. He tells me that he has previously had osteomyelitis but I don't believe in this area 02/21/17; I saw this patient a week ago with a marked deterioration in the wound over his right medial malleolus. Culture at the time showed methicillin sensitive staph aureus. I've emperically put him on doxycycline on Augmentin last week. In the meantime he noted increasing swelling and drainage and was admitted to hospital from 3/25 through 3/27. He was given vancomycin however he was discharged on doxycycline for 10 days. He is still using silver alginate to the wound bed. He comes back today with a necrotic wound surface easily palpable friable necrotic bone underneath this. He has not been systemically unwell. He tells me he is trying his  best offload this area and walking minimally. He has not been systemically unwell. 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 There is chronic partial destruction of the distal tibia and fibula, calcaneus and navicular. The navicular is posteriorly displaced. The talus is not definitely seen either destroyed her previously resected. With regards the ulcer this was noted. The wound abuts on the medial malleolus there is thick periosteal thickening but no gross cortical destruction there is a complex fluid collection at this aortic relation between the distal tibia and the calcaneus the feeling was that this was not osteomyelitis given the extensive Charcot changes but osteomyelitis could not definitely be excluded 03/01/17- patient is here for follow-up evaluation of his right medial foot/ankle ulceration. Did have tissue culture and bone pathology sent last week. Bone pathology reveals chronic osteomyelitis, no malignancy. The tissue culture resulted oxacillin sensitive staph aureus. He is currently on Keflex. 03/04/17; the patient's pathology showed chronic osteomyelitis. Bone culture revealed methicillin sensitive staph aureus he is on Keflex. He arrives today in clinic with a piece of bone protruding out of the wound 03/11/17; with considerable difficulty and help of our staff 5 min able to get the patient appointment on Thursday with infectious disease and subtotally next Monday  morning with Dr. Bennett Scrape podiatric ankle surgeon with [facility] with regards to the Charcot right ankle joint and underlying infection. He has been using silver alginate and remains on Keflex 03/18/17; the patient saw Dr. Bennett Scrape who outlined to surgical options 1 a below-knee amputation and the other a debridement of the underlying fibula possibly part of the tibia with an external fixator. Pending the patient is leaning towards the amputation as even if this could  possibly result in a functional limb he would be at risk for further Charcot complications in the future. We are using silver alginate to the wound and continuing Keflex. If he opts for an aggressive posture to attempt to save his leg then he'll probably need IV antibiotics and could be considered for hyperbaric oxygen however I doubt that's what he'll decide given his tone today READMISSION 12/22/2018 This is a patient with type 1 diabetes, severe peripheral neuropathy and severe Charcot foot and ankle deformities. We had him here in 2018 actually looking an area on the right foot. This did not heal in fact became worse and secondarily infected. He saw Dr. Bennett Scrape of podiatric surgery at [facility] 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 He has a prosthesis and was doing well. He tells me that late in October he developed a small blister on the medial aspect of his left first metatarsal head. He was followed by podiatry initially with Iodosorb and then 2 weeks ago with Santyl to the wound area. His wife changes the dressings. 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 Since he was last here he had a left third toe amputation which healed well. ABIs in this clinic on the left were noncompressible 2/10; the patient's area on the medial aspect of the left great toe. Since the last time the patient was here 2 weeks ago this is actually deteriorated now with exposed tendon. He has been using Santyl. The patient had arterial studies done in February 2018. On the left this showed noncompressible vessels bilaterally but with a TBI of 0.95.  Waveforms were biphasic at the posterior tibial and dorsalis pedis. 2/17; x-ray from last week showed the third toe amputation. Medial soft tissue ulceration without evidence of osteomyelitis. We have been using Santyl. His arterial studies to compare with the ones done 2 years ago are tomorrow. Run Grafix PL through his insurance 2/25. Arterial studies were done on 2/18. On the left this showed noncompressible vessels but with triphasic wave waveforms at the PTA and biphasic at the dorsalis pedis. TBI was 0.55 down from 0.95 2 years ago. His wound is on the medial left first metatarsal head. This does not have a viable surface dry and exposed tendon. 3/10/; patient did not have his vascular consult done because of an error in sending the orders. This will be done today. He arrives today with some erythema around the wound nothing but exposed tendon. I have been keeping this moist with hydrogel and silver collagen. He will need an MRI a vascular surgery consult. 3/24; he still not has not seen vascular surgery however he was admitted to hospital from 3/12 through 02/07/2019. He was felt to have acute  osteomyelitis of the left foot. He was seen by his podiatrist at Dr. Allena Katz. There were no cultures done. He was initially treated with vancomycin cefepime and Flagyl however discharged on vancomycin and Augmentin. He has MRI documented septic arthritis and osteomyelitis of the first metatarsal head and neck. The patient arrived in clinic with a sheet of concerns predominantly that advanced home care did not have orders beyond Thursday morning. I E as antibiotics would stop. 3/31; he is now on vancomycin and ertapenem as directed by infectious disease. We have everybody on board. He is going for his angiogram tomorrow although I have not yet reviewed vein and vascular's notes. We have been using Santyl on the wound bed 4/7; he apparently had some renal insufficiency issues and his vancomycin was  temporarily on hold. He did have his angiogram done on 4/1; this did not show any significant stenosis. There was three-vessel runoff. The dominant vessel was the anterior tibial. There was some evidence of digital arterial atherosclerotic disease but certainly nothing that required an intervention from a macrovascular point of view. In preparation for possible HBO I am going to order a hemoglobin A1c. Also notable that he has a low albumin level. He has had a reduction in his appetite which he is attributing to antibiotic therapy. 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 I will need to research what we know about this on [facility] i.e. has he ever had urine studies 4/14; his vancomycin is still on hold as far as the patient is aware. I find this somewhat unusual unless there is more to this than I realized. We have been using Santyl to the wound 4/21; patient seen in conjunction with HBO. I did get some lab work from home health. On 4/31 his vancomycin level was in the 90s. Essentially it is been on hold since then. Lab work that was sent to me today dated 4/20 shows a vancomycin level of 17.8. Fortunately his BUN and creatinine are both normal at 20 and 1.0. His white count is 5.6 hemoglobin 11.5. I also have from 4/17 a sedimentation rate of 96 and a C-reactive protein of 20 although the patient has rheumatoid arthritis. He also tells me that his methotrexate and biologic agent have been put on hold by his rheumatologist because of the underlying osteomyelitis. Finally I am not exactly sure who is following his antibiotics after discussion with the patient today. I did involve Dr. Orvan Falconer via secure text I think when it became clear that his antibiotics are running out after his hospitalization. I also discussed things with his primary doctor Dr. Dwana Melena in [place]. But I am not exactly sure who is following the labs. He has been tolerating HBO well. 4/28; the  patient is tolerating HBO well. There is been improvements in his wound dimension especially the undermining at 1:00 towards the medial foot. Today measured at 1.2 cm. We have been in contact with home health. The antibiotics finished on Sunday. He still has his PICC line in place. C-reactive protein and sedimentation rate were measured yesterday. His methotrexate and biologic agent are still on hold he has not had any trouble with increasing pain from his RA 5/5; the patient is tolerating HBO well. He is seen today in conjunction with that his wound is improved had been improving although it is not much different from last week. Undermining towards the medial foot today measured at 1.8 up from 1.2 last week. Went to see Dr.  Rankin of ophthalmology. Fortunately did not have anything major wrong. Most of the symptoms he was having in his eyes were related to pseudophakia. He feels better today and he tolerated his treatment well. We are using endoform. Previously his insurance which is through [employer] had refused them for Grafix. I am going to run Dermagraft I also looked over his inflammatory markers his sed rate is still over 100. I do not know how to interpret this in this patient who has rheumatoid arthritis. I am not sure that the persistence of an elevated sed rate in this man means of treatment failure 5/12; the patient was seen today for wound care evaluation in conjunction with hyperbarics that he is tolerating well. We have been using endoform. The undermining area to along the medial aspect of his foot and come down nicely however on our evaluation today that is gone back up to 4 cm which is really very disappointing. Other than that the wound bed paradoxically looks somewhat better 5/19- Patient seen after HBO, the ulcer now undermines to 3.5 cm which is better than 4.0 last time 5/26; patient was seen after HBO. He apparently had a single Dermagraft applied last week. I  applied Dermagraft No. 2. Careful attention to the tunneling along the medial foot which currently measures 3.5 cm about the same as last time 6/2; patient was seen after HBO. Dermagraft No. 3 applied. Tunneling area improved condition of the wound bed and improved. 6/9; patient was seen after HBO. The tunneling area is about 1 cm about the same as last time. Dermagraft No. 4 applied. We are also going to recertify him for hyperbarics which is really seems to have helped the wound healing process. 6/16; patient was seen after HBO. The tunneling area has deepened this week towards the medial part of his foot but that had come in last week. Nevertheless the wound bed looks healthy. Dermagraft No. 5 applied after debridement 6/23. Patient was seen after HBO tunneling area is down from 4 to 3.6 cm. I think we have had some contraction of the surface area. There is no palpable bone. No evidence of infection. Dermagraft No. 6 applied 6/30; the tunneling expanded once again to 7 cm. Yet the wound itself looks actually quite good. Dermagraft No. 7 7/7; tunneling at 4.5. Wound dimensions are down from last week especially in width. Dermagraft No. 8 7/14; the tunneling is down in the wound has less with. What I can see the base of the wound looks healthy. We will change him to endoform AG. He continues along with HBO we are making progress albeit slow 7/21; tunneling in the most proximal part of this wound is at 1 cm. The rest of this is down to a linear slitlike area. Not much with. No evidence of infection 7/28; the tunneling and the most proximal part of this wound is down to 0.3 cm measured by myself otherwise it remains a linear slitlike area. The base of this appears to be healthy. No evidence of infection. He is seen today along with hyperbarics 8/11-Left medial foot wound has covering of eschar-like tissue but it is otherwise small opening. 8/18; left medial foot using endoform. Small opening  what I can see of the base of this looks healthy. Most impressively the tunneling area towards the medial part of his foot is close down there is some undermining from roughly 1:00 to 4:00 of roughly 2 mm. Electronic Signature(s) Signed: 07/14/2019 5:30:55 PM By: Baltazar Najjarobson, Michael MD  Entered By: Baltazar Najjar on 07/14/2019 15:01:03 -------------------------------------------------------------------------------- Physical Exam Details Patient Name: Date of Service: [place], Schorr 07/14/2019 1:30 PM Medical Record [id number] Patient Account Number: [id number] Date of Birth/Sex: Treating RN: [birth year] (73 y.o. Andrew Schultz) Yevonne Pax Primary Care Provider: Nita Sells Other Clinician: Referring Provider: Treating Provider/Extender:[name], [name], [name] in Treatment: 29 Constitutional Sitting or standing Blood Pressure is within target range for patient.. Pulse regular and within target range for patient.Marland Kitchen Respirations regular, non-labored and within target range.. Temperature is normal and within the target range for the patient.Marland Kitchen Appears in no distress. Eyes Conjunctivae clear. No discharge.no icterus. Respiratory work of breathing is normal. Cardiovascular Pedal pulses are palpable. Integumentary (Hair, Skin) No erythema around the wound. Psychiatric appears at normal baseline. Notes Wound exam; left medial foot wound was debrided last week. I did not feel any debridement was necessary. What I can see of the base of this wound looks like reasonably healthy tissue. Scant amount of undermining from 1-4 o'clock. The tunnel at one point that went down to his left medial foot has closed down completely which is really nice to see. No surrounding erythema. No purulent drainage Electronic Signature(s) Signed: 07/14/2019 5:30:55 PM By: Baltazar Najjar MD Entered By: Baltazar Najjar on 07/14/2019  15:02:17 -------------------------------------------------------------------------------- Physician Orders Details Patient Name: Date of Service: Eh, Sesay 07/14/2019 1:30 PM Medical Record [id number] Patient Account Number: [id number] Date of Birth/Sex: Treating RN: Mar 10, 1946 (73 y.o. Andrew Schultz) Yevonne Pax Primary Care Provider: Nita Sells Other Clinician: Referring Provider: Treating Provider/Extender:[name], [name], [name] in Treatment: 29 Verbal / Phone Orders: No Diagnosis Coding ICD-10 Coding Code Description [phone] Subacute osteomyelitis, left ankle and foot L97.524 Non-pressure chronic ulcer of other part of left foot with necrosis of bone E10.621 Type 1 diabetes mellitus with foot ulcer E10.40 Type 1 diabetes mellitus with diabetic neuropathy, unspecified M14.672 Charcot's joint, left ankle and foot Z89.511 Acquired absence of right leg below knee Follow-up Appointments Return Appointment in 1 week. Dressing Change Frequency Change Dressing every other day. Skin Barriers/Peri-Wound Care Barrier cream Wound Cleansing Wound #2 Left,Medial Foot May shower and wash wound with soap and water. Primary Wound Dressing Other: - endoform ag Secondary Dressing Wound #2 Left,Medial Foot Kerlix/Rolled Gauze ABD pad Other: - secure with tape Electronic Signature(s) Signed: 07/14/2019 5:30:55 PM By: Baltazar Najjar MD Signed: 11/03/2019 3:07:45 PM By: Yevonne Pax RN Entered By: Yevonne Pax on 07/14/2019 13:27:01 -------------------------------------------------------------------------------- Problem List Details Patient Name: Date of Service: Tovia, Kisner 07/14/2019 1:30 PM Medical Record [id number] Patient Account Number: [id number] Date of Birth/Sex: Treating RN: 05/06/1946 (73 y.o. Andrew Schultz) Jettie Pagan, Lyla Son Primary Care Provider: Nita Sells Other Clinician: Referring Provider: Treating Provider/Extender:[name], [name], [name] in Treatment:  29 Active Problems ICD-10 Evaluated Encounter Code Description Active Date Today Diagnosis M86.272 Subacute osteomyelitis, left ankle and foot 02/17/2019 No Yes L97.524 Non-pressure chronic ulcer of other part of left foot 02/17/2019 No Yes with necrosis of bone E10.621 Type 1 diabetes mellitus with foot ulcer 12/22/2018 No Yes E10.40 Type 1 diabetes mellitus with diabetic neuropathy, 12/22/2018 No Yes unspecified M14.672 Charcot's joint, left ankle and foot 12/22/2018 No Yes Z89.511 Acquired absence of right leg below knee 12/22/2018 No Yes Inactive Problems Resolved Problems Electronic Signature(s) Signed: 07/14/2019 5:30:55 PM By: Baltazar Najjar MD Entered By: Baltazar Najjar on 07/14/2019 15:00:07 -------------------------------------------------------------------------------- Progress Note Details Patient Name: Date of Service: Ikeem, Cleckler 07/14/2019 1:30 PM Medical Record [id number] Patient Account Number: [id number] Date of Birth/Sex: Treating RN: 1946-04-11 (73 y.o. 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 He is a retired Scientist, clinical (histocompatibility and immunogenetics). 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 He also has a history of diabetic angiopathy although this was previously worked up in [place]. He was told he had small vessel disease they could not be further revascularized. 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 He tells me he was cared for for a period of time in [place] had a wound care center and had hyperbaric oxygen. The patient  also has rheumatoid arthritis and is on methotrexate. The patient states he has been dealing with a wound on the right medial ankle since October. He has been applying Silvadene based dressings to this. He was seen in the ER on 10/31/16 at that point felt to have cellulitis and was given prescriptions for Augmentin and Bactrim. He was apparently given a podiatry follow-up. 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 There was diffuse soft tissue swelling about the ankle. He also had a duplex ultrasound of the right leg that was negative for DVT ABIs in this clinic were noncompressible 12/28/16; most of the information the patient brought back from [place] was from 2013 at that point he had skin ulcers and suspicion of osteomyelitis. MRI of the right foot showed a Charcot foot he had osteomyelitis in the fourth and fifth metatarsals. He has had removal of the fifth metatarsal on the left foot on 6/24 2011 amputation of the right fourth and one half of the great toe amputation of the left second toe left fifth toe. He saw vascular surgery in 2011. At that point the had non-invasive studies showing tibial artery disease Doppler waveforms were within normal limits. There was no evidence of arterial insufficiency of the left leg the right great toe pressure was unable to be obtained secondary to an amputation of the left great toe pressure was above the healing index. It was not felt that he would be a candidate for intervention at that point he was felt to have microvascular blood supply issues. It is very clear he will need follow-up vascular studies and probably venous reflux studies is aware of the right leg. We also have information from his podiatric surgeons in [facility] x-ray of the right foot showed severe destruction of the ankle joint secondary to Charcot form deformity. There are Charcot changes  visible to the ankle and midfoot joints. He was referred here. He has been using Santyl in the interim while he was traveling in [place] 01/04/17; not much change here. We have been using Santyl. He has arterial and venous studies next week asks that we can see him in 2 weeks which is reasonable. He had an ultrasound of his right leg rule out DVT in early December and they comment on no venous reflux however this was not a true reflux study. He clearly has venous insufficiency 01/18/17; the patient has undergone his ordered studies. His VENOUS REFLUX studies show no evidence of a DVT or superficial vein thrombophlebitis. There is evidence of great saphenous vein reflux in the right  lower extremity as well as reflux in the common femoral and popliteal vein. No reflux was noted in the small saphenous vein on the right. ARTERIAL studies showed an ABI on the right of 1.54, waveforms were biphasic at the posterior tibial and dorsalis pedis on the right. He could not have a TBi on the right secondary to prior amputation. There was a suggestion of falsely elevated left TBI due to calcified vessel with a left TBI of 0.95. The patient will definitely need to see vascular surgery with regards to his arterial and venous systems. 02/01/17; appointment with vascular surgery on April 4. Still using Santyl. 02/15/17; the patient was changed to Prisma 2 weeks ago at the time the wound was small but with some depth but a healthy base. Apparently everything was going well to 2 or 3 days ago when the patient and his wife noted deterioration wound. He came in today on his usual appointment. He has vascular surgery appointment on April 4. Considerable deterioration today necrotic material,. Drainage and exposed bone. Mild degree of erythema around the wound which was marked. Change the primary dressing to silver alginate. Augmentin and doxycycline until culture comes back. This was done dark drainage. MRI of the foot is  been ordered. He tells me that he has previously had osteomyelitis but I don't believe in this area 02/21/17; I saw this patient a week ago with a marked deterioration in the wound over his right medial malleolus. Culture at the time showed methicillin sensitive staph aureus. I've emperically put him on doxycycline on Augmentin last week. In the meantime he noted increasing swelling and drainage and was admitted to hospital from 3/25 through 3/27. He was given vancomycin however he was discharged on doxycycline for 10 days. He is still using silver alginate to the wound bed. He comes back today with a necrotic wound surface easily palpable friable necrotic bone underneath this. He has not been systemically unwell. He tells me he is trying his best offload this area and walking minimally. He has not been systemically unwell. 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 There is chronic partial destruction of the distal tibia and fibula, calcaneus and navicular. The navicular is posteriorly displaced. The talus is not definitely seen either destroyed her previously resected. With regards the ulcer this was noted. The wound abuts on the medial malleolus there is thick periosteal thickening but no gross cortical destruction there is a complex fluid collection at this aortic relation between the distal tibia and the calcaneus the feeling was that this was not osteomyelitis given the extensive Charcot changes but osteomyelitis could not definitely be excluded 03/01/17- patient is here for follow-up evaluation of his right medial foot/ankle ulceration. Did have tissue culture and bone pathology sent last week. Bone pathology reveals chronic osteomyelitis, no malignancy. The tissue culture resulted oxacillin sensitive staph aureus. He is currently on Keflex. 03/04/17; the patient's pathology showed chronic osteomyelitis. Bone culture revealed  methicillin sensitive staph aureus he is on Keflex. He arrives today in clinic with a piece of bone protruding out of the wound 03/11/17; with considerable difficulty and help of our staff 5 min able to get the patient appointment on Thursday with infectious disease and subtotally next Monday morning with Dr. Bennett Scrape podiatric ankle surgeon with [facility] with regards to the Charcot right ankle joint and underlying infection. He has been using silver alginate and remains on Keflex 03/18/17; the patient saw Dr. Bennett Scrape who outlined  to surgical options 1 a below-knee amputation and the other a debridement of the underlying fibula possibly part of the tibia with an external fixator. Pending the patient is leaning towards the amputation as even if this could possibly result in a functional limb he would be at risk for further Charcot complications in the future. We are using silver alginate to the wound and continuing Keflex. If he opts for an aggressive posture to attempt to save his leg then he'll probably need IV antibiotics and could be considered for hyperbaric oxygen however I doubt that's what he'll decide given his tone today READMISSION 12/22/2018 This is a patient with type 1 diabetes, severe peripheral neuropathy and severe Charcot foot and ankle deformities. We had him here in 2018 actually looking an area on the right foot. This did not heal in fact became worse and secondarily infected. He saw Dr. Bennett Scrape of podiatric surgery at [facility]. 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 He has a prosthesis and was doing well. He tells me that late in October he developed a small blister on the medial aspect of his left first metatarsal head. He was followed by podiatry initially with Iodosorb and then 2 weeks ago with Santyl to the wound area. His wife changes the dressings. 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 Since he was last here he had a left third toe amputation which healed well. ABIs in this clinic on the left were noncompressible 2/10; the patient's area on the medial aspect of the left great toe. Since the last time the patient was here 2 weeks ago this is actually deteriorated now with exposed tendon. He has been using Santyl. The patient had arterial studies done in February 2018. On the left this showed noncompressible vessels bilaterally but with a TBI of 0.95. Waveforms were biphasic at the posterior tibial and dorsalis pedis. 2/17; x-ray from last week showed the third toe amputation. Medial soft tissue ulceration without evidence of osteomyelitis. We have been using Santyl. His arterial studies to compare with the ones done 2 years ago are tomorrow. Run Grafix PL through his insurance 2/25. Arterial studies were done on 2/18. On the left this showed noncompressible vessels but with triphasic wave waveforms at the PTA and biphasic at the dorsalis pedis. TBI was 0.55 down from 0.95 2 years ago. His wound is on the medial left first metatarsal head. This does not have a viable surface dry and exposed tendon. 3/10/; patient did not have his vascular consult done because of an error in sending the orders. This will be done today. He arrives today with some erythema around the wound nothing but exposed tendon. I have been keeping this moist with hydrogel and silver collagen. He will need an MRI a vascular surgery consult. 3/24; he still not has not seen vascular surgery however he was admitted to hospital from 3/12 through 02/07/2019. He was felt to have acute osteomyelitis of the left foot. He was seen by his  podiatrist at Dr. Allena Katz. There were no cultures done. He was initially treated with vancomycin cefepime and Flagyl however discharged on vancomycin and Augmentin. He has MRI documented  septic arthritis and osteomyelitis of the first metatarsal head and neck. The patient arrived in clinic with a sheet of concerns predominantly that [facility] did not have orders beyond Thursday morning. I E as antibiotics would stop. 3/31; he is now on vancomycin and ertapenem as directed by infectious disease. We have everybody on board. He is going for his angiogram tomorrow although I have not yet reviewed vein and vascular's notes. We have been using Santyl on the wound bed 4/7; he apparently had some renal insufficiency issues and his vancomycin was temporarily on hold. He did have his angiogram done on 4/1; this did not show any significant stenosis. There was three-vessel runoff. The dominant vessel was the anterior tibial. There was some evidence of digital arterial atherosclerotic disease but certainly nothing that required an intervention from a macrovascular point of view. In preparation for possible HBO I am going to order a hemoglobin A1c. Also notable that he has a low albumin level. He has had a reduction in his appetite which he is attributing to antibiotic therapy. 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 I will need to research what we know about this on [place] link i.e. has he ever had urine studies 4/14; his vancomycin is still on hold as far as the patient is aware. I find this somewhat unusual unless there is more to this than I realized. We have been using Santyl to the wound 4/21; patient seen in conjunction with HBO. I did get some lab work from home health. On 4/31 his vancomycin level was in the 90s. Essentially it is been on hold since then. Lab work that was sent to me today dated 4/20 shows a vancomycin level of 17.8. Fortunately his BUN and creatinine  are both normal at 20 and 1.0. His white count is 5.6 hemoglobin 11.5. I also have from 4/17 a sedimentation rate of 96 and a C-reactive protein of 20 although the patient has rheumatoid arthritis. He also tells me that his methotrexate and biologic agent have been put on hold by his rheumatologist because of the underlying osteomyelitis. Finally I am not exactly sure who is following his antibiotics after discussion with the patient today. I did involve Dr. Megan Salon via secure text I think when it became clear that his antibiotics are running out after his hospitalization. I also discussed things with his primary doctor Dr. Wende Neighbors in [place]. But I am not exactly sure who is following the labs. He has been tolerating HBO well. 4/28; the patient is tolerating HBO well. There is been improvements in his wound dimension especially the undermining at 1:00 towards the medial foot. Today measured at 1.2 cm. We have been in contact with home health. The antibiotics finished on Sunday. He still has his PICC line in place. C-reactive protein and sedimentation rate were measured yesterday. His methotrexate and biologic agent are still on hold he has not had any trouble with increasing pain from his RA 5/5; the patient is tolerating HBO well. He is seen today in conjunction with that his wound is improved had been improving although it is not much different from last week. Undermining towards the medial foot today measured at 1.8 up from 1.2 last week. Went to see Dr. Zadie Rhine of ophthalmology. Fortunately did not have anything major wrong. Most of the symptoms he was having in his eyes were related to pseudophakia. He feels better today and he tolerated his treatment well. We are using endoform. Previously  his insurance which is through [employer] had refused them for Grafix. I am going to run Dermagraft I also looked over his inflammatory markers his sed rate is still over 100. I do not know  how to interpret this in this patient who has rheumatoid arthritis. I am not sure that the persistence of an elevated sed rate in this man means of treatment failure 5/12; the patient was seen today for wound care evaluation in conjunction with hyperbarics that he is tolerating well. We have been using endoform. The undermining area to along the medial aspect of his foot and come down nicely however on our evaluation today that is gone back up to 4 cm which is really very disappointing. Other than that the wound bed paradoxically looks somewhat better 5/19- Patient seen after HBO, the ulcer now undermines to 3.5 cm which is better than 4.0 last time 5/26; patient was seen after HBO. He apparently had a single Dermagraft applied last week. I applied Dermagraft No. 2. Careful attention to the tunneling along the medial foot which currently measures 3.5 cm about the same as last time 6/2; patient was seen after HBO. Dermagraft No. 3 applied. Tunneling area improved condition of the wound bed and improved. 6/9; patient was seen after HBO. The tunneling area is about 1 cm about the same as last time. Dermagraft No. 4 applied. We are also going to recertify him for hyperbarics which is really seems to have helped the wound healing process. 6/16; patient was seen after HBO. The tunneling area has deepened this week towards the medial part of his foot but that had come in last week. Nevertheless the wound bed looks healthy. Dermagraft No. 5 applied after debridement 6/23. Patient was seen after HBO tunneling area is down from 4 to 3.6 cm. I think we have had some contraction of the surface area. There is no palpable bone. No evidence of infection. Dermagraft No. 6 applied 6/30; the tunneling expanded once again to 7 cm. Yet the wound itself looks actually quite good. Dermagraft No. 7 7/7; tunneling at 4.5. Wound dimensions are down from last week especially in width. Dermagraft No. 8 7/14; the  tunneling is down in the wound has less with. What I can see the base of the wound looks healthy. We will change him to endoform AG. He continues along with HBO we are making progress albeit slow 7/21; tunneling in the most proximal part of this wound is at 1 cm. The rest of this is down to a linear slitlike area. Not much with. No evidence of infection 7/28; the tunneling and the most proximal part of this wound is down to 0.3 cm measured by myself otherwise it remains a linear slitlike area. The base of this appears to be healthy. No evidence of infection. He is seen today along with hyperbarics 8/11-Left medial foot wound has covering of eschar-like tissue but it is otherwise small opening. 8/18; left medial foot using endoform. Small opening what I can see of the base of this looks healthy. Most impressively the tunneling area towards the medial part of his foot is close down there is some undermining from roughly 1:00 to 4:00 of roughly 2 mm. Objective Constitutional Sitting or standing Blood Pressure is within target range for patient.. Pulse regular and within target range for patient.Marland Kitchen. Respirations regular, non-labored and within target range.. Temperature is normal and within the target range for the patient.Marland Kitchen. Appears in no distress. Vitals Time Taken: 1:38 PM, Height:  67 in, Weight: 208 lbs, BMI: 32.6, Temperature: 98.4 F, Pulse: 77 bpm, Respiratory Rate: 15 breaths/min, Blood Pressure: 101/58 mmHg, Capillary Blood Glucose: 109 mg/dl. Eyes Conjunctivae clear. No discharge.no icterus. Respiratory work of breathing is normal. Cardiovascular Pedal pulses are palpable. Psychiatric appears at normal baseline. General Notes: Wound exam; left medial foot wound was debrided last week. I did not feel any debridement was necessary. What I can see of the base of this wound looks like reasonably healthy tissue. Scant amount of undermining from 1-4 o'clock. The tunnel at one point that  went down to his left medial foot has closed down completely which is really nice to see. No surrounding erythema. No purulent drainage Integumentary (Hair, Skin) No erythema around the wound. Wound #2 status is Open. Original cause of wound was Gradually Appeared. The wound is located on the Left,Medial Foot. The wound measures 0.5cm length x 0.2cm width x 0.5cm depth; 0.079cm^2 area and 0.039cm^3 volume. There is Fat Layer (Subcutaneous Tissue) Exposed exposed. There is no tunneling noted, however, there is undermining starting at 5:00 and ending at 7:00 with a maximum distance of 0.4cm. There is a small amount of serosanguineous drainage noted. The wound margin is flat and intact. There is large (67-100%) red granulation within the wound bed. There is no necrotic tissue within the wound bed. 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 Dressing Change Frequency: Change Dressing every other day. Skin Barriers/Peri-Wound Care: Barrier cream Wound Cleansing: Wound #2 Left,Medial Foot: May shower and wash wound with soap and water. Primary Wound Dressing: Other: - endoform ag Secondary Dressing: Wound #2 Left,Medial Foot: Kerlix/Rolled Gauze ABD pad Other: - secure with tape 1. Continue with the endoform 2. Follow-up next week 3. Careful attention to the wound depth. the question is can we get this to granulate Electronic Signature(s) Signed: 07/14/2019 5:30:55 PM By: Baltazar Najjar MD Entered By: Baltazar Najjar on 07/14/2019 15:02:55 -------------------------------------------------------------------------------- SuperBill  Details Patient Name: Date of Service: Raed, Schalk 07/14/2019 Medical Record [id number] Patient Account Number: [id number] Date of Birth/Sex: Treating RN: [birth year] (73 y.o. Andrew Schultz) Yevonne Pax Primary Care Provider: Nita Sells Other Clinician: Referring Provider: Treating Provider/Extender:[name], [name], [name] in Treatment: 29 Diagnosis Coding ICD-10 Codes Code Description [phone] Subacute osteomyelitis, left ankle and foot L97.524 Non-pressure chronic ulcer of other part of left foot with necrosis of bone E10.621 Type 1 diabetes mellitus with foot ulcer E10.40 Type 1 diabetes mellitus with diabetic neuropathy, unspecified M14.672 Charcot's joint, left ankle and foot Z89.511 Acquired absence of right leg below knee Facility Procedures CPT4 Code: [id number] Description: 99213 - WOUND CARE VISIT-LEV 3 EST PT Modifier: Quantity: 1 Physician Procedures CPT4 Code Description: [id number] 99213 - WC PHYS LEVEL 3 - EST PT ICD-10 Diagnosis Description L97.524 Non-pressure chronic ulcer of other part of left foot with M86.272 Subacute osteomyelitis, left ankle and foot Modifier: necrosis of b Quantity: 1 one Electronic Signature(s) Signed: 07/14/2019 5:30:55 PM By: Baltazar Najjar MD Entered By: Baltazar Najjar on 07/14/2019 15:03:15

## 2020-03-26 IMAGING — MR MRI OF THE LEFT FOOT WITHOUT CONTRAST
4 of 5 series · 21 of 40 positions shown · non-contrast
Comparison: Radiographs 02/05/2019

CLINICAL DATA: Chronic left foot ulcer.  Fever.

EXAM:
MRI OF THE LEFT FOOT WITHOUT CONTRAST
TECHNIQUE: Multiplanar, multisequence MR imaging of the left foot was
performed. No intravenous contrast was administered.

[Series 6: T1 · coronal · 4.0mm · 0.28mm/px · 8 of 33 slices shown (1 of 2)]
[im 1/33]
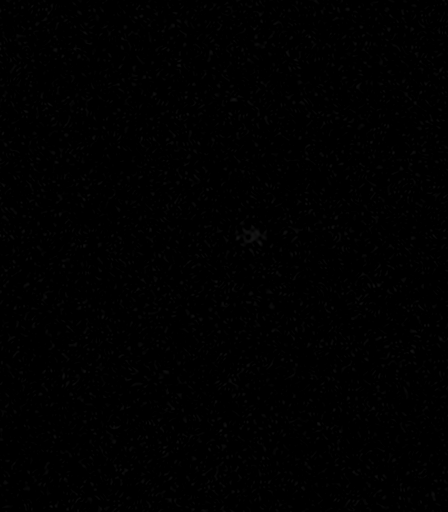
[im 4/33]
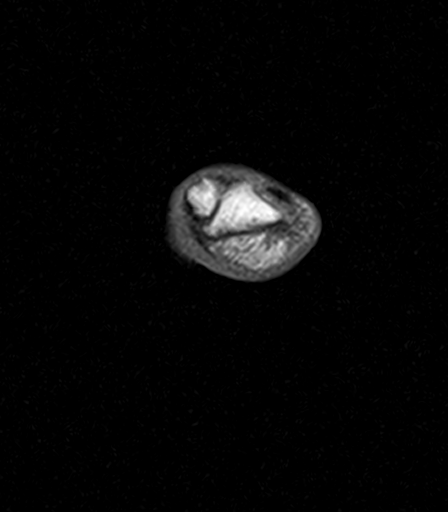
[im 11/33]
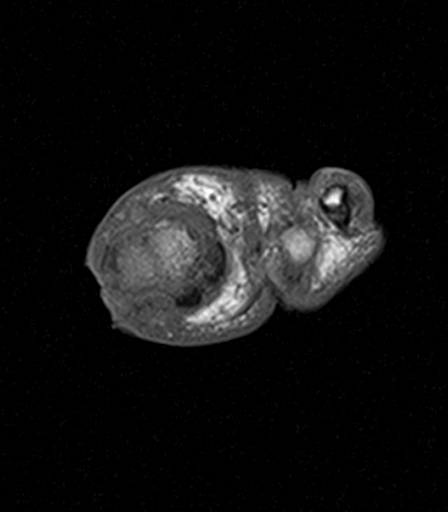
[im 15/33]
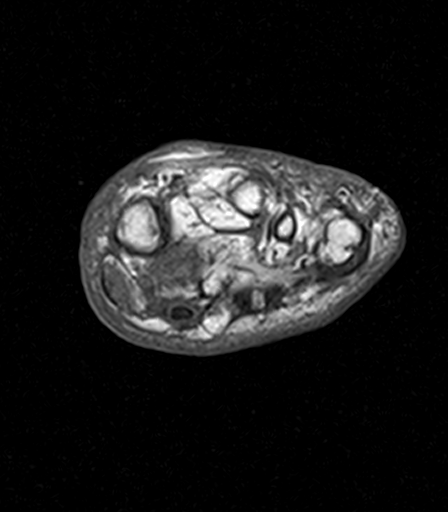
[im 18/33]
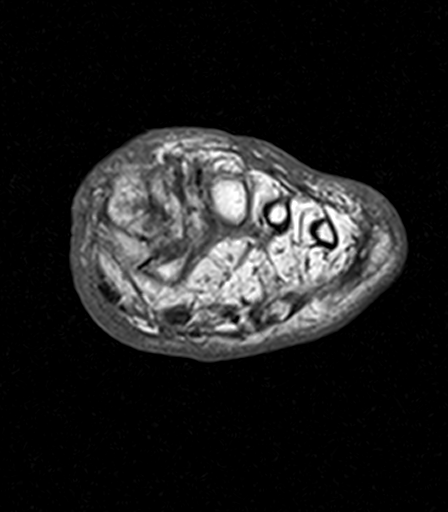
[im 22/33]
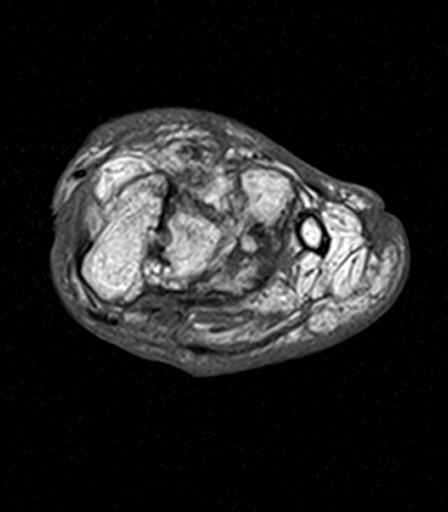
[im 29/33]
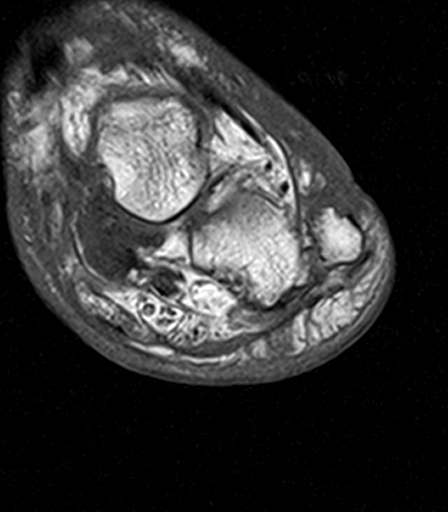
[im 33/33]
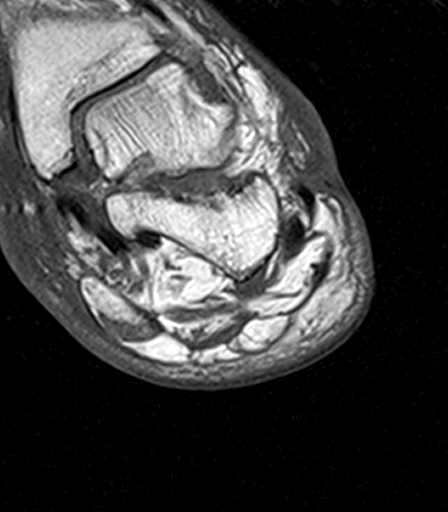

[Series 7: t2fs axial · coronal · 4.0mm · 0.28mm/px · 4 of 33 slices shown]
[im 1/33]
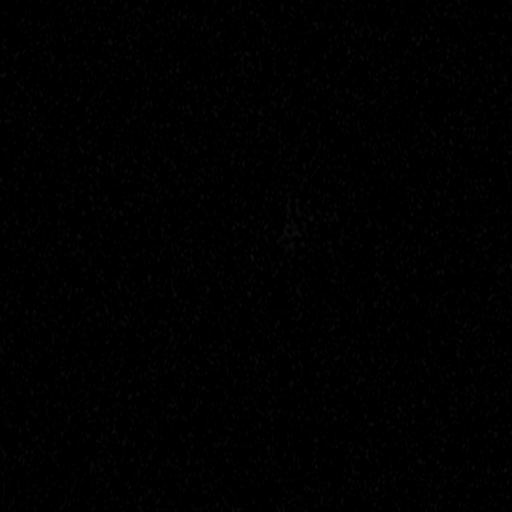
[im 4/33]
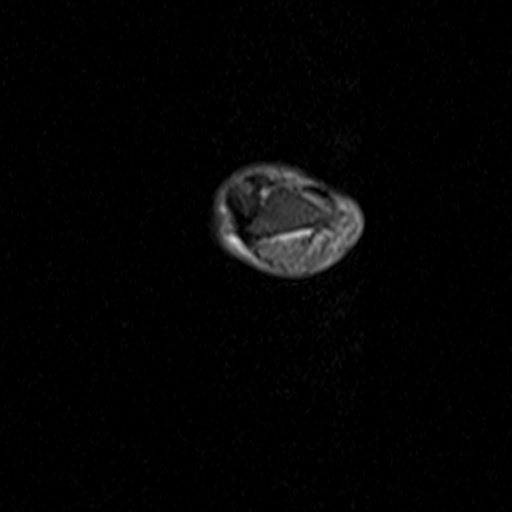
[im 17/33]
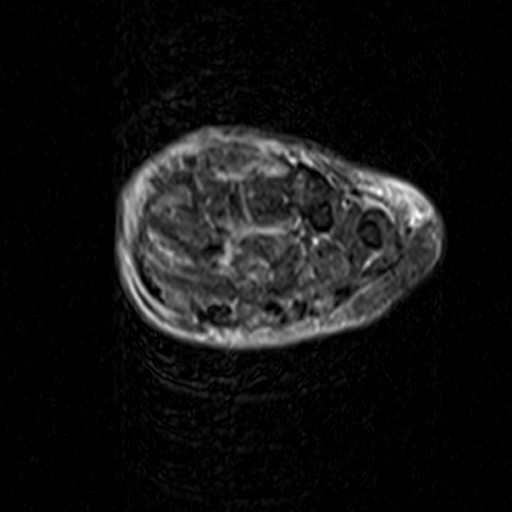
[im 29/33]
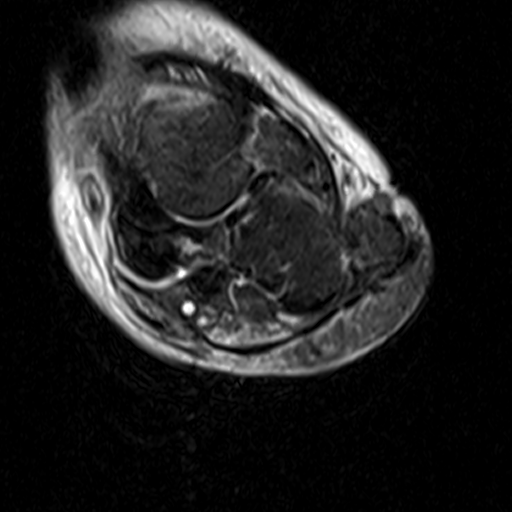

[Series 10: T1 · oblique · 4.0mm · 0.32mm/px · 6 of 17 slices shown (2 of 2)]
[im 1/17]
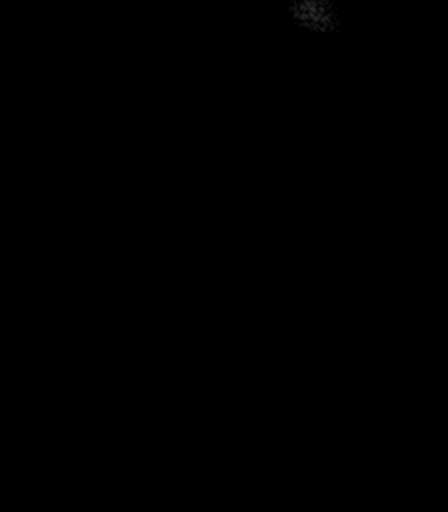
[im 4/17]
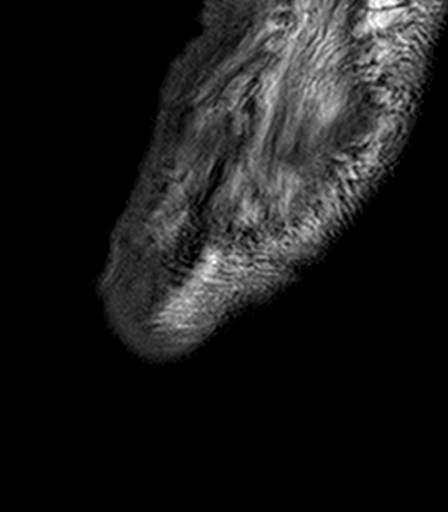
[im 7/17]
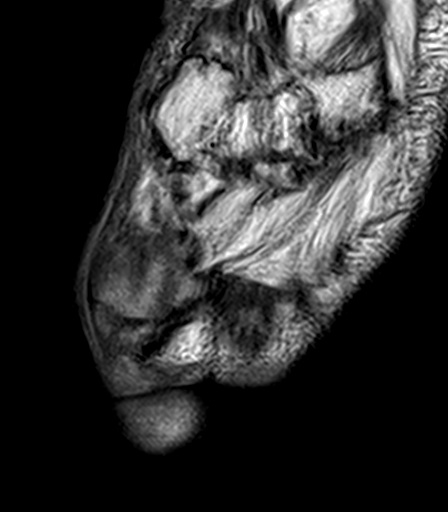
[im 10/17]
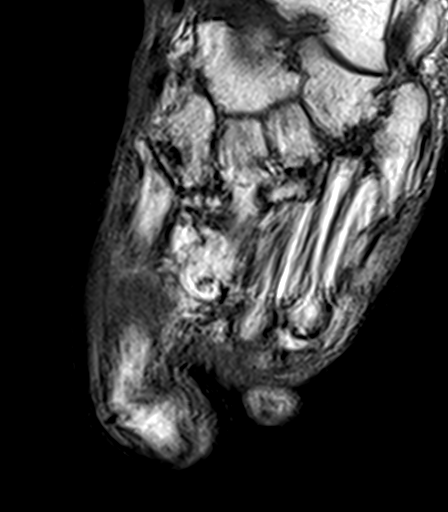
[im 13/17]
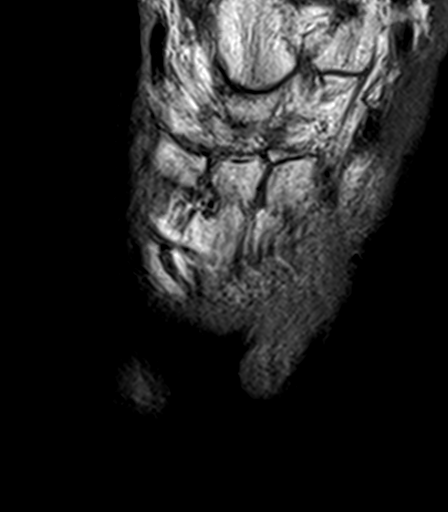
[im 17/17]
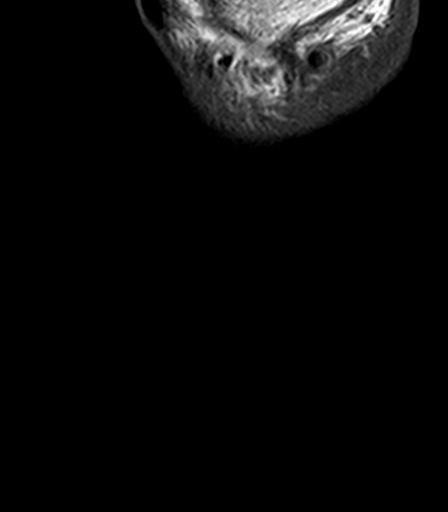

[Series 11: t2fs cor · oblique · 4.0mm · 0.32mm/px · 3 of 16 slices shown]
[im 1/16]
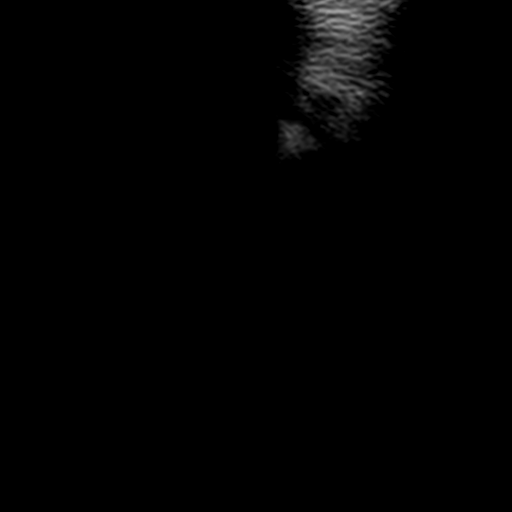
[im 8/16]
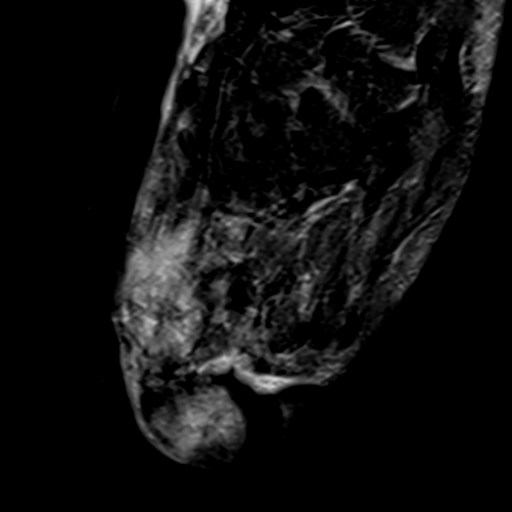
[im 16/16]
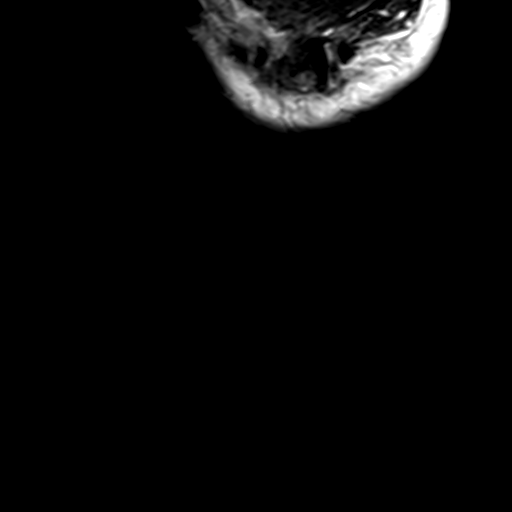

[21 of 40 positions shown; findings below may reference images not displayed]

FINDINGS: There is an open wound along the medial aspect of the first MTP
joint which appears to extend right down to the bone. Abnormal T1
and T2 signal intensity in the first metatarsal head and neck and
proximal phalanx consistent with septic arthritis and osteomyelitis.
There is also osteomyelitis involving the sesamoid bones.

No discrete drainable soft tissue abscess but there is moderate
cellulitis. No findings for pyomyositis.

Evidence of prior amputations and severe chronic midfoot
degenerative changes, likely Charcot changes.
IMPRESSION: 1. MR findings consistent with septic arthritis at the first MTP
joint with osteomyelitis involving the first metatarsal, first
proximal phalanx and also the sesamoid bones of the great toe.
2. Cellulitis without findings for discrete drainable abscess or
pyomyositis.

## 2020-05-21 ENCOUNTER — Encounter (HOSPITAL_COMMUNITY): Payer: Self-pay | Admitting: *Deleted

## 2020-05-21 ENCOUNTER — Other Ambulatory Visit: Payer: Self-pay

## 2020-05-21 ENCOUNTER — Inpatient Hospital Stay (HOSPITAL_COMMUNITY)
Admission: AD | Admit: 2020-05-21 | Discharge: 2020-05-24 | DRG: 244 | Disposition: A | Discharge status: Home / Self Care | Payer: Medicare Other | Attending: Cardiology | Admitting: Cardiology

## 2020-05-21 ENCOUNTER — Emergency Department (HOSPITAL_COMMUNITY): Payer: Medicare Other

## 2020-05-21 DIAGNOSIS — Z89511 Acquired absence of right leg below knee: Secondary | ICD-10-CM | POA: Diagnosis not present

## 2020-05-21 DIAGNOSIS — Z7982 Long term (current) use of aspirin: Secondary | ICD-10-CM

## 2020-05-21 DIAGNOSIS — G4733 Obstructive sleep apnea (adult) (pediatric): Secondary | ICD-10-CM | POA: Diagnosis present

## 2020-05-21 DIAGNOSIS — E1022 Type 1 diabetes mellitus with diabetic chronic kidney disease: Secondary | ICD-10-CM | POA: Diagnosis present

## 2020-05-21 DIAGNOSIS — Z79899 Other long term (current) drug therapy: Secondary | ICD-10-CM | POA: Diagnosis not present

## 2020-05-21 DIAGNOSIS — I442 Atrioventricular block, complete: Principal | ICD-10-CM | POA: Diagnosis present

## 2020-05-21 DIAGNOSIS — E10649 Type 1 diabetes mellitus with hypoglycemia without coma: Secondary | ICD-10-CM | POA: Diagnosis present

## 2020-05-21 DIAGNOSIS — E1051 Type 1 diabetes mellitus with diabetic peripheral angiopathy without gangrene: Secondary | ICD-10-CM | POA: Diagnosis present

## 2020-05-21 DIAGNOSIS — M069 Rheumatoid arthritis, unspecified: Secondary | ICD-10-CM | POA: Diagnosis present

## 2020-05-21 DIAGNOSIS — Z9641 Presence of insulin pump (external) (internal): Secondary | ICD-10-CM | POA: Diagnosis present

## 2020-05-21 DIAGNOSIS — N183 Chronic kidney disease, stage 3 unspecified: Secondary | ICD-10-CM | POA: Diagnosis present

## 2020-05-21 DIAGNOSIS — R001 Bradycardia, unspecified: Secondary | ICD-10-CM | POA: Diagnosis present

## 2020-05-21 DIAGNOSIS — Z950 Presence of cardiac pacemaker: Secondary | ICD-10-CM

## 2020-05-21 DIAGNOSIS — I4892 Unspecified atrial flutter: Secondary | ICD-10-CM | POA: Diagnosis not present

## 2020-05-21 DIAGNOSIS — I358 Other nonrheumatic aortic valve disorders: Secondary | ICD-10-CM | POA: Diagnosis present

## 2020-05-21 DIAGNOSIS — Z20822 Contact with and (suspected) exposure to covid-19: Secondary | ICD-10-CM | POA: Diagnosis present

## 2020-05-21 LAB — CBC
HCT: 44.1 % (ref 39.0–52.0)
HCT: 42.3 % (ref 39.0–52.0)
Hemoglobin: 14.5 g/dL (ref 13.0–17.0)
Hemoglobin: 13.6 g/dL (ref 13.0–17.0)
MCH: 31.7 pg (ref 26.0–34.0)
MCH: 31.1 pg (ref 26.0–34.0)
MCHC: 32.9 g/dL (ref 30.0–36.0)
MCHC: 32.2 g/dL (ref 30.0–36.0)
MCV: 96.5 fL (ref 80.0–100.0)
MCV: 96.6 fL (ref 80.0–100.0)
Platelets: 153 10*3/uL (ref 150–400)
Platelets: 144 K/uL — ABNORMAL LOW (ref 150–400)
RBC: 4.57 MIL/uL (ref 4.22–5.81)
RBC: 4.38 MIL/uL (ref 4.22–5.81)
RDW: 15 % (ref 11.5–15.5)
RDW: 15.2 % (ref 11.5–15.5)
WBC: 5.9 10*3/uL (ref 4.0–10.5)
WBC: 6.2 K/uL (ref 4.0–10.5)
nRBC: 0 % (ref 0.0–0.2)
nRBC: 0 % (ref 0.0–0.2)

## 2020-05-21 LAB — BASIC METABOLIC PANEL
Anion gap: 10 (ref 5–15)
BUN: 27 mg/dL — ABNORMAL HIGH (ref 8–23)
CO2: 20 mmol/L — ABNORMAL LOW (ref 22–32)
Calcium: 8.5 mg/dL — ABNORMAL LOW (ref 8.9–10.3)
Chloride: 106 mmol/L (ref 98–111)
Creatinine, Ser: 1.67 mg/dL — ABNORMAL HIGH (ref 0.61–1.24)
GFR calc Af Amer: 46 mL/min — ABNORMAL LOW (ref >60)
GFR calc non Af Amer: 39 mL/min — ABNORMAL LOW (ref >60)
Glucose, Bld: 214 mg/dL — ABNORMAL HIGH (ref 70–99)
Potassium: 4.4 mmol/L (ref 3.5–5.1)
Sodium: 136 mmol/L (ref 135–145)

## 2020-05-21 LAB — GLUCOSE, CAPILLARY
Glucose-Capillary: 46 mg/dL — ABNORMAL LOW (ref 70–99)
Glucose-Capillary: 48 mg/dL — ABNORMAL LOW (ref 70–99)
Glucose-Capillary: 42 mg/dL — CL (ref 70–99)
Glucose-Capillary: 78 mg/dL (ref 70–99)
Glucose-Capillary: 141 mg/dL — ABNORMAL HIGH (ref 70–99)

## 2020-05-21 LAB — HEMOGLOBIN A1C
Hgb A1c MFr Bld: 7.8 % — ABNORMAL HIGH (ref 4.8–5.6)
Mean Plasma Glucose: 177.16 mg/dL

## 2020-05-21 LAB — BASIC METABOLIC PANEL WITH GFR
Anion gap: 8 (ref 5–15)
BUN: 23 mg/dL (ref 8–23)
CO2: 26 mmol/L (ref 22–32)
Calcium: 8.3 mg/dL — ABNORMAL LOW (ref 8.9–10.3)
Chloride: 106 mmol/L (ref 98–111)
Creatinine, Ser: 1.61 mg/dL — ABNORMAL HIGH (ref 0.61–1.24)
GFR calc Af Amer: 48 mL/min — ABNORMAL LOW
GFR calc non Af Amer: 41 mL/min — ABNORMAL LOW
Glucose, Bld: 77 mg/dL (ref 70–99)
Potassium: 4.6 mmol/L (ref 3.5–5.1)
Sodium: 140 mmol/L (ref 135–145)

## 2020-05-21 LAB — TROPONIN I (HIGH SENSITIVITY)
Troponin I (High Sensitivity): 10 ng/L (ref <18)
Troponin I (High Sensitivity): 9 ng/L (ref <18)

## 2020-05-21 LAB — SARS CORONAVIRUS 2 BY RT PCR (HOSPITAL ORDER, PERFORMED IN ~~LOC~~ HOSPITAL LAB): SARS Coronavirus 2: NEGATIVE

## 2020-05-21 MED ORDER — ACETAMINOPHEN 325 MG PO TABS: 650.0000 mg | ORAL_TABLET | ORAL | Status: DC | PRN

## 2020-05-21 MED ORDER — FOLIC ACID 1 MG PO TABS
500.0000 ug | ORAL_TABLET | Freq: Every day | ORAL | Status: DC
  Administered 2020-05-22 – 2020-05-24 (×2): 0.5 mg via ORAL
  Filled 2020-05-21 (×2): qty 1

## 2020-05-21 MED ORDER — METHOTREXATE 2.5 MG PO TABS
10.0000 mg | ORAL_TABLET | ORAL | Status: DC
  Administered 2020-05-22: 10 mg via ORAL
  Filled 2020-05-21: qty 4

## 2020-05-21 MED ORDER — VITAMIN E 180 MG (400 UNIT) PO CAPS
400.0000 [IU] | ORAL_CAPSULE | Freq: Every day | ORAL | Status: DC
  Administered 2020-05-22 – 2020-05-24 (×2): 400 [IU] via ORAL
  Filled 2020-05-21 (×3): qty 1

## 2020-05-21 MED ORDER — SODIUM CHLORIDE 0.9 % IV BOLUS
500.0000 mL | Freq: Once | INTRAVENOUS | Status: AC
  Administered 2020-05-21: 500 mL via INTRAVENOUS

## 2020-05-21 MED ORDER — VITAMIN E 45 MG (100 UNIT) PO CAPS: 1000.0000 [IU] | ORAL_CAPSULE | Freq: Every day | ORAL | Status: DC

## 2020-05-21 MED ORDER — HEPARIN SODIUM (PORCINE) 5000 UNIT/ML IJ SOLN
5000.0000 [IU] | Freq: Three times a day (TID) | INTRAMUSCULAR | Status: DC
  Administered 2020-05-21 – 2020-05-22 (×2): 5000 [IU] via SUBCUTANEOUS
  Filled 2020-05-21 (×2): qty 1

## 2020-05-21 MED ORDER — VITAMIN D 25 MCG (1000 UNIT) PO TABS
2000.0000 [IU] | ORAL_TABLET | Freq: Every day | ORAL | Status: DC
  Administered 2020-05-22 – 2020-05-24 (×2): 2000 [IU] via ORAL
  Filled 2020-05-21 (×3): qty 2

## 2020-05-21 MED ORDER — SODIUM CHLORIDE 0.9 % IV BOLUS
1000.0000 mL | Freq: Once | INTRAVENOUS | Status: AC
  Administered 2020-05-21: 1000 mL via INTRAVENOUS

## 2020-05-21 MED ORDER — INSULIN PUMP
Freq: Three times a day (TID) | SUBCUTANEOUS | Status: DC
  Filled 2020-05-21: qty 1

## 2020-05-21 MED ORDER — POLYVINYL ALCOHOL 1.4 % OP SOLN
1.0000 [drp] | Freq: Three times a day (TID) | OPHTHALMIC | Status: DC | PRN
  Filled 2020-05-21: qty 15

## 2020-05-21 MED ORDER — ASPIRIN EC 81 MG PO TBEC
81.0000 mg | DELAYED_RELEASE_TABLET | Freq: Every day | ORAL | Status: DC
  Administered 2020-05-22 – 2020-05-24 (×2): 81 mg via ORAL
  Filled 2020-05-21 (×2): qty 1

## 2020-05-21 MED ORDER — DEXTROSE 50 % IV SOLN
INTRAVENOUS | Status: AC
  Filled 2020-05-21: qty 50

## 2020-05-21 MED ORDER — PANTOPRAZOLE SODIUM 40 MG PO TBEC
40.0000 mg | DELAYED_RELEASE_TABLET | Freq: Every day | ORAL | Status: DC
  Administered 2020-05-22 – 2020-05-24 (×2): 40 mg via ORAL
  Filled 2020-05-21 (×3): qty 1

## 2020-05-21 MED ORDER — ATROPINE SULFATE 1 MG/10ML IJ SOSY
0.5000 mg | PREFILLED_SYRINGE | Freq: Once | INTRAMUSCULAR | Status: AC
  Administered 2020-05-21: 0.5 mg via INTRAVENOUS
  Filled 2020-05-21: qty 10

## 2020-05-21 MED ORDER — ATROPINE SULFATE 1 MG/10ML IJ SOSY
0.5000 mg | PREFILLED_SYRINGE | Freq: Once | INTRAMUSCULAR | Status: AC
  Administered 2020-05-21: 0.5 mg via INTRAVENOUS

## 2020-05-21 MED ORDER — CARBOXYMETHYLCELLULOSE SODIUM 0.5 % OP SOLN: 1.0000 [drp] | Freq: Three times a day (TID) | OPHTHALMIC | Status: DC | PRN

## 2020-05-21 MED ORDER — ASPIRIN EC 81 MG PO TBEC: 81.0000 mg | DELAYED_RELEASE_TABLET | Freq: Every day | ORAL | Status: DC

## 2020-05-21 MED ORDER — LEFLUNOMIDE 20 MG PO TABS
10.0000 mg | ORAL_TABLET | Freq: Every day | ORAL | Status: DC
  Administered 2020-05-22 – 2020-05-24 (×2): 10 mg via ORAL
  Filled 2020-05-21 (×3): qty 0.5

## 2020-05-21 MED ORDER — ADULT MULTIVITAMIN W/MINERALS CH
1.0000 | ORAL_TABLET | Freq: Every morning | ORAL | Status: DC
  Administered 2020-05-22 – 2020-05-24 (×2): 1 via ORAL
  Filled 2020-05-21 (×3): qty 1

## 2020-05-21 NOTE — ED Notes (Signed)
Pacer pads placed on pt.

## 2020-05-21 NOTE — ED Triage Notes (Signed)
Patient presents to the ED with complaints of hypotension.  Patient reports feeling light headed at home and took his own blood pressure at home resulting 117/48.

## 2020-05-21 NOTE — ED Notes (Signed)
Patient left unit via [facility]

## 2020-05-21 NOTE — Progress Notes (Signed)
Paged and notified on call Cardiology that pt's HR has been dropping to 29 when asleep, otherwise low 30-40's. Pt asymptomatic, Bp 117/50. zole pads present in pt's chest. Call bell within reach. Will continue to monitor.

## 2020-05-21 NOTE — Progress Notes (Signed)
Pt arrived from unit from [facility] 3rd degree heart block. BP 126/51 (73) SpO2 95 RA. HR 33. Pt. Has insulin pump. CBG check blood sugar 46.MD Dr Charna Busman on call notified no new order  .Will continue to monitor. Pt. Oriented to unit   Everlean Cherry, RN

## 2020-05-21 NOTE — ED Provider Notes (Addendum)
[facility] EMERGENCY DEPARTMENT Provider Note   CSN: [id number] Arrival date & time: 05/21/20  1156     History Chief Complaint  Patient presents with  . Hypotension    Andrew Schultz is a 74 y.o. male.  Pt presents to the ED today with feeling lightheaded.  He took his BP at home and it was 117/48.  He said the bottom number was low for him.  He did not check his HR.  He denies any cp or sob.  He has no heart history.          Past Medical History:  Diagnosis Date  . Apnea, sleep   . CKD (chronic kidney disease)   . Diabetes mellitus without complication ([place])   . GERD (gastroesophageal reflux disease)   . Hyperkalemia   . Neuropathy   . Obstructive sleep apnea on CPAP   . RA (rheumatoid arthritis) ([place])   . Renal disorder     Patient Active Problem List   Diagnosis Date Noted  . Complete heart block ([place]) 05/21/2020  . Septic arthritis of IP joint of toe, left ([place]) 02/07/2019  . Diabetic nephropathy ([place]) 02/07/2019  . Acute osteomyelitis of left foot ([place]) 02/05/2019  . Diabetic neuropathy ([place]) 02/05/2019  . Diabetic foot infection ([place]) 03/14/2017  . Diabetic foot ulcer ([place]) 02/17/2017  . RA (rheumatoid arthritis) ([place])   . Obstructive sleep apnea on CPAP   . Neuropathy     Past Surgical History:  Procedure Laterality Date  . ABDOMINAL AORTOGRAM N/A 02/25/2019   Procedure: ABDOMINAL AORTOGRAM;  Surgeon: Serafina Mitchell, MD;  Location: [place] CV LAB;  Service: Cardiovascular;  Laterality: N/A;  . ambutation toes    . AMPUTATION Left 09/10/2018   Procedure: AMPUTATION DIGIT LEFT 3RD TOE;  Surgeon: Tyson Babinski, DPM;  Location: [facility] ORS;  Service: Podiatry;  Laterality: Left;  . CHOLECYSTECTOMY    . 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 LOWER EXTREMITY ANGIOGRAPHY Left 02/25/2019   Procedure: Lower Extremity Angiography;  Surgeon: Serafina Mitchell, MD;  Location: [place] CV LAB;  Service: Cardiovascular;  Laterality: Left;        History reviewed. No pertinent family history.  Social History   Tobacco Use  . Smoking status: Never Smoker  . Smokeless tobacco: Never Used  Vaping Use  . Vaping Use: Never used  Substance Use Topics  . Alcohol use: No  . Drug use: No    Home Medications Prior to Admission medications   Medication Sig Start Date End Date Taking? Authorizing Provider  aspirin EC 81 MG tablet Take 81 mg by mouth daily.   Yes [provider]  carboxymethylcellulose (REFRESH PLUS) 0.5 % SOLN Apply 1 drop to eye 3 (three) times daily as needed (for chronic dry eye).    Yes [provider]  Cholecalciferol 2000 units CAPS Take 2,000 Units by mouth daily.   Yes [provider]  erythromycin ophthalmic ointment Place 1 application into both eyes at bedtime.  01/28/20 01/27/21 Yes [provider]  folic acid (FOLVITE) 962 MCG tablet 400 mcg by Does not apply route daily.    Yes [provider]  HUMALOG 100 UNIT/ML injection 80-85 Units by Pump Prime route See admin instructions. Approximately 80-85 units/24hrs via insulin pump 07/11/15  Yes [provider]  leflunomide (ARAVA) 10 MG tablet Take 10 mg by mouth daily.   Yes [provider]  methotrexate (RHEUMATREX) 2.5 MG tablet Take 10 mg by mouth  every Sunday.  07/06/15  Yes [provider]  Multiple Vitamin (MULTIVITAMIN WITH MINERALS) TABS tablet Take 1 tablet by mouth every morning.   Yes [provider]  Omega-3 Fatty Acids (FISH OIL) 1000 MG CAPS by Does not apply route daily.   Yes [provider]  omeprazole (PRILOSEC) 10 MG capsule Take 20 mg by mouth every morning.    Yes [provider]  RESTASIS 0.05 % ophthalmic emulsion Place 1 drop into both eyes 2 (two) times daily.  01/26/19  Yes [provider]  vitamin E 1000 UNIT capsule Take 1,000 Units by mouth daily.   Yes [provider]  alendronate (FOSAMAX) 70 MG tablet Take 1  tablet by mouth every 7 (seven) days. Patient not taking: Reported on 05/21/2020 05/31/11   [provider]  erythromycin ophthalmic ointment Place 1 application into both eyes at bedtime. Apply a thin layer into both eyes Patient not taking: Reported on 05/21/2020 01/26/19   [provider]  Flaxseed, Linseed, (FLAX SEED OIL PO) Take 1,200 mg by mouth daily. Patient not taking: Reported on 05/21/2020    [provider]  OVER THE COUNTER MEDICATION Take 2,688 mcg by mouth daily. DE Dry Eye Omega 2688 mcg Supplement Patient not taking: Reported on 05/21/2020    [provider]    Allergies    Patient has no known allergies.  Review of Systems   Review of Systems  Neurological: Positive for weakness and light-headedness.  All other systems reviewed and are negative.   Physical Exam Updated Vital Signs BP (!) 110/43   Pulse (!) 30   Temp (!) 97.4 F (36.3 C) (Oral)   Resp (!) 28   Ht 5\' 7"  (1.702 m)   Wt 90.7 kg   SpO2 99%   BMI 31.32 kg/m   Physical Exam Vitals and nursing note reviewed.  Constitutional:      Appearance: Normal appearance.  HENT:     Head: Normocephalic and atraumatic.     Right Ear: External ear normal.     Left Ear: External ear normal.     Nose: Nose normal.     Mouth/Throat:     Mouth: Mucous membranes are moist.     Pharynx: Oropharynx is clear.  Eyes:     Extraocular Movements: Extraocular movements intact.     Conjunctiva/sclera: Conjunctivae normal.     Pupils: Pupils are equal, round, and reactive to light.  Cardiovascular:     Rate and Rhythm: Regular rhythm. Bradycardia present.     Pulses: Normal pulses.     Heart sounds: Normal heart sounds.  Pulmonary:     Effort: Pulmonary effort is normal.     Breath sounds: Normal breath sounds.  Abdominal:     General: Abdomen is flat. Bowel sounds are normal.     Palpations: Abdomen is soft.  Musculoskeletal:        General: Normal range of motion.     Cervical  back: Normal range of motion and neck supple.  Skin:    General: Skin is warm.     Capillary Refill: Capillary refill takes less than 2 seconds.  Neurological:     General: No focal deficit present.     Mental Status: He is alert and oriented to person, place, and time.  Psychiatric:        Mood and Affect: Mood normal.        Behavior: Behavior normal.        Thought Content:  Thought content normal.        Judgment: Judgment normal.     ED Results / Procedures / Treatments   Labs (all labs ordered are listed, but only abnormal results are displayed) Labs Reviewed  BASIC METABOLIC PANEL - Abnormal; Notable for the following components:      Result Value   CO2 20 (*)    Glucose, Bld 214 (*)    BUN 27 (*)    Creatinine, Ser 1.67 (*)    Calcium 8.5 (*)    GFR calc non Af Amer 39 (*)    GFR calc Af Amer 46 (*)    All other components within normal limits  SARS CORONAVIRUS 2 BY RT PCR (HOSPITAL ORDER, PERFORMED IN [facility])  CBC  TROPONIN I (HIGH SENSITIVITY)  TROPONIN I (HIGH SENSITIVITY)    EKG EKG Interpretation  Date/Time:  Saturday May 21 2020 13:14:50 EDT Ventricular Rate:  31 PR Interval:    QRS Duration: 83 QT Interval:  554 QTC Calculation: 398 R Axis:   16 Text Interpretation: AV block, complete (third degree) Low voltage, extremity and precordial leads Consider anterior infarct Confirmed by Jacalyn Lefevre [phone]) on 05/21/2020 1:42:47 PM   Radiology DG Chest Port 1 View  Result Date: 05/21/2020 CLINICAL DATA:  Bradycardia.  Hypotension. EXAM: PORTABLE CHEST 1 VIEW COMPARISON:  02/05/2019 FINDINGS: Lungs are adequately inflated with postsurgical changes over the right perihilar region. Mild prominence of the perihilar markings suggesting a degree of vascular congestion. Mild stable cardiomegaly. Remainder of the exam is unchanged. IMPRESSION: Mild stable cardiomegaly with suggestion of minimal vascular congestion. Postsurgical change over the  right perihilar region Electronically Signed   By: Elberta Fortis M.D.   On: 05/21/2020 12:42    Procedures Procedures (including critical care time)  Medications Ordered in ED Medications  atropine 1 MG/10ML injection 0.5 mg (0.5 mg Intravenous Given 05/21/20 1259)  atropine 1 MG/10ML injection 0.5 mg (0.5 mg Intravenous Given 05/21/20 1317)  sodium chloride 0.9 % bolus 500 mL (0 mLs Intravenous Stopped 05/21/20 1445)  sodium chloride 0.9 % bolus 1,000 mL (0 mLs Intravenous Stopped 05/21/20 1445)    ED Course  I have reviewed the triage vital signs and the nursing notes.  Pertinent labs & imaging results that were available during my care of the patient were reviewed by me and considered in my medical decision making (see chart for details).    MDM Rules/Calculators/A&P                          Pt is not on any beta blocking or CCB meds.  He is given a total of 1 mg of atropine without any change in HR.  Monitor looked like CHB, so repeat EKG was done.  This did show Complete Heart Block.  Zoll patches placed on patient.  Pt's bp has remained stable and pt looks good.   Pt d/w Dr. Herbie Baltimore (cards) who feels pt needs a pacemaker.  He will accept him for transfer to [facility].  CRITICAL CARE Performed by: Jacalyn Lefevre   Total critical care time: 45 minutes  Critical care time was exclusive of separately billable procedures and treating other patients.  Critical care was necessary to treat or prevent imminent or life-threatening deterioration.  Critical care was time spent personally by me on the following activities: development of treatment plan with patient and/or surrogate as well as nursing, discussions with consultants, evaluation of patient's response  to treatment, examination of patient, obtaining history from patient or surrogate, ordering and performing treatments and interventions, ordering and review of laboratory studies, ordering and review of radiographic studies, pulse  oximetry and re-evaluation of patient's condition.  Dr. Herbie Baltimore requested an ECHO.  However, we don't have that available at [facility] today.   Final Clinical Impression(s) / ED Diagnoses Final diagnoses:  Complete heart block [facility])    Rx / DC Orders ED Discharge Orders    None       Jacalyn Lefevre, MD 05/21/20 1344    Jacalyn Lefevre, MD 05/21/20 1526

## 2020-05-21 NOTE — H&P (Signed)
Cardiology Admission History and Physical:   Patient ID: Andrew Schultz MRN: [id number]; DOB: May 25, 1946   Admission date: 05/21/2020  Primary Care Provider: Benita Stabile, MD [facility] Cardiologist: No primary care provider on file.  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   History of Present Illness:   Andrew Schultz was in his usual state of good health earlier in the week. This Thursday he was able to be outside, working in the garden without significant SOB or dizziness. Over the last 1-2 days he has noted some increased lightheadedness and has found himself more winded with light activity. He checked his blood pressure at home today and noted his diastolic to be low (40s). This (in conjunction with the aforementioned symptoms) caused him to present to the ER for evaluation.   Upon arrival he was normotensive, but noted to have a HR in the 40s. EKG with CHB and no evidence of ongoing ischemia. Labs with Cr 1.67 (up from 1.22 last year), hsTn 9 -> 10. He was subsequently transferred to [facility] for possible pacemaker placement.   He denies chest pain or chest tightness leading up to his admission. He has not seen or followed with a cardiologist. While he has been outside gardening, he denies tick bites or travel to high risk tick exposure locations. No orthopnea, PND, LE edema. No N/V/D. No fevers.    Past Medical History:  Diagnosis Date  . Apnea, sleep   . CKD (chronic kidney disease)   . Diabetes mellitus without complication ([facility])   . GERD (gastroesophageal reflux disease)   . Hyperkalemia   . Neuropathy   . Obstructive sleep apnea on CPAP   . RA (rheumatoid arthritis) ([facility])   . Renal disorder     Past Surgical History:  Procedure Laterality Date  .  ABDOMINAL AORTOGRAM N/A 02/25/2019   Procedure: ABDOMINAL AORTOGRAM;  Surgeon: Nada Libman, MD;  Location: [facility] INVASIVE CV LAB;  Service: Cardiovascular;  Laterality: N/A;  . ambutation toes    . AMPUTATION Left 09/10/2018   Procedure: AMPUTATION DIGIT LEFT 3RD TOE;  Surgeon: Erskine Emery, DPM;  Location: [facility] OR[facility];  Service: Podiatry;  Laterality: Left;  . CHOLECYSTECTOMY    . 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 LOWER EXTREMITY ANGIOGRAPHY Left 02/25/2019   Procedure: Lower Extremity Angiography;  Surgeon: Nada Libman, MD;  Location: [facility] INVASIVE CV LAB;  Service: Cardiovascular;  Laterality: Left;     Medications Prior to Admission: Prior to Admission medications   Medication Sig Start Date End Date Taking? Authorizing Provider  aspirin EC 81 MG tablet Take 81 mg by mouth daily.   Yes [provider]  carboxymethylcellulose (REFRESH PLUS) 0.5 % SOLN Apply 1 drop to eye 3 (three) times daily as needed (for chronic dry eye).    Yes [provider]  Cholecalciferol 2000 units CAPS Take 2,000 Units by mouth daily.   Yes [provider]  erythromycin ophthalmic ointment Place 1 application into both eyes at bedtime.  01/28/20 01/27/21 Yes [provider]  folic acid (FOLVITE) 400 MCG tablet 400 mcg by Does not apply route daily.    Yes [provider]  HUMALOG 100 UNIT/ML injection 80-85 Units by Pump Prime route See admin instructions. Approximately 80-85  units/24hrs via insulin pump 07/11/15  Yes [provider]  leflunomide (ARAVA) 10 MG tablet Take 10 mg by mouth daily.   Yes [provider]  methotrexate (RHEUMATREX) 2.5 MG tablet Take 10 mg by mouth every Sunday.  07/06/15  Yes [provider]  Multiple Vitamin (MULTIVITAMIN WITH MINERALS) TABS tablet Take 1 tablet by mouth every morning.   Yes [provider]  Omega-3 Fatty Acids (FISH OIL) 1000 MG CAPS by Does not apply route daily.   Yes [provider]  omeprazole (PRILOSEC) 10 MG capsule Take 20 mg by mouth every morning.    Yes [provider]  RESTASIS 0.05 % ophthalmic emulsion Place 1 drop into both eyes 2 (two) times daily.  01/26/19  Yes [provider]  vitamin E 1000 UNIT capsule Take 1,000 Units by mouth daily.   Yes [provider]  alendronate (FOSAMAX) 70 MG tablet Take 1 tablet by mouth every 7 (seven) days. Patient not taking: Reported on 05/21/2020 05/31/11   [provider]  erythromycin ophthalmic ointment Place 1 application into both eyes at bedtime. Apply a thin layer into both eyes Patient not taking: Reported on 05/21/2020 01/26/19   [provider]  Flaxseed, Linseed, (FLAX SEED OIL PO) Take 1,200 mg by mouth daily. Patient not taking: Reported on 05/21/2020    [provider]  OVER THE COUNTER MEDICATION Take 2,688 mcg by mouth daily. DE Dry Eye Omega 2688 mcg Supplement Patient not taking: Reported on 05/21/2020    [provider]     Allergies:   No Known Allergies  Social History:   Social History   Socioeconomic History  . Marital status: Married    Spouse name: Not on file  . Number of children: Not on file  . Years of education: Not on file  . Highest education level: Not on file  Occupational History  . Not on file  Tobacco Use  . Smoking status: Never Smoker  . Smokeless tobacco: Never Used  Vaping Use  . Vaping Use: Never used  Substance and Sexual Activity  . Alcohol use: No  . Drug use: No  . Sexual activity: Not on file  Other Topics Concern  . Not on file  Social History Narrative  . Not on file   Social Determinants of Health   Financial Resource Strain:   . Difficulty of Paying Living Expenses:   Food Insecurity:   . Worried About Programme researcher, broadcasting/film/video in the Last Year:   . Barista in the Last Year:   Transportation Needs:   . Freight forwarder (Medical):   Marland Kitchen Lack of Transportation (Non-Medical):     Physical Activity:   . Days of Exercise per Week:   . Minutes of Exercise per Session:   Stress:   . Feeling of Stress :   Social Connections:   . Frequency of Communication with Friends and Family:   . Frequency of Social Gatherings with Friends and Family:   . Attends Religious Services:   . Active Member of Clubs or Organizations:   . Attends Banker Meetings:   Marland Kitchen Marital Status:   Intimate Partner Violence:   . Fear of Current or Ex-Partner:   . Emotionally Abused:   Marland Kitchen Physically Abused:   . Sexually Abused:     Family History:   The patient's family history is not on file.    ROS:  Please see the history of present  illness.  All other ROS reviewed and negative.     Physical Exam/Data:   Vitals:   05/21/20 1509 05/21/20 1524 05/21/20 1539 05/21/20 1649  BP:    (!) 126/51  Pulse: (!) 31 (!) 31 (!) 30 (!) 34  Resp: 14 12 14 14   Temp:    97.9 F (36.6 C)  TempSrc:    Oral  SpO2: 99% 99% 98% 95%  Weight:      Height:       No intake or output data in the 24 hours ending 05/21/20 1722 Last 3 Weights 05/21/2020 02/25/2019 02/19/2019  Weight (lbs) 200 lb 200 lb 199 lb  Weight (kg) 90.719 kg 90.719 kg 90.266 kg     Body mass index is 31.32 kg/m.  General:  Well nourished, well developed, in no acute distress HEENT: normal Neck: no JVD Cardiac:  Bradycardic S1, S2; RRR; no murmur  Lungs:  clear to auscultation bilaterally, no wheezing, rhonchi or rales  Abd: soft, nontender, no hepatomegaly  Ext: s/p LLE amputation. RLE with healed ulcerations, toe amputations.  Skin: warm and dry  Psych:  Normal affect   EKG:  The ECG that was done demonstrates complete AV dissociation (CHB) with no evidence of ischemia.   Relevant CV Studies: TTE ordered.   Laboratory Data:  High Sensitivity Troponin:   Recent Labs  Lab 05/21/20 1218 05/21/20 1447  TROPONINIHS 10 9      Chemistry Recent Labs  Lab 05/21/20 1218  NA 136  K 4.4  CL 106  CO2 20*   GLUCOSE 214*  BUN 27*  CREATININE 1.67*  CALCIUM 8.5*  GFRNONAA 39*  GFRAA 46*  ANIONGAP 10    No results for input(s): PROT, ALBUMIN, AST, ALT, ALKPHOS, BILITOT in the last 168 hours. Hematology Recent Labs  Lab 05/21/20 1218  WBC 5.9  RBC 4.57  HGB 14.5  HCT 44.1  MCV 96.5  MCH 31.7  MCHC 32.9  RDW 15.0  PLT 153   BNPNo results for input(s): BNP, PROBNP in the last 168 hours.  DDimer No results for input(s): DDIMER in the last 168 hours.   Radiology/Studies:  DG Chest Port 1 View  Result Date: 05/21/2020 CLINICAL DATA:  Bradycardia.  Hypotension. EXAM: PORTABLE CHEST 1 VIEW COMPARISON:  02/05/2019 FINDINGS: Lungs are adequately inflated with postsurgical changes over the right perihilar region. Mild prominence of the perihilar markings suggesting a degree of vascular congestion. Mild stable cardiomegaly. Remainder of the exam is unchanged. IMPRESSION: Mild stable cardiomegaly with suggestion of minimal vascular congestion. Postsurgical change over the right perihilar region Electronically Signed   By: Marin Olp M.D.   On: 05/21/2020 12:42    Assessment and Plan:   1. Complete Heart Block -- Patient asymptomatic and hemodynamically stable. No suggestion of antecedent ischemia, though high risk of CAD given PAD and DM history. No other exposures.  -- Complete TTE ordered.  -- Avoid all nodal agents -- Heparin SQ for DVT ppx to be discontinued Sunday PM -- NPO @ [facility] Sunday night for anticipated PPM on Monday -- Will check lipids, a1c to risk stratify.   2. Type I Diabetes; Insulin Pump 3. Hypoglycemia -- Patient hypoglycemic to 40s on arrival though asymptomatic. Will confirm with serum glucose.  -- Patient requesting to use his own insulin pump supplies -- FSG qAC and qHS ordered -- Consult to diabetes coordinator placed.  -- Will check HbA1c -- Continue home ASA 81 mg daily.   4. Rheumatoid Arthritis -- No  evidence of active disease currently -- On daily  folic acid  -- Written for qWeekly MTX  Severity of Illness: The appropriate patient status for this patient is INPATIENT. Inpatient status is judged to be reasonable and necessary in order to provide the required intensity of service to ensure the patient's safety. The patient's presenting symptoms, physical exam findings, and initial radiographic and laboratory data in the context of their chronic comorbidities is felt to place them at high risk for further clinical deterioration. Furthermore, it is not anticipated that the patient will be medically stable for discharge from the hospital within 2 midnights of admission. The following factors support the patient status of inpatient.   " The patient's presenting symptoms include lightheadedness.. " The worrisome physical exam findings include n/a " The initial radiographic and laboratory data are worrisome because of complete heart block " The chronic co-morbidities include diabetes, immunosuppression.    * I certify that at the point of admission it is my clinical judgment that the patient will require inpatient hospital care spanning beyond 2 midnights from the point of admission due to high intensity of service, high risk for further deterioration and high frequency of surveillance required.*    For questions or updates, please contact [facility] Please consult [email or web address] for contact info under     Signed, Laurell Josephs, MD  05/21/2020 5:22 PM

## 2020-05-21 NOTE — Progress Notes (Signed)
MD Leotis Shames Adan Sis called back and stated as long as pt is not symptomatic, she is not worried. Plan for [facility] Monday. Vitals stable, pt breathing even and unlabored in RA. Will continue to monitor.

## 2020-05-22 ENCOUNTER — Inpatient Hospital Stay (HOSPITAL_COMMUNITY): Payer: Medicare Other

## 2020-05-22 DIAGNOSIS — I442 Atrioventricular block, complete: Principal | ICD-10-CM

## 2020-05-22 LAB — LIPID PANEL
Cholesterol: 102 mg/dL (ref 0–200)
HDL: 31 mg/dL — ABNORMAL LOW (ref >40)
LDL Cholesterol: 56 mg/dL (ref 0–99)
Total CHOL/HDL Ratio: 3.3 RATIO
Triglycerides: 74 mg/dL (ref <150)
VLDL: 15 mg/dL (ref 0–40)

## 2020-05-22 LAB — GLUCOSE, CAPILLARY
Glucose-Capillary: 73 mg/dL (ref 70–99)
Glucose-Capillary: 99 mg/dL (ref 70–99)
Glucose-Capillary: 228 mg/dL — ABNORMAL HIGH (ref 70–99)
Glucose-Capillary: 179 mg/dL — ABNORMAL HIGH (ref 70–99)
Glucose-Capillary: 112 mg/dL — ABNORMAL HIGH (ref 70–99)

## 2020-05-22 LAB — BASIC METABOLIC PANEL
Anion gap: 7 (ref 5–15)
BUN: 21 mg/dL (ref 8–23)
CO2: 23 mmol/L (ref 22–32)
Calcium: 8.4 mg/dL — ABNORMAL LOW (ref 8.9–10.3)
Chloride: 107 mmol/L (ref 98–111)
Creatinine, Ser: 1.64 mg/dL — ABNORMAL HIGH (ref 0.61–1.24)
GFR calc Af Amer: 47 mL/min — ABNORMAL LOW (ref >60)
GFR calc non Af Amer: 40 mL/min — ABNORMAL LOW (ref >60)
Glucose, Bld: 79 mg/dL (ref 70–99)
Potassium: 4.3 mmol/L (ref 3.5–5.1)
Sodium: 137 mmol/L (ref 135–145)

## 2020-05-22 LAB — ECHOCARDIOGRAM COMPLETE
Height: 67 in
Weight: 3199.32 oz

## 2020-05-22 LAB — SURGICAL PCR SCREEN
MRSA, PCR: NEGATIVE
Staphylococcus aureus: NEGATIVE

## 2020-05-22 MED ORDER — ONDANSETRON HCL 4 MG/2ML IJ SOLN
4.0000 mg | Freq: Four times a day (QID) | INTRAMUSCULAR | Status: DC | PRN
  Administered 2020-05-22: 4 mg via INTRAVENOUS
  Filled 2020-05-22: qty 2

## 2020-05-22 MED ORDER — CHLORHEXIDINE GLUCONATE 4 % EX LIQD
60.0000 mL | Freq: Once | CUTANEOUS | Status: AC
  Administered 2020-05-23: 4 via TOPICAL

## 2020-05-22 MED ORDER — CHLORHEXIDINE GLUCONATE CLOTH 2 % EX PADS
6.0000 | MEDICATED_PAD | Freq: Every day | CUTANEOUS | Status: DC
  Administered 2020-05-22: 6 via TOPICAL

## 2020-05-22 MED ORDER — SODIUM CHLORIDE 0.45 % IV SOLN: INTRAVENOUS | Status: DC

## 2020-05-22 MED ORDER — PERFLUTREN LIPID MICROSPHERE
1.0000 mL | INTRAVENOUS | Status: AC | PRN
  Administered 2020-05-22: 2 mL via INTRAVENOUS
  Filled 2020-05-22: qty 10

## 2020-05-22 MED ORDER — DOPAMINE-DEXTROSE 3.2-5 MG/ML-% IV SOLN
0.0000 ug/kg/min | INTRAVENOUS | Status: DC
  Administered 2020-05-22: 5 ug/kg/min via INTRAVENOUS
  Filled 2020-05-22 (×2): qty 250

## 2020-05-22 MED ORDER — CEFAZOLIN SODIUM-DEXTROSE 2-4 GM/100ML-% IV SOLN
2.0000 g | INTRAVENOUS | Status: AC
  Administered 2020-05-23: 2 g via INTRAVENOUS

## 2020-05-22 MED ORDER — SODIUM CHLORIDE 0.9 % IV SOLN: INTRAVENOUS | Status: DC

## 2020-05-22 MED ORDER — CHLORHEXIDINE GLUCONATE 4 % EX LIQD
60.0000 mL | Freq: Once | CUTANEOUS | Status: AC
  Administered 2020-05-22: 4 via TOPICAL
  Filled 2020-05-22: qty 120

## 2020-05-22 MED ORDER — ONDANSETRON HCL 4 MG/2ML IJ SOLN
4.0000 mg | Freq: Four times a day (QID) | INTRAMUSCULAR | Status: DC | PRN
  Administered 2020-05-22: 4 mg via INTRAMUSCULAR
  Filled 2020-05-22: qty 2

## 2020-05-22 MED ORDER — SODIUM CHLORIDE 0.9 % IV SOLN
80.0000 mg | INTRAVENOUS | Status: AC
  Administered 2020-05-23: 80 mg
  Filled 2020-05-22: qty 2

## 2020-05-22 NOTE — Progress Notes (Signed)
  Echocardiogram 2D Echocardiogram with contrast has been performed.  Andrew Schultz F 05/22/2020, 1:40 PM

## 2020-05-22 NOTE — Significant Event (Signed)
Rapid Response Event Note  Overview: Time Called: 1133 Arrival Time: 1135 Event Type: Cardiac Pt bradycardic. HR as low as 29 bpm. Sustaining from 30-35bpm.   Initial Focused Assessment: Pt lying in bed, awake. Alert and oriented. He is able to follow commands and moves extremities appropriately. Skin is warm, dry to touch. Skin color is pale. Pt states he feels weak. Pt was admitted for complete heart block. He informs me that he feels worse than yesterday. Pulses are 2+ palpable in bilateral wrists. Pt denies pain or abdominal discomfort. Abdomen is soft.   VS: BP 129/48, HR 30, RR 14, SpO2 94% on room air  Upon initiating dopamine gtt pt tolerated well. HR came up to 60-70 bpm and pt suddenly felt flushed. Dopamine gtt was paused and resumed at half dose and pt began feeling better. HR now sustaining 30-40 bpm. Dr. Johney Frame at bedside to evaluate pt and updated. MD okay with HR of 30-40 while on gtt as long as pt is asymptomatic and BP is stable.   VS: BP 132/36, HR 39, RR 18, SpO2 93% on 2LNC  Interventions: -Dopamine gtt  Plan of Care (if not transferred): -Continue dopamine gtt -Q1H BP while on dopamine gtt to ensure pt does not become hypertensive -Bedrest -Plan for pacemaker implantation tomorrow   Call rapid response for additional needs  Event Summary: Name of Physician Notified: C. Fransico Michael, PA at 1150 (Notified by primary RN) Outcome: Stayed in room and stabalized Event End Time: 1305  Jennye Moccasin

## 2020-05-22 NOTE — Consult Note (Signed)
ELECTROPHYSIOLOGY CONSULT NOTE    Primary Care Physician: Celene Squibb, MD Referring Physician:  Dr Marletta Lor  Admit Date: 05/21/2020  Reason for consultation:  AV block  Andrew Schultz is a 74 y.o. male with a h/o CRI, DM, OSA on CPAP and rheumatoid arthritis.  He is admitted on transfer from [facility] with complete heart block.   EP is consulted for consideration of a pacemaker.  He is on no rate controlling medicines at baseline. He reports symptoms of fatigue and decreased exercise tolerance.  Today, he denies symptoms of palpitations, chest pain,  lower extremity edema, dizziness, presyncope, syncope, or neurologic sequela. The patient is tolerating medications without difficulties and is otherwise without complaint today.   Past Medical History:  Diagnosis Date  . Apnea, sleep   . CKD (chronic kidney disease)   . Diabetes mellitus without complication ([place])   . GERD (gastroesophageal reflux disease)   . Hyperkalemia   . Neuropathy   . Obstructive sleep apnea on CPAP   . RA (rheumatoid arthritis) ([place])   . Renal disorder    Past Surgical History:  Procedure Laterality Date  . ABDOMINAL AORTOGRAM N/A 02/25/2019   Procedure: ABDOMINAL AORTOGRAM;  Surgeon: Serafina Mitchell, MD;  Location: [place] [facility];  Service: Cardiovascular;  Laterality: N/A;  . ambutation toes    . AMPUTATION Left 09/10/2018   Procedure: AMPUTATION DIGIT LEFT 3RD TOE;  Surgeon: Tyson Babinski, DPM;  Location: [facility];  Service: Podiatry;  Laterality: Left;  . CHOLECYSTECTOMY    . 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 LOWER EXTREMITY ANGIOGRAPHY Left 02/25/2019   Procedure: Lower Extremity Angiography;  Surgeon: Serafina Mitchell, MD;  Location: [place] [facility];  Service: Cardiovascular;  Laterality: Left;    . aspirin EC  81 mg Oral Daily  . Chlorhexidine Gluconate Cloth  6 each Topical Daily  . cholecalciferol  2,000 Units Oral Daily  . folic acid  267 mcg Oral Daily  . heparin  5,000 Units  Subcutaneous Q8H  . insulin pump   Subcutaneous TID WC, HS, 0200  . leflunomide  10 mg Oral Daily  . methotrexate  10 mg Oral Q Sun  . multivitamin with minerals  1 tablet Oral q morning - 10a  . pantoprazole  40 mg Oral Daily  . vitamin E  400 Units Oral Daily   . DOPamine 2.5 mcg/kg/min (05/22/20 1256)    No Known Allergies  Social History   Socioeconomic History  . Marital status: Married    Spouse name: Not on file  . Number of children: Not on file  . Years of education: Not on file  . Highest education level: Not on file  Occupational History  . Not on file  Tobacco Use  . Smoking status: Never Smoker  . Smokeless tobacco: Never Used  Vaping Use  . Vaping Use: Never used  Substance and Sexual Activity  . Alcohol use: No  . Drug use: No  . Sexual activity: Not on file  Other Topics Concern  . Not on file  Social History Narrative  . Not on file   Social Determinants of Health   Financial Resource Strain:   . Difficulty of Paying Living Expenses:   Food Insecurity:   . Worried About Charity fundraiser in the Last Year:   . Arboriculturist in the Last Year:   Transportation Needs:   . Film/video editor (Medical):   [place] Kitchen Lack of Transportation (  Non-Medical):   Physical Activity:   . Days of Exercise per Week:   . Minutes of Exercise per Session:   Stress:   . Feeling of Stress :   Social Connections:   . Frequency of Communication with Friends and Family:   . Frequency of Social Gatherings with Friends and Family:   . Attends Religious Services:   . Active Member of Clubs or Organizations:   . Attends Banker Meetings:   Mar[place] Kitchen Marital Status:   Intimate Partner Violence:   . Fear of Current or Ex-Partner:   . Emotionally Abused:   [place] Kitchen Physically Abused:   . Sexually Abused:     History reviewed. No pertinent family history.  ROS- All systems are reviewed and negative except as per the HPI above  Physical Exam: Telemetry:  Sinus with  complete heart block Vitals:   05/22/20 1245 05/22/20 1250 05/22/20 1255 05/22/20 1300  BP: (!) 109/48  (!) 142/46 (!) 132/36  Pulse: (!) 30 (!) 31 (!) 56 (!) 41  Resp: (!) 21 18 16 18   Temp:      TempSrc:      SpO2: 97% 94% 96% 93%  Weight:      Height:        GEN- The patient is well appearing, alert and oriented x 3 today.   Head- normocephalic, atraumatic Eyes-  Sclera clear, conjunctiva pink Ears- hearing intact Oropharynx- clear Neck- supple,  Lungs-   normal work of breathing Heart- bradycardic rhythm  GI- soft, NT, ND, + BS Extremities- no clubbing, cyanosis, or edema MS- no significant deformity or atrophy Skin- no rash or lesion Psych- euthymic mood, full affect Neuro- strength and sensation are intact  EKG- junctional rhythm with narrow qrs He has had sinus with complete heart block by ekg also   Labs:   Lab Results  Component Value Date   WBC 6.2 05/21/2020   HGB 13.6 05/21/2020   HCT 42.3 05/21/2020   MCV 96.6 05/21/2020   PLT 144 (L) 05/21/2020    Recent Labs  Lab 05/22/20 0153  NA 137  K 4.3  CL 107  CO2 23  BUN 21  CREATININE 1.64*  CALCIUM 8.4*  GLUCOSE 79      Echo: pending  ASSESSMENT AND PLAN:   1. Complete heart block The patient presents on transfer from [facility] with complete heart block.  No reversible causes are found. The patient has symptomatic bradycardia.  I would therefore recommend pacemaker implantation at this time.  Risks, benefits, alternatives to pacemaker implantation were discussed in detail with the patient today. The patient understands that the risks include but are not limited to bleeding, infection, pneumothorax, perforation, tamponade, vascular damage, renal failure, MI, stroke, death,  and lead dislodgement and wishes to proceed. We will therefore schedule the procedure at the next available time.  He has eat lunch today.  We will plan to perform with Dr [facility] tomorrow. Orders placed.      Ladona Ridgel,  MD 05/22/2020  1:12 PM

## 2020-05-22 NOTE — Progress Notes (Signed)
Received call from CCMD stating Pt's HR dropped to 28, when entered the room to check on patient, HR was sustaining at 29. Pt resting in bed asymptomatic, AO x4,  breathing even and unlabored in 2l o2 via [place], stated feels the same as earlier. Bumped Dopamine drip to 27mcg/kg/min , educated pt if he feels flushed or any different to call me. Pt voiced understanding. Bp 127/49, BP monitoring set for every 30 min. Hr now 30-40.Call bell within reach. Charge nurse aware. Will continue to monitor.

## 2020-05-23 ENCOUNTER — Inpatient Hospital Stay (HOSPITAL_COMMUNITY)
Admission: AD | Disposition: A | Discharge status: Home / Self Care | Payer: Self-pay | Source: Home / Self Care | Attending: Cardiology

## 2020-05-23 ENCOUNTER — Other Ambulatory Visit: Payer: Self-pay

## 2020-05-23 HISTORY — PX: PACEMAKER IMPLANT: EP1218

## 2020-05-23 LAB — BASIC METABOLIC PANEL
Anion gap: 9 (ref 5–15)
BUN: 30 mg/dL — ABNORMAL HIGH (ref 8–23)
CO2: 23 mmol/L (ref 22–32)
Calcium: 9.2 mg/dL (ref 8.9–10.3)
Chloride: 105 mmol/L (ref 98–111)
Creatinine, Ser: 1.92 mg/dL — ABNORMAL HIGH (ref 0.61–1.24)
GFR calc Af Amer: 39 mL/min — ABNORMAL LOW (ref >60)
GFR calc non Af Amer: 33 mL/min — ABNORMAL LOW (ref >60)
Glucose, Bld: 212 mg/dL — ABNORMAL HIGH (ref 70–99)
Potassium: 4.9 mmol/L (ref 3.5–5.1)
Sodium: 137 mmol/L (ref 135–145)

## 2020-05-23 LAB — GLUCOSE, CAPILLARY
Glucose-Capillary: 41 mg/dL — CL (ref 70–99)
Glucose-Capillary: 130 mg/dL — ABNORMAL HIGH (ref 70–99)
Glucose-Capillary: 142 mg/dL — ABNORMAL HIGH (ref 70–99)
Glucose-Capillary: 152 mg/dL — ABNORMAL HIGH (ref 70–99)
Glucose-Capillary: 205 mg/dL — ABNORMAL HIGH (ref 70–99)

## 2020-05-23 SURGERY — PACEMAKER IMPLANT
Anesthesia: LOCAL

## 2020-05-23 MED ORDER — CEFAZOLIN SODIUM-DEXTROSE 2-4 GM/100ML-% IV SOLN
INTRAVENOUS | Status: AC
  Filled 2020-05-23: qty 100

## 2020-05-23 MED ORDER — LIDOCAINE HCL 1 % IJ SOLN
INTRAMUSCULAR | Status: AC
  Filled 2020-05-23: qty 60

## 2020-05-23 MED ORDER — FENTANYL CITRATE (PF) 100 MCG/2ML IJ SOLN
INTRAMUSCULAR | Status: AC
  Filled 2020-05-23: qty 2

## 2020-05-23 MED ORDER — HEPARIN (PORCINE) IN NACL 1000-0.9 UT/500ML-% IV SOLN
INTRAVENOUS | Status: AC
  Filled 2020-05-23: qty 500

## 2020-05-23 MED ORDER — IOHEXOL 350 MG/ML SOLN
INTRAVENOUS | Status: DC | PRN
  Administered 2020-05-23: 10 mL

## 2020-05-23 MED ORDER — HEPARIN (PORCINE) IN NACL 1000-0.9 UT/500ML-% IV SOLN
INTRAVENOUS | Status: DC | PRN
  Administered 2020-05-23: 500 mL

## 2020-05-23 MED ORDER — FENTANYL CITRATE (PF) 100 MCG/2ML IJ SOLN
INTRAMUSCULAR | Status: DC | PRN
  Administered 2020-05-23 (×2): 12.5 ug via INTRAVENOUS

## 2020-05-23 MED ORDER — MIDAZOLAM HCL 5 MG/5ML IJ SOLN
INTRAMUSCULAR | Status: DC | PRN
  Administered 2020-05-23 (×2): 1 mg via INTRAVENOUS

## 2020-05-23 MED ORDER — SODIUM CHLORIDE 0.9 % IV SOLN
INTRAVENOUS | Status: AC
  Filled 2020-05-23: qty 2

## 2020-05-23 MED ORDER — LIDOCAINE HCL (PF) 1 % IJ SOLN
INTRAMUSCULAR | Status: DC | PRN
  Administered 2020-05-23: 60 mL

## 2020-05-23 MED ORDER — MIDAZOLAM HCL 5 MG/5ML IJ SOLN
INTRAMUSCULAR | Status: AC
  Filled 2020-05-23: qty 5

## 2020-05-23 SURGICAL SUPPLY — 12 items

## 2020-05-23 NOTE — Progress Notes (Signed)
Pt's BP 158/54, resting in bed asymptomatic, HR 30-40's. Decreased dopamine drip to 61mcg/kg/min. Call bell within reach. Pt AO x4. Radip response aware. Charge nurse aware. Will continue to monitor.

## 2020-05-23 NOTE — Discharge Summary (Addendum)
ELECTROPHYSIOLOGY PROCEDURE DISCHARGE SUMMARY    Patient ID: Andrew Schultz,  MRN: [id number], DOB/AGE: October 02, 1946 74 y.o.  Admit date: 05/21/2020 Discharge date: 05/22/2020  Primary Care Physician: Celene Squibb, MD  Primary Cardiologist: new to [facility] Electrophysiologist: Janell Quiet, Dr. Lovena Le  Primary Discharge Diagnosis:  1. CHB 2. Symptomatic bradycardia  Secondary Discharge Diagnosis:  1. CKD (III) 2. DM      W/insulin pump 3. OSA w/CPAP 4. PVD w/ R BKA 5. RA    No Known Allergies   Procedures This Admission:  1.  Implantation of a MDT dual chamber PPM on 05/24/2020 by Dr Lovena Le.  The patient received a Medtronic (serial number [id number]) pacemaker, Medtronic (serial number [id number]) right atrial lead and a medtronic (serial number [id number]) right ventricular lead There were no immediate post procedure complications. 2.  CXR on 05/24/2020 demonstrated no pneumothorax status post device implantation.   Brief HPI: Andrew Schultz is a 74 y.o. male with PMHx of CRI, DM, OSA on CPAP, PVD with RLE BKA, and rheumatoid arthritis, initially sought attention at [facility] with complaints of lightheadedness, and reduced exertional capacity, previously able to work outdoors without difficulty.  He was noted in CHB w/HR 40's and transferred to [facility] for further care and management.   Hospital Course:  The patient was admitted labs were largely unremarkable, no reversible causes for his CHB were found, EP was consulted and planned for PPM.  TTE noted LVEF 70-75%, mod LA and RA enlargement, aortic sclerosis.  He did develop further bradycardia transiently to high 20's and largely the 30's requiring dopamine that did stabalize his HR and underwent implantation of a PPM with details as outlined above.  He was monitored on telemetry he had a couple brief episodes of AFlutter with SVR prior to his PPM implant.  Dr. Lovena Le discussed this with the patient, suspect perhaps driven by increased  adrenalin with CHB.  No a/c for now, should he have more (easily monitored for via his device now) we will revisit a/c.  Left chest was without hematoma or ecchymosis.  The device was interrogated and found to be functioning normally.  CXR was obtained and demonstrated no pneumothorax status post device implantation.   His lungs are clear on exam, no SOB, cough.  WIth restored HR and AV conduction and ambulation, mild effusions/atalecatis should clear.   Wound care, arm mobility, and restrictions were reviewed with the patient.  The patient feels well, denies any CP or SOB, he was examined by Dr. Lovena Le and considered stable for discharge to home.    Physical Exam: Vitals:   05/23/20 2114 05/23/20 2344 05/24/20 0259 05/24/20 0754  BP:  (!) 143/79 (!) 125/59 133/64  Pulse: 75 70 66 61  Resp: 20 19 17 20   Temp:  98.3 F (36.8 C) 98.1 F (36.7 C) 97.9 F (36.6 C)  TempSrc:  Oral Oral Oral  SpO2: 94% 94% 97% 90%  Weight:      Height:         GEN- The patient is well appearing, alert and oriented x 3 today.   HEENT: normocephalic, atraumatic; sclera clear, conjunctiva pink; hearing intact; oropharynx clear; neck supple, no JVP Lungs- CTA b/l, normal work of breathing.  No wheezes, rales, rhonchi Heart- RRR, no murmurs, rubs or gallops, PMI not laterally displaced GI- soft, non-tender, non-distended Extremities- no clubbing, cyanosis, trace ankle edema, left, chronic looking skin changes, known amp toes MS- R BKA  Skin- warm and dry, no rash  or lesion, left chest without hematoma/ecchymosis Psych- euthymic mood, full affect Neuro- no gross deficits   Labs:   Lab Results  Component Value Date   WBC 6.2 05/21/2020   HGB 13.6 05/21/2020   HCT 42.3 05/21/2020   MCV 96.6 05/21/2020   PLT 144 (L) 05/21/2020    Recent Labs  Lab 05/24/20 0347  NA 138  K 4.3  CL 107  CO2 23  BUN 26*  CREATININE 1.42*  CALCIUM 8.5*  GLUCOSE 107*    Discharge Medications:  Allergies as of  05/24/2020   No Known Allergies      Medication List     TAKE these medications    aspirin EC 81 MG tablet Take 81 mg by mouth daily.   carboxymethylcellulose 0.5 % Soln Commonly known as: REFRESH PLUS Apply 1 drop to eye 3 (three) times daily as needed (for chronic dry eye).   Cholecalciferol 50 MCG (2000 UT) Caps Take 2,000 Units by mouth daily.   erythromycin ophthalmic ointment Place 1 application into both eyes at bedtime.   Fish Oil 1000 MG Caps by Does not apply route daily.   folic acid 400 MCG tablet Commonly known as: FOLVITE 400 mcg by Does not apply route daily.   HumaLOG 100 UNIT/ML injection Generic drug: insulin lispro 80-85 Units by Pump Prime route See admin instructions. Approximately 80-85 units/24hrs via insulin pump   leflunomide 10 MG tablet Commonly known as: ARAVA Take 10 mg by mouth daily.   methotrexate 2.5 MG tablet Commonly known as: RHEUMATREX Take 10 mg by mouth every Sunday.   multivitamin with minerals Tabs tablet Take 1 tablet by mouth every morning.   omeprazole 10 MG capsule Commonly known as: PRILOSEC Take 20 mg by mouth every morning.   Restasis 0.05 % ophthalmic emulsion Generic drug: cycloSPORINE Place 1 drop into both eyes 2 (two) times daily.   vitamin E 1000 UNIT capsule Take 1,000 Units by mouth daily.        Disposition: Home Discharge Instructions     Diet - low sodium heart healthy   Complete by: As directed    Increase activity slowly   Complete by: As directed        Follow-up Information     [facility] Office Follow up.   Specialty: Cardiology Why: 06/02/2020 @ 4:00PM, wound check visit Contact information: [address], [address] [place] [address] [phone]        Marinus Maw, MD Follow up.   Specialty: Cardiology Why: 09/14/2020 @ 10:00AM Contact information: [address]   [address] [phone]                  Duration of Discharge Encounter: Greater than 30 minutes including physician time.  Norma Fredrickson, PA-C 05/24/2020 10:49 AM  EP attending  Patient seen and examined. Agree with the findings as noted above. The patient is doing well after DDD PM insertion. His PPM interrogation demonstrates normal DDD PM function. CXR demonstrates stable lead position and no PTX. He will be discharged home with usual followup.   Leonia Reeves.D.

## 2020-05-23 NOTE — Progress Notes (Signed)
Pt's rhythm changed to A flutter. EKG obtained. Bp=150/48. Pt asymptomatic. Pt backed to heart block rhytm. PA notified.   Lawson Radar, RN

## 2020-05-23 NOTE — Progress Notes (Addendum)
Progress Note  Patient Name: Andrew Schultz Date of Encounter: 05/23/2020  [facility] Cardiologist: new to [facility]  Subjective   No CP, SOB. Getting tired of laying in  Bed  Inpatient Medications    Scheduled Meds: . aspirin EC  81 mg Oral Daily  . Chlorhexidine Gluconate Cloth  6 each Topical Daily  . cholecalciferol  2,000 Units Oral Daily  . folic acid  500 mcg Oral Daily  . gentamicin irrigation  80 mg Irrigation On Call  . insulin pump   Subcutaneous TID WC, HS, 0200  . leflunomide  10 mg Oral Daily  . methotrexate  10 mg Oral Q Sun  . multivitamin with minerals  1 tablet Oral q morning - 10a  . pantoprazole  40 mg Oral Daily  . vitamin E  400 Units Oral Daily   Continuous Infusions: . sodium chloride    . sodium chloride    .  ceFAZolin (ANCEF) IV    . DOPamine 5 mcg/kg/min (05/23/20 0335)   PRN Meds: acetaminophen, ondansetron, polyvinyl alcohol   Vital Signs    Vitals:   05/23/20 0345 05/23/20 0500 05/23/20 0605 05/23/20 0816  BP: (!) 132/56  (!) 140/49 (!) 140/51  Pulse: (!) 40 (!) 32 (!) 45 (!) 35  Resp: 19 20 18 19   Temp: 97.7 F (36.5 C)   98 F (36.7 C)  TempSrc: Oral   Oral  SpO2: 94% 97% 100% 96%  Weight:      Height:        Intake/Output Summary (Last 24 hours) at 05/23/2020 0847 Last data filed at 05/23/2020 0335 Gross per 24 hour  Intake 223.15 ml  Output 150 ml  Net 73.15 ml   Last 3 Weights 05/21/2020 05/21/2020 02/25/2019  Weight (lbs) 199 lb 15.3 oz 200 lb 200 lb  Weight (kg) 90.7 kg 90.719 kg 90.719 kg      Telemetry    CHB 30's - Personally Reviewed  ECG    No new EKgs - Personally Reviewed  Physical Exam   GEN: No acute distress.   Neck: No JVD Cardiac: irregular, bradycardic, no murmurs, rubs, or gallops.  Respiratory: CTA b/l. GI: Soft, nontender, non-distended  MS: trace LLE edema; R BKA Neuro:  Nonfocal  Psych: Normal affect   Labs    High Sensitivity Troponin:   Recent Labs  Lab 05/21/20 1218  05/21/20 1447  TROPONINIHS 10 9      Chemistry Recent Labs  Lab 05/21/20 1819 05/22/20 0153 05/23/20 0424  NA 140 137 137  K 4.6 4.3 4.9  CL 106 107 105  CO2 26 23 23   GLUCOSE 77 79 212*  BUN 23 21 30*  CREATININE 1.61* 1.64* 1.92*  CALCIUM 8.3* 8.4* 9.2  GFRNONAA 41* 40* 33*  GFRAA 48* 47* 39*  ANIONGAP 8 7 9      Hematology Recent Labs  Lab 05/21/20 1218 05/21/20 1819  WBC 5.9 6.2  RBC 4.57 4.38  HGB 14.5 13.6  HCT 44.1 42.3  MCV 96.5 96.6  MCH 31.7 31.1  MCHC 32.9 32.2  RDW 15.0 15.2  PLT 153 144*    BNPNo results for input(s): BNP, PROBNP in the last 168 hours.   DDimer No results for input(s): DDIMER in the last 168 hours.   Radiology    DG Chest Port 1 View  Result Date: 05/21/2020 CLINICAL DATA:  Bradycardia.  Hypotension. EXAM: PORTABLE CHEST 1 VIEW COMPARISON:  02/05/2019 FINDINGS: Lungs are adequately inflated with postsurgical changes  over the right perihilar region. Mild prominence of the perihilar markings suggesting a degree of vascular congestion. Mild stable cardiomegaly. Remainder of the exam is unchanged. IMPRESSION: Mild stable cardiomegaly with suggestion of minimal vascular congestion. Postsurgical change over the right perihilar region Electronically Signed   By: Daniel  Boyle M.D.   On: 05/21/2020 12:42  ° ° ° °Cardiac Studies  ° °05/22/2020: TTE °IMPRESSIONS  °1. Left ventricular ejection fraction, by estimation, is 70 to 75%. The  °left ventricle has hyperdynamic function. The left ventricle has no  °regional wall motion abnormalities. Left ventricular diastolic parameters  °are indeterminate.  ° 2. Right ventricular systolic function is normal. The right ventricular  °size is normal. Tricuspid regurgitation signal is inadequate for assessing  °PA pressure.  ° 3. Left atrial size was moderately dilated.  ° 4. Right atrial size was moderately dilated.  ° 5. The mitral valve is normal in structure. No evidence of mitral valve  °regurgitation. No  evidence of mitral stenosis.  ° 6. The aortic valve is normal in structure. Aortic valve regurgitation is  °not visualized. Mild to moderate aortic valve sclerosis/calcification is  °present, without any evidence of aortic stenosis.  ° 7. The inferior vena cava is normal in size with greater than 50%  °respiratory variability, suggesting right atrial pressure of 3 mmHg.  ° °Patient Profile  °   °74 y.o. male with a h/o CRI, DM, OSA on CPAP, PVD with RLE BKA, and rheumatoid arthritis, initially sought attention at [facility] with complaints of lightheadedness, and reduced exertional capacity, previously able to work outdoors without difficulty.  He was noted in CHB w/HR 40's and transferred to [facility] for further care and management. ° °Assessment & Plan  °  °1. CHB °    Not felt to be ischemic with no symptoms and neg HS Trop °    No CP °    PPM recommended ° °The patient was seen yesterday by Dr. Allred, discussed recommendation, PPM implant procedure, potential risks and benefits °He has no follow up questions this AM, remains agreeable to PPM implant ° °BP stable, good mentation °Started on dopamine last night for sustained HR 30's, dipping to 20's ° °Dr. Arrielle Mcginn will see later this AM ° °He lives in [place], will plan f/u there ° °2. DM °    Insulin pump °    He is very knowledgeable regarding his DM management ° °3. PVD °    He reports 2/2 DM °    R BKA  ° °4. CRI °    Creat creeping up, 1.92 today  °    Baseline about 1.6 °    Anticipate improvement once bradycardia resolved °     ° °For questions or updates, please contact [facility] °Please consult [email or web address] for contact info under  ° °  °   °Signed, °Renee Lynn Ursuy, PA-C  °05/23/2020, 8:47 AM   ° °EP attending ° °Patient seen and examined.  Agree with findings as noted above.  The patient has reverted to atrial flutter with a slow ventricular response with underlying complete heart block.  I discussed the treatment options and the risks, goals, benefits, and  expectations of pacemaker insertion for underlying complete heart block were discussed and he wishes to proceed.  We will try to pace the patient back to sinus rhythm during his procedure.  He will require initiation of anticoagulation following the pacemaker insertion, although this will likely be done a couple of days after the procedure has completed. ° °Raywood Wailes,   MD

## 2020-05-23 NOTE — Progress Notes (Signed)
Pt coming back from cath lab. Reinitiated tele. VSS. Pt alert. Call bell within reach.   Lawson Radar, RN

## 2020-05-23 NOTE — Progress Notes (Addendum)
Pt's HR 28-30's. AO x4, pt was profusely sweating while sleeping. Stated he feels about the same, no change. Blood sugar at 0315 was 130. Denies SOB, Dizziness, just fatigued as he has been all day. Increased Dopamine drip to 101mcg/kg/min. Frequent Bp check, please see flow sheet for documentation. Call bell within reach. Plan for PPM today. Will continue to monitor.

## 2020-05-23 NOTE — Progress Notes (Signed)
Pt's HR dropped and sustained in 28, Dopamine drip increased to 4 mcg/kg/min. Pt resting in bed, ao x4. Asymptomatic. Will continue to monitor.

## 2020-05-23 NOTE — Plan of Care (Signed)
Poc progressing.  

## 2020-05-23 NOTE — Progress Notes (Addendum)
Inpatient Diabetes Program Recommendations  AACE/ADA: New Consensus Statement on Inpatient Glycemic Control (2015)  Target Ranges:  Prepandial:   less than 140 mg/dL      Peak postprandial:   less than 180 mg/dL (1-2 hours)      Critically ill patients:  140 - 180 mg/dL   Lab Results  Component Value Date   GLUCAP 142 (H) 05/23/2020   HGBA1C 7.8 (H) 05/21/2020    Review of Glycemic Control Results for Andrew Schultz, Andrew Schultz "Schultz" (MRN [id number]) as of 05/23/2020 14:24  Ref. Range 05/22/2020 21:22 05/23/2020 03:16 05/23/2020 11:23  Glucose-Capillary Latest Ref Range: 70 - 99 mg/dL 654 (H) 650 (H) 354 (H)   Diabetes history: Type 1 DM Outpatient Diabetes medications: Humalog 80-85 units QD Current orders for Inpatient glycemic control: insulin pump  Inpatient Diabetes Program Recommendations:   Addendum@1726 : Insulin pump reapplied per patient. Patient is independent and safe for use.  Patient to Echo lab for procedure. Patient's insulin pump disconnect and at bedside with wife. Reached out to RN to page once patient returns for reapplication.  Spoke with wife regarding diabetes and home regimen for diabetes management.  Patient states that he was diagnosed with diabetes at the age of 74 years old and he is followed by Dr Margo Aye for diabetes management. Patient uses a Medtronic insulin pump with Humalog insulin as an outpatient.   Current insulin pump settings are as follows:  Basal insulin  0000 1.45 units/hour 0800 2.50 units/hour 1400 2.10 units/hour 2100 1.75 units/hour Total daily basal insulin: 61.25 units/24 hours  Carb Coverage 0000 1:4 1 unit for every 4 grams of carbohydrates 2100 1:5.5 1 unit for every 5.5 grams of carbohydrates  Insulin Sensitivity 0000 1:25 1 unit drops blood glucose 25 mg/dl 6568 1:27 1 unit drops blood glucose 20 mg/dl 5170 0:17 1 unit drops blood glucose 20 mg/dl 4944 9:67 1 unit drops blood glucose 40 mg/dl  Target Glucose  Goals 0000-0000 100-130 mg/dl    NURSING: Once insulin pump order set is ordered please print off the Patient insulin pump contract and flow sheet. The insulin pump contract should be signed by the patient and then placed in the chart. The patient insulin pump flow sheet will be completed by the patient at the bedside and the RN caring for the patient will use the patient's flow sheet to document in the [facility]. RN will need to complete the Nursing Insulin Pump Flowsheet at least once a shift. Patient will need to keep extra insulin pump supplies at the bedside at all times.   Thanks, Lujean Rave, MSN, RNC-OB Diabetes Coordinator [phone] (8a-5p)

## 2020-05-23 NOTE — Interval H&P Note (Signed)
History and Physical Interval Note:  05/23/2020 2:48 PM  Andrew Schultz  has presented today for surgery, with the diagnosis of CHB.  The various methods of treatment have been discussed with the patient and family. After consideration of risks, benefits and other options for treatment, the patient has consented to  Procedure(s): PACEMAKER IMPLANT (N/A) as a surgical intervention.  The patient's history has been reviewed, patient examined, no change in status, stable for surgery.  I have reviewed the patient's chart and labs.  Questions were answered to the patient's satisfaction.     Lewayne Bunting

## 2020-05-23 NOTE — H&P (View-Only) (Signed)
Progress Note  Patient Name: Andrew Schultz Date of Encounter: 05/23/2020  [facility] Cardiologist: new to [facility]  Subjective   No CP, SOB. Getting tired of laying in  Bed  Inpatient Medications    Scheduled Meds:  aspirin EC  81 mg Oral Daily   Chlorhexidine Gluconate Cloth  6 each Topical Daily   cholecalciferol  2,000 Units Oral Daily   folic acid  500 mcg Oral Daily   gentamicin irrigation  80 mg Irrigation On Call   insulin pump   Subcutaneous TID WC, HS, 0200   leflunomide  10 mg Oral Daily   methotrexate  10 mg Oral Q Sun   multivitamin with minerals  1 tablet Oral q morning - 10a   pantoprazole  40 mg Oral Daily   vitamin E  400 Units Oral Daily   Continuous Infusions:  sodium chloride     sodium chloride      ceFAZolin (ANCEF) IV     DOPamine 5 mcg/kg/min (05/23/20 0335)   PRN Meds: acetaminophen, ondansetron, polyvinyl alcohol   Vital Signs    Vitals:   05/23/20 0345 05/23/20 0500 05/23/20 0605 05/23/20 0816  BP: (!) 132/56  (!) 140/49 (!) 140/51  Pulse: (!) 40 (!) 32 (!) 45 (!) 35  Resp: 19 20 18 19   Temp: 97.7 F (36.5 C)   98 F (36.7 C)  TempSrc: Oral   Oral  SpO2: 94% 97% 100% 96%  Weight:      Height:        Intake/Output Summary (Last 24 hours) at 05/23/2020 0847 Last data filed at 05/23/2020 0335 Gross per 24 hour  Intake 223.15 ml  Output 150 ml  Net 73.15 ml   Last 3 Weights 05/21/2020 05/21/2020 02/25/2019  Weight (lbs) 199 lb 15.3 oz 200 lb 200 lb  Weight (kg) 90.7 kg 90.719 kg 90.719 kg      Telemetry    CHB 30's - Personally Reviewed  ECG    No new EKgs - Personally Reviewed  Physical Exam   GEN: No acute distress.   Neck: No JVD Cardiac: irregular, bradycardic, no murmurs, rubs, or gallops.  Respiratory: CTA b/l. GI: Soft, nontender, non-distended  MS: trace LLE edema; R BKA Neuro:  Nonfocal  Psych: Normal affect   Labs    High Sensitivity Troponin:   Recent Labs  Lab 05/21/20 1218  05/21/20 1447  TROPONINIHS 10 9      Chemistry Recent Labs  Lab 05/21/20 1819 05/22/20 0153 05/23/20 0424  NA 140 137 137  K 4.6 4.3 4.9  CL 106 107 105  CO2 26 23 23   GLUCOSE 77 79 212*  BUN 23 21 30*  CREATININE 1.61* 1.64* 1.92*  CALCIUM 8.3* 8.4* 9.2  GFRNONAA 41* 40* 33*  GFRAA 48* 47* 39*  ANIONGAP 8 7 9      Hematology Recent Labs  Lab 05/21/20 1218 05/21/20 1819  WBC 5.9 6.2  RBC 4.57 4.38  HGB 14.5 13.6  HCT 44.1 42.3  MCV 96.5 96.6  MCH 31.7 31.1  MCHC 32.9 32.2  RDW 15.0 15.2  PLT 153 144*    BNPNo results for input(s): BNP, PROBNP in the last 168 hours.   DDimer No results for input(s): DDIMER in the last 168 hours.   Radiology    DG Chest Port 1 View  Result Date: 05/21/2020 CLINICAL DATA:  Bradycardia.  Hypotension. EXAM: PORTABLE CHEST 1 VIEW COMPARISON:  02/05/2019 FINDINGS: Lungs are adequately inflated with postsurgical changes  over the right perihilar region. Mild prominence of the perihilar markings suggesting a degree of vascular congestion. Mild stable cardiomegaly. Remainder of the exam is unchanged. IMPRESSION: Mild stable cardiomegaly with suggestion of minimal vascular congestion. Postsurgical change over the right perihilar region Electronically Signed   By: Marin Olp M.D.   On: 05/21/2020 12:42     Cardiac Studies   05/22/2020: TTE IMPRESSIONS  1. Left ventricular ejection fraction, by estimation, is 70 to 75%. The  left ventricle has hyperdynamic function. The left ventricle has no  regional wall motion abnormalities. Left ventricular diastolic parameters  are indeterminate.  2. Right ventricular systolic function is normal. The right ventricular  size is normal. Tricuspid regurgitation signal is inadequate for assessing  PA pressure.  3. Left atrial size was moderately dilated.  4. Right atrial size was moderately dilated.  5. The mitral valve is normal in structure. No evidence of mitral valve  regurgitation. No  evidence of mitral stenosis.  6. The aortic valve is normal in structure. Aortic valve regurgitation is  not visualized. Mild to moderate aortic valve sclerosis/calcification is  present, without any evidence of aortic stenosis.  7. The inferior vena cava is normal in size with greater than 50%  respiratory variability, suggesting right atrial pressure of 3 mmHg.   Patient Profile     74 y.o. male with a h/o CRI, DM, OSA on CPAP, PVD with RLE BKA, and rheumatoid arthritis, initially sought attention at [facility] with complaints of lightheadedness, and reduced exertional capacity, previously able to work outdoors without difficulty.  He was noted in CHB w/HR 40's and transferred to [facility] for further care and management.  Assessment & Plan    1. CHB     Not felt to be ischemic with no symptoms and neg HS Trop     No CP     PPM recommended  The patient was seen yesterday by Dr. Rayann Heman, discussed recommendation, PPM implant procedure, potential risks and benefits He has no follow up questions this AM, remains agreeable to PPM implant  BP stable, good mentation Started on dopamine last night for sustained HR 30's, dipping to 20's  Dr. Lovena Le will see later this AM  He lives in [place], will plan f/u there  2. DM     Insulin pump     He is very knowledgeable regarding his DM management  3. PVD     He reports 2/2 DM     R BKA   4. CRI     Creat creeping up, 1.92 today      Baseline about 1.6     Anticipate improvement once bradycardia resolved       For questions or updates, please contact [facility] Please consult [email or web address] for contact info under        Signed, Baldwin Jamaica, PA-C  05/23/2020, 8:47 AM    EP attending  Patient seen and examined.  Agree with findings as noted above.  The patient has reverted to atrial flutter with a slow ventricular response with underlying complete heart block.  I discussed the treatment options and the risks, goals, benefits, and  expectations of pacemaker insertion for underlying complete heart block were discussed and he wishes to proceed.  We will try to pace the patient back to sinus rhythm during his procedure.  He will require initiation of anticoagulation following the pacemaker insertion, although this will likely be done a couple of days after the procedure has completed.  Cristopher Peru,  MD

## 2020-05-24 ENCOUNTER — Inpatient Hospital Stay (HOSPITAL_COMMUNITY): Payer: Medicare Other

## 2020-05-24 ENCOUNTER — Encounter (HOSPITAL_COMMUNITY): Payer: Self-pay | Admitting: Internal Medicine

## 2020-05-24 LAB — BASIC METABOLIC PANEL
Anion gap: 8 (ref 5–15)
BUN: 26 mg/dL — ABNORMAL HIGH (ref 8–23)
CO2: 23 mmol/L (ref 22–32)
Calcium: 8.5 mg/dL — ABNORMAL LOW (ref 8.9–10.3)
Chloride: 107 mmol/L (ref 98–111)
Creatinine, Ser: 1.42 mg/dL — ABNORMAL HIGH (ref 0.61–1.24)
GFR calc Af Amer: 56 mL/min — ABNORMAL LOW (ref >60)
GFR calc non Af Amer: 48 mL/min — ABNORMAL LOW (ref >60)
Glucose, Bld: 107 mg/dL — ABNORMAL HIGH (ref 70–99)
Potassium: 4.3 mmol/L (ref 3.5–5.1)
Sodium: 138 mmol/L (ref 135–145)

## 2020-05-24 LAB — GLUCOSE, CAPILLARY
Glucose-Capillary: 125 mg/dL — ABNORMAL HIGH (ref 70–99)
Glucose-Capillary: 80 mg/dL (ref 70–99)
Glucose-Capillary: 115 mg/dL — ABNORMAL HIGH (ref 70–99)

## 2020-05-24 MED ORDER — CEFAZOLIN SODIUM-DEXTROSE 1-4 GM/50ML-% IV SOLN
1.0000 g | Freq: Once | INTRAVENOUS | Status: AC
  Administered 2020-05-24: 1 g via INTRAVENOUS
  Filled 2020-05-24: qty 50

## 2020-05-24 MED FILL — Lidocaine HCl Local Inj 1%: INTRAMUSCULAR | Qty: 60 | Status: AC

## 2020-05-24 NOTE — Discharge Instructions (Signed)
    Supplemental Discharge Instructions for  Pacemaker/Defibrillator Patients  Activity No heavy lifting or vigorous activity with your left/right arm for 6 to 8 weeks.  Do not raise your left/right arm above your head for one week.  Gradually raise your affected arm as drawn below.              04/28/2020                  04/29/2020                  04/30/2020                 05/01/2020 __  NO DRIVING does not drive  WOUND CARE - Keep the wound area clean and dry.  Do not get this area wet for one week. No showers for one week; you may shower on 05/01/2020   . - The tape/steri-strips on your wound will fall off; do not pull them off.  No bandage is needed on the site.  DO  NOT apply any creams, oils, or ointments to the wound area. - If you notice any drainage or discharge from the wound, any swelling or bruising at the site, or you develop a fever > 101? F after you are discharged home, call the office at once.  Special Instructions - You are still able to use cellular telephones; use the ear opposite the side where you have your pacemaker/defibrillator.  Avoid carrying your cellular phone near your device. - When traveling through airports, show security personnel your identification card to avoid being screened in the metal detectors.  Ask the security personnel to use the hand wand. - Avoid arc welding equipment, MRI testing (magnetic resonance imaging), TENS units (transcutaneous nerve stimulators).  Call the office for questions about other devices. - Avoid electrical appliances that are in poor condition or are not properly grounded. - Microwave ovens are safe to be near or to operate.

## 2020-05-24 NOTE — Progress Notes (Signed)
D/c tele and IV. Went over AVS with pt and pt's wife and all answers were addressed.   Lawson Radar, RN

## 2020-06-02 ENCOUNTER — Ambulatory Visit (INDEPENDENT_AMBULATORY_CARE_PROVIDER_SITE_OTHER): Payer: Medicare Other | Admitting: Emergency Medicine

## 2020-06-02 ENCOUNTER — Other Ambulatory Visit: Payer: Self-pay

## 2020-06-02 DIAGNOSIS — I442 Atrioventricular block, complete: Secondary | ICD-10-CM | POA: Diagnosis not present

## 2020-06-03 LAB — CUP PACEART INCLINIC DEVICE CHECK
Battery Remaining Longevity: 142 mo
Battery Voltage: 3.22 V
Brady Statistic AP VP Percent: 4 %
Brady Statistic AP VS Percent: 0.25 %
Brady Statistic AS VP Percent: 95.5 %
Brady Statistic AS VS Percent: 0.25 %
Brady Statistic RA Percent Paced: 4.22 %
Brady Statistic RV Percent Paced: 99.5 %
Date Time Interrogation Session: 20210708175648
Implantable Lead Implant Date: 20210628
Implantable Lead Implant Date: 20210628
Implantable Lead Location: 753859
Implantable Lead Location: 753860
Implantable Lead Model: 3830
Implantable Lead Model: 5076
Implantable Pulse Generator Implant Date: 20210628
Lead Channel Impedance Value: 399 Ohm
Lead Channel Impedance Value: 399 Ohm
Lead Channel Impedance Value: 551 Ohm
Lead Channel Impedance Value: 665 Ohm
Lead Channel Pacing Threshold Amplitude: 0.625 V
Lead Channel Pacing Threshold Amplitude: 0.625 V
Lead Channel Pacing Threshold Pulse Width: 0.4 ms
Lead Channel Pacing Threshold Pulse Width: 0.4 ms
Lead Channel Sensing Intrinsic Amplitude: 3.375 mV
Lead Channel Sensing Intrinsic Amplitude: 4.5 mV
Lead Channel Sensing Intrinsic Amplitude: 5.75 mV
Lead Channel Sensing Intrinsic Amplitude: 7.25 mV
Lead Channel Setting Pacing Amplitude: 3.5 V
Lead Channel Setting Pacing Amplitude: 3.5 V
Lead Channel Setting Pacing Pulse Width: 0.4 ms
Lead Channel Setting Sensing Sensitivity: 0.9 mV

## 2020-06-03 NOTE — Progress Notes (Signed)
Wound check appointment. Steri-strips removed. Wound without redness or edema. Incision edges approximated, wound well healed. Normal device function. Thresholds, sensing, and impedances consistent with implant measurements. Device programmed at 3.5V/auto capture programmed on for extra safety margin until 3 month visit. Histogram distribution appropriate for patient and level of activity. No mode switches or high ventricular rates noted. Patient educated about wound care, arm mobility, lifting restrictions. ROV with Dr. Ladona Ridgel on 09/14/20.  Remote transmissions are set up, next scheduled transmission on 08/26/20.

## 2020-08-03 ENCOUNTER — Telehealth: Payer: Self-pay | Admitting: Internal Medicine

## 2020-08-03 NOTE — Telephone Encounter (Signed)
Patient has transmission equipment but doesn't know how to use it.I gave him the number to Device clinic in [place].

## 2020-08-03 NOTE — Telephone Encounter (Signed)
    STAT if HR is under 50 or over 120 (normal HR is 60-100 beats per minute)  1) What is your heart rate? 45  2) Do you have a log of your heart rate readings (document readings)? 121/54   3) Do you have any other symptoms? Pt said he just feeling tired and a little weak but no unusual symptoms

## 2020-08-03 NOTE — Telephone Encounter (Addendum)
Patient called and reassured that transmission showed device function WNL. Presenting EGM was AP/ VS @ 85 bpm. 1 episode of SVT that lasted 5 seconds on 07/14/20 that patient was unaware of.  Patient reports feeling tired but denies SOB, CP, chest pressure, dizziness or syncope. Call made to the office because patient had reading of < 60 bpm at home by his monitor. Reassured pacemaker functioning WNL. Patient has follow-up with Dr Ladona Ridgel 09/14/20 and will call if he has any change in condition or concerns prior to appointment.

## 2020-08-03 NOTE — Telephone Encounter (Signed)
Spoke with patient and walked him through how to send and transmission was sent successfully. Please call patient back he is very concerned

## 2020-08-26 ENCOUNTER — Ambulatory Visit (INDEPENDENT_AMBULATORY_CARE_PROVIDER_SITE_OTHER): Payer: Medicare Other

## 2020-08-26 DIAGNOSIS — I442 Atrioventricular block, complete: Secondary | ICD-10-CM | POA: Diagnosis not present

## 2020-08-28 LAB — CUP PACEART REMOTE DEVICE CHECK
Battery Remaining Longevity: 144 mo
Battery Voltage: 3.2 V
Brady Statistic AP VP Percent: 21.9 %
Brady Statistic AP VS Percent: 1.86 %
Brady Statistic AS VP Percent: 33.21 %
Brady Statistic AS VS Percent: 43.04 %
Brady Statistic RA Percent Paced: 27.95 %
Brady Statistic RV Percent Paced: 55.11 %
Date Time Interrogation Session: 20211001031249
Implantable Lead Implant Date: 20210628
Implantable Lead Implant Date: 20210628
Implantable Lead Location: 753859
Implantable Lead Location: 753860
Implantable Lead Model: 3830
Implantable Lead Model: 5076
Implantable Pulse Generator Implant Date: 20210628
Lead Channel Impedance Value: 323 Ohm
Lead Channel Impedance Value: 437 Ohm
Lead Channel Impedance Value: 570 Ohm
Lead Channel Impedance Value: 665 Ohm
Lead Channel Pacing Threshold Amplitude: 0.5 V
Lead Channel Pacing Threshold Amplitude: 0.75 V
Lead Channel Pacing Threshold Pulse Width: 0.4 ms
Lead Channel Pacing Threshold Pulse Width: 0.4 ms
Lead Channel Sensing Intrinsic Amplitude: 3 mV
Lead Channel Sensing Intrinsic Amplitude: 3 mV
Lead Channel Sensing Intrinsic Amplitude: 8.125 mV
Lead Channel Sensing Intrinsic Amplitude: 8.125 mV
Lead Channel Setting Pacing Amplitude: 3 V
Lead Channel Setting Pacing Amplitude: 3 V
Lead Channel Setting Pacing Pulse Width: 0.4 ms
Lead Channel Setting Sensing Sensitivity: 0.9 mV

## 2020-08-29 NOTE — Progress Notes (Signed)
Remote pacemaker transmission.   

## 2020-09-06 ENCOUNTER — Encounter: Payer: Medicare Other | Admitting: Internal Medicine

## 2020-09-14 ENCOUNTER — Ambulatory Visit (INDEPENDENT_AMBULATORY_CARE_PROVIDER_SITE_OTHER): Payer: Medicare Other | Admitting: Internal Medicine

## 2020-09-14 ENCOUNTER — Encounter: Payer: Self-pay | Admitting: Internal Medicine

## 2020-09-14 ENCOUNTER — Other Ambulatory Visit: Payer: Self-pay

## 2020-09-14 DIAGNOSIS — I442 Atrioventricular block, complete: Secondary | ICD-10-CM

## 2020-09-14 LAB — CUP PACEART INCLINIC DEVICE CHECK
Battery Remaining Longevity: 154 mo
Battery Voltage: 3.2 V
Brady Statistic AP VP Percent: 9.77 %
Brady Statistic AP VS Percent: 1.07 %
Brady Statistic AS VP Percent: 45.48 %
Brady Statistic AS VS Percent: 43.69 %
Brady Statistic RA Percent Paced: 12.62 %
Brady Statistic RV Percent Paced: 55.25 %
Date Time Interrogation Session: 20211020101353
Implantable Lead Implant Date: 20210628
Implantable Lead Implant Date: 20210628
Implantable Lead Location: 753859
Implantable Lead Location: 753860
Implantable Lead Model: 3830
Implantable Lead Model: 5076
Implantable Pulse Generator Implant Date: 20210628
Lead Channel Impedance Value: 323 Ohm
Lead Channel Impedance Value: 399 Ohm
Lead Channel Impedance Value: 551 Ohm
Lead Channel Impedance Value: 722 Ohm
Lead Channel Pacing Threshold Amplitude: 0.75 V
Lead Channel Pacing Threshold Amplitude: 1 V
Lead Channel Pacing Threshold Pulse Width: 0.4 ms
Lead Channel Pacing Threshold Pulse Width: 0.4 ms
Lead Channel Sensing Intrinsic Amplitude: 3.75 mV
Lead Channel Sensing Intrinsic Amplitude: 8.1 mV
Lead Channel Setting Pacing Amplitude: 2.25 V
Lead Channel Setting Pacing Amplitude: 2.5 V
Lead Channel Setting Pacing Pulse Width: 0.4 ms
Lead Channel Setting Sensing Sensitivity: 0.9 mV

## 2020-09-14 NOTE — Patient Instructions (Signed)
Medication Instructions:  Your physician recommends that you continue on your current medications as directed. Please refer to the Current Medication list given to you today.  *If you need a refill on your cardiac medications before your next appointment, please call your pharmacy*   Lab Work: NONE   If you have labs (blood work) drawn today and your tests are completely normal, you will receive your results only by: . MyChart Message (if you have MyChart) OR . A paper copy in the mail If you have any lab test that is abnormal or we need to change your treatment, we will call you to review the results.   Testing/Procedures: NONE    Follow-Up: At [facility], you and your health needs are our priority.  As part of our continuing mission to provide you with exceptional heart care, we have created designated Provider Care Teams.  These Care Teams include your primary Cardiologist (physician) and Advanced Practice Providers (APPs -  Physician Assistants and Nurse Practitioners) who all work together to provide you with the care you need, when you need it.  We recommend signing up for the patient portal called "MyChart".  Sign up information is provided on this After Visit Summary.  MyChart is used to connect with patients for Virtual Visits (Telemedicine).  Patients are able to view lab/test results, encounter notes, upcoming appointments, etc.  Non-urgent messages can be sent to your provider as well.   To learn more about what you can do with MyChart, go to [email or web address].    Your next appointment:   1 year(s)  The format for your next appointment:   In Person  Provider:   Gregg Taylor, MD   Other Instructions Thank you for choosing [facility]!    

## 2020-09-14 NOTE — Progress Notes (Signed)
HPI Andrew Schultz returns today for followup. He has a h/o heart block, s/p PPM insertion, who returns for ongoing eval. 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 He has not had syncope. No chest pain or sob.  No Known Allergies   Current Outpatient Medications  Medication Sig Dispense Refill  . aspirin EC 81 MG tablet Take 81 mg by mouth daily.    . carboxymethylcellulose (REFRESH PLUS) 0.5 % SOLN Apply 1 drop to eye 3 (three) times daily as needed (for chronic dry eye).     . Cholecalciferol 2000 units CAPS Take 2,000 Units by mouth daily.    Marland Kitchen erythromycin ophthalmic ointment Place 1 application into both eyes at bedtime.     . folic acid (FOLVITE) 400 MCG tablet 400 mcg by Does not apply route daily.     Marland Kitchen HUMALOG 100 UNIT/ML injection 80-85 Units by Pump Prime route See admin instructions. Approximately 80-85 units/24hrs via insulin pump    . leflunomide (ARAVA) 10 MG tablet Take 10 mg by mouth daily.    . methotrexate (RHEUMATREX) 2.5 MG tablet Take 10 mg by mouth every Sunday.     . Multiple Vitamin (MULTIVITAMIN WITH MINERALS) TABS tablet Take 1 tablet by mouth every morning.    . Omega-3 Fatty Acids (FISH OIL) 1000 MG CAPS by Does not apply route daily.    Marland Kitchen omeprazole (PRILOSEC) 10 MG capsule Take 20 mg by mouth every morning.     . RESTASIS 0.05 % ophthalmic emulsion Place 1 drop into both eyes 2 (two) times daily.     . vitamin E 1000 UNIT capsule Take 1,000 Units by mouth daily.     No current facility-administered medications for this visit.     Past Medical History:  Diagnosis Date  . Apnea, sleep   . CKD (chronic kidney disease)   . Diabetes mellitus without complication (HCC)   . GERD (gastroesophageal reflux disease)   . Hyperkalemia   . Neuropathy   . Obstructive sleep apnea on CPAP   . RA (rheumatoid arthritis) (HCC)   . Renal disorder     ROS:   All systems reviewed and negative except as noted in the  HPI.   Past Surgical History:  Procedure Laterality Date  . ABDOMINAL AORTOGRAM N/A 02/25/2019   Procedure: ABDOMINAL AORTOGRAM;  Surgeon: Nada Libman, MD;  Location: [facility] INVASIVE CV LAB;  Service: Cardiovascular;  Laterality: N/A;  . ambutation toes    . AMPUTATION Left 09/10/2018   Procedure: AMPUTATION DIGIT LEFT 3RD TOE;  Surgeon: Erskine Emery, DPM;  Location: [facility] ORS;  Service: Podiatry;  Laterality: Left;  . CHOLECYSTECTOMY    . 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 LOWER EXTREMITY ANGIOGRAPHY Left 02/25/2019   Procedure: Lower Extremity Angiography;  Surgeon: Nada Libman, MD;  Location: [facility] INVASIVE CV LAB;  Service: Cardiovascular;  Laterality: Left;  . PACEMAKER IMPLANT N/A 05/23/2020   Procedure: PACEMAKER IMPLANT;  Surgeon: Marinus Maw, MD;  Location: [facility] INVASIVE CV LAB;  Service: Cardiovascular;  Laterality: N/A;     No family history on file.   Social History   Socioeconomic History  . Marital status: Married    Spouse name: Not on file  . Number of children: Not on file  . Years of education: Not on file  . Highest education level: Not on file  Occupational History  . Not on file  Tobacco  Use  . Smoking status: Never Smoker  . Smokeless tobacco: Never Used  Vaping Use  . Vaping Use: Never used  Substance and Sexual Activity  . Alcohol use: No  . Drug use: No  . Sexual activity: Not on file  Other Topics Concern  . Not on file  Social History Narrative  . Not on file   Social Determinants of Health   Financial Resource Strain:   . Difficulty of Paying Living Expenses: Not on file  Food Insecurity:   . Worried About Programme researcher, broadcasting/film/video in the Last Year: Not on file  . Ran Out of Food in the Last Year: Not on file  Transportation Needs:   . Lack of Transportation (Medical): Not on file  . Lack of Transportation (Non-Medical): Not on file  Physical Activity:   . Days of Exercise per Week: Not on file  . Minutes of Exercise per Session:  Not on file  Stress:   . Feeling of Stress : Not on file  Social Connections:   . Frequency of Communication with Friends and Family: Not on file  . Frequency of Social Gatherings with Friends and Family: Not on file  . Attends Religious Services: Not on file  . Active Member of Clubs or Organizations: Not on file  . Attends Banker Meetings: Not on file  . Marital Status: Not on file  Intimate Partner Violence:   . Fear of Current or Ex-Partner: Not on file  . Emotionally Abused: Not on file  . Physically Abused: Not on file  . Sexually Abused: Not on file     BP 126/68   Pulse 70   Ht 5\' 9"  (1.753 m)   Wt 205 lb (93 kg)   SpO2 98%   BMI 30.27 kg/m   Physical Exam:  Well appearing 74 yo man, NAD HEENT: Unremarkable Neck:  No JVD, no thyromegally Lymphatics:  No adenopathy Back:  No CVA tenderness Lungs:  Clear with no wheezes HEART:  Regular rate rhythm, no murmurs, no rubs, no clicks Abd:  soft, positive bowel sounds, no organomegally, no rebound, no guarding Ext:  2 plus pulses, no edema, no cyanosis, no clubbing, right BKA Skin:  No rashes no nodules Neuro:  CN II through XII intact, motor grossly intact  DEVICE  Normal device function.  See PaceArt for details.   Assess/Plan: 1. CHB - he is conducting today. He is asymptomatic. 2. PPM - his medtronic DDD PM is working normally. He will f/u in a year. 3. PVD - He denies claudication, s/p right BKA.  73 Andrew Harner,MD

## 2020-10-10 ENCOUNTER — Encounter (INDEPENDENT_AMBULATORY_CARE_PROVIDER_SITE_OTHER): Payer: Self-pay | Admitting: Ophthalmology

## 2020-10-10 ENCOUNTER — Ambulatory Visit (INDEPENDENT_AMBULATORY_CARE_PROVIDER_SITE_OTHER): Payer: Medicare Other | Admitting: Ophthalmology

## 2020-10-10 ENCOUNTER — Other Ambulatory Visit: Payer: Self-pay

## 2020-10-10 DIAGNOSIS — H179 Unspecified corneal scar and opacity: Secondary | ICD-10-CM | POA: Insufficient documentation

## 2020-10-10 DIAGNOSIS — H35371 Puckering of macula, right eye: Secondary | ICD-10-CM | POA: Diagnosis not present

## 2020-10-10 DIAGNOSIS — Z794 Long term (current) use of insulin: Secondary | ICD-10-CM | POA: Insufficient documentation

## 2020-10-10 DIAGNOSIS — E113553 Type 2 diabetes mellitus with stable proliferative diabetic retinopathy, bilateral: Secondary | ICD-10-CM

## 2020-10-10 DIAGNOSIS — H3561 Retinal hemorrhage, right eye: Secondary | ICD-10-CM | POA: Diagnosis not present

## 2020-10-10 NOTE — Assessment & Plan Note (Signed)
Early central band keratopathy

## 2020-10-10 NOTE — Assessment & Plan Note (Signed)

## 2020-10-10 NOTE — Assessment & Plan Note (Signed)
The nature of macular pucker (epiretinal membrane ERM) was discussed with the patient as well as threshold criteria for vitrectomy surgery. I explained that in rare cases another surgery is needed to actually remove a second wrinkle should it regrow.  Most often, the epiretinal membrane and underlying wrinkled internal limiting membrane are removed with the first surgery, to accomplish the goals.   If the operative eye is Phakic (natural lens still present), cataract surgery is often recommended prior to Vitrectomy. This will enable the retina surgeon to have the best view during surgery and the patient to obtain optimal results in the future. Treatment options were discussed.  OD, minor epiretinal membrane stable over time

## 2020-10-10 NOTE — Assessment & Plan Note (Signed)
Inferior to visual axis, corneal opacity, thinning and peripheral neovascularization

## 2020-10-10 NOTE — Progress Notes (Signed)
10/10/2020     CHIEF COMPLAINT Patient presents for Retina Follow Up   HISTORY OF PRESENT ILLNESS: Andrew Schultz is a 74 y.o. male who presents to the clinic today for:   HPI    Retina Follow Up    Patient presents with  Diabetic Retinopathy.  In both eyes.  This started 9 months ago.  Severity is mild.  Duration of 9 months.  Since onset it is stable.          Comments    9 MO Diabetic F/U Stable vision, no new F/F  Last A1C: 8 taken 3 mo ago Last BS: 78 taken today         Last edited by Varney Biles D on 10/10/2020  1:03 PM. (History)      Referring physician: Benita Stabile, MD [address] [place],  [place] [address]  HISTORICAL INFORMATION:   Selected notes from the MEDICAL RECORD NUMBER    Lab Results  Component Value Date   HGBA1C 7.8 (H) 05/21/2020     CURRENT MEDICATIONS: Current Outpatient Medications (Ophthalmic Drugs)  Medication Sig  . carboxymethylcellulose (REFRESH PLUS) 0.5 % SOLN Apply 1 drop to eye 3 (three) times daily as needed (for chronic dry eye).   Marland Kitchen erythromycin ophthalmic ointment Place 1 application into both eyes at bedtime.   . RESTASIS 0.05 % ophthalmic emulsion Place 1 drop into both eyes 2 (two) times daily.    No current facility-administered medications for this visit. (Ophthalmic Drugs)   Current Outpatient Medications (Other)  Medication Sig  . aspirin EC 81 MG tablet Take 81 mg by mouth daily.  . Cholecalciferol 2000 units CAPS Take 2,000 Units by mouth daily.  . folic acid (FOLVITE) 400 MCG tablet 400 mcg by Does not apply route daily.   Marland Kitchen HUMALOG 100 UNIT/ML injection 80-85 Units by Pump Prime route See admin instructions. Approximately 80-85 units/24hrs via insulin pump  . leflunomide (ARAVA) 10 MG tablet Take 10 mg by mouth daily.  . methotrexate (RHEUMATREX) 2.5 MG tablet Take 10 mg by mouth every Sunday.   . Multiple Vitamin (MULTIVITAMIN WITH MINERALS) TABS tablet Take 1 tablet by mouth every  morning.  . Omega-3 Fatty Acids (FISH OIL) 1000 MG CAPS by Does not apply route daily.  Marland Kitchen omeprazole (PRILOSEC) 10 MG capsule Take 20 mg by mouth every morning.   . vitamin E 1000 UNIT capsule Take 1,000 Units by mouth daily.   No current facility-administered medications for this visit. (Other)      REVIEW OF SYSTEMS:    ALLERGIES No Known Allergies  PAST MEDICAL HISTORY Past Medical History:  Diagnosis Date  . Apnea, sleep   . CKD (chronic kidney disease)   . Diabetes mellitus without complication (HCC)   . GERD (gastroesophageal reflux disease)   . Hyperkalemia   . Neuropathy   . Obstructive sleep apnea on CPAP   . RA (rheumatoid arthritis) (HCC)   . Renal disorder    Past Surgical History:  Procedure Laterality Date  . ABDOMINAL AORTOGRAM N/A 02/25/2019   Procedure: ABDOMINAL AORTOGRAM;  Surgeon: Nada Libman, MD;  [facility]: [facility];  Service: Cardiovascular;  Laterality: N/A;  . ambutation toes    . AMPUTATION Left 09/10/2018   Procedure: AMPUTATION DIGIT LEFT 3RD TOE;  Surgeon: Erskine Emery, DPM;  Location: [facility];  Service: Podiatry;  Laterality: Left;  . CHOLECYSTECTOMY    . 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  LOWER EXTREMITY ANGIOGRAPHY Left 02/25/2019   Procedure: Lower Extremity Angiography;  Surgeon: Nada Libman, MD;  Location: [facility] INVASIVE CV LAB;  Service: Cardiovascular;  Laterality: Left;  . PACEMAKER IMPLANT N/A 05/23/2020   Procedure: PACEMAKER IMPLANT;  Surgeon: Marinus Maw, MD;  Location: [facility] INVASIVE CV LAB;  Service: Cardiovascular;  Laterality: N/A;    FAMILY HISTORY History reviewed. No pertinent family history.  SOCIAL HISTORY Social History   Tobacco Use  . Smoking status: Never Smoker  . Smokeless tobacco: Never Used  Vaping Use  . Vaping Use: Never used  Substance Use Topics  . Alcohol use: No  . Drug use: No         OPHTHALMIC EXAM:  Base Eye Exam    Visual Acuity (ETDRS)      Right Left   Dist  [place] 20/40 -1 20/400   Dist ph [place] NI 20/200 -1       Tonometry (Tonopen, 1:05 PM)      Right Left   Pressure 14 15       Pupils      Pupils Dark Light Shape React APD   Right PERRL 2 1 Round Minimal None   Left PERRL 2 1 Round Minimal None       Visual Fields (Counting fingers)      Left Right   Restrictions Total superior temporal, inferior temporal, superior nasal deficiencies Total superior temporal, superior nasal deficiencies       Extraocular Movement      Right Left    Full Full       Neuro/Psych    Oriented x3: Yes       Dilation    Both eyes: 1.0% Mydriacyl, 2.5% Phenylephrine @ 1:06 PM        Slit Lamp and Fundus Exam    External Exam      Right Left   External Normal Normal       Slit Lamp Exam      Right Left   Lids/Lashes Normal Normal   Conjunctiva/Sclera White and quiet White and quiet   Cornea Degeneration, inferior to the visual axis neovascularization, Opacity, Scar Early band keratopathy centrally   Anterior Chamber Deep and quiet Deep and quiet   Iris Round and reactive Round and reactive   Lens Centered posterior chamber intraocular lens Centered posterior chamber intraocular lens   Anterior Vitreous Normal Normal       Fundus Exam      Right Left   Posterior Vitreous Clear Clear   Disc 1+ Optic disc atrophy, 2+ Pallor 1+ Optic disc atrophy, 2+ Pallor   C/D Ratio 0.75 0.8   Macula Epiretinal membrane few microaneurysms, no clinically significant macular edema Focal laser scars,, no macular thickening   Vessels PDR-quiet PDR-quiet   Periphery Attached good peripheral PRP 360 Attached good peripheral PRP 360          IMAGING AND PROCEDURES  Imaging and Procedures for 10/10/20  OCT, Retina - OU - Both Eyes       Right Eye Quality was good. Scan locations included subfoveal. Central Foveal Thickness: 269. Progression has been stable. Findings include epiretinal membrane.   Left Eye Quality was good. Scan locations included  subfoveal. Central Foveal Thickness: 233. Progression has been stable. Findings include abnormal foveal contour.   Notes Mild epiretinal membrane OD, stable over time,  OS diffuse macular atrophy                ASSESSMENT/PLAN:  Controlled type 2 diabetes mellitus with stable proliferative retinopathy of both eyes, with long-term current use of insulin (HCC) The nature of regressed proliferative diabetic retinopathy was discussed with the patient. The patient was advised to maintain good glucose, blood pressure, monitor kidney function and serum lipid control as advised by personal physician. Rare risk for reactivation of progression exist with untreated severe anemia, untreated renal failure, untreated heart failure, and smoking. Complete avoidance of smoking was recommended. The chance of recurrent proliferative diabetic retinopathy was discussed as well as the chance of vitreous hemorrhage for which further treatments may be necessary.   Explained to the patient that the quiescent  proliferative diabetic retinopathy disease is unlikely to ever worsen.  Worsening factors would include however severe anemia, hypertension out-of-control or impending renal failure.  Right epiretinal membrane The nature of macular pucker (epiretinal membrane ERM) was discussed with the patient as well as threshold criteria for vitrectomy surgery. I explained that in rare cases another surgery is needed to actually remove a second wrinkle should it regrow.  Most often, the epiretinal membrane and underlying wrinkled internal limiting membrane are removed with the first surgery, to accomplish the goals.   If the operative eye is [place] (natural lens still present), cataract surgery is often recommended prior to Vitrectomy. This will enable the retina surgeon to have the best view during surgery and the patient to obtain optimal results in the future. Treatment options were discussed.  OD, minor epiretinal membrane  stable over time  Corneal scar, left eye Early central band keratopathy  Corneal scar, right eye Inferior to visual axis, corneal opacity, thinning and peripheral neovascularization      ICD-10-CM   1. Right epiretinal membrane  H35.371 OCT, Retina - OU - Both Eyes  2. Controlled type 2 diabetes mellitus with stable proliferative retinopathy of both eyes, with long-term current use of insulin (HCC)  E11.3553 OCT, Retina - OU - Both Eyes   Z79.4   3. Retinal hemorrhage of right eye  H35.61   4. 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 Corneal scar, right eye  H17.9     1.  Quiescent proliferative diabetic retinopathy each eye, stable over time will observe  2.  Minor epiretinal membrane OD, no distortion no progression observe  3.  Bilateral corneal scars, followed by Dr. Cresenciano Genre, [facility], follow-up as scheduled  Ophthalmic Meds Ordered this visit:  No orders of the defined types were placed in this encounter.      Return in about 9 months (around 07/10/2021) for DILATE OU, COLOR FP.  There are no Patient Instructions on file for this visit.   Explained the diagnoses, plan, and follow up with the patient and they expressed understanding.  Patient expressed understanding of the importance of proper follow up care.   Alford Highland Jonasia Coiner M.D. Diseases & Surgery of the Retina and Vitreous [facility] 10/10/20     Abbreviations: M myopia (nearsighted); A astigmatism; H hyperopia (farsighted); P presbyopia; Mrx spectacle prescription;  CTL contact lenses; OD right eye; OS left eye; OU both eyes  XT exotropia; ET esotropia; PEK punctate epithelial keratitis; PEE punctate epithelial erosions; DES dry eye syndrome; MGD meibomian gland dysfunction; ATs artificial tears; PFAT's preservative free artificial tears; NSC nuclear sclerotic cataract; PSC posterior subcapsular cataract; ERM epi-retinal membrane; PVD posterior vitreous detachment; RD retinal detachment; DM  diabetes mellitus; DR diabetic retinopathy; NPDR non-proliferative diabetic retinopathy; PDR proliferative diabetic retinopathy; CSME clinically significant macular  edema; DME diabetic macular edema; dbh dot blot hemorrhages; CWS cotton wool spot; POAG primary open angle glaucoma; C/D cup-to-disc ratio; HVF humphrey visual field; GVF goldmann visual field; OCT optical coherence tomography; IOP intraocular pressure; BRVO Branch retinal vein occlusion; CRVO central retinal vein occlusion; CRAO central retinal artery occlusion; BRAO branch retinal artery occlusion; RT retinal tear; SB scleral buckle; PPV pars plana vitrectomy; VH Vitreous hemorrhage; PRP panretinal laser photocoagulation; IVK intravitreal kenalog; VMT vitreomacular traction; MH Macular hole;  NVD neovascularization of the disc; NVE neovascularization elsewhere; AREDS age related eye disease study; ARMD age related macular degeneration; POAG primary open angle glaucoma; EBMD epithelial/anterior basement membrane dystrophy; ACIOL anterior chamber intraocular lens; IOL intraocular lens; PCIOL posterior chamber intraocular lens; Phaco/IOL phacoemulsification with intraocular lens placement; [place] photorefractive keratectomy; LASIK laser assisted in situ keratomileusis; HTN hypertension; DM diabetes mellitus; COPD chronic obstructive pulmonary disease

## 2020-11-28 ENCOUNTER — Ambulatory Visit (INDEPENDENT_AMBULATORY_CARE_PROVIDER_SITE_OTHER): Payer: Medicare Other

## 2020-11-28 DIAGNOSIS — I442 Atrioventricular block, complete: Secondary | ICD-10-CM | POA: Diagnosis not present

## 2020-11-29 LAB — CUP PACEART REMOTE DEVICE CHECK
Battery Remaining Longevity: 153 mo
Battery Voltage: 3.18 V
Brady Statistic AP VP Percent: 5.04 %
Brady Statistic AP VS Percent: 1.15 %
Brady Statistic AS VP Percent: 49 %
Brady Statistic AS VS Percent: 44.81 %
Brady Statistic RA Percent Paced: 7.06 %
Brady Statistic RV Percent Paced: 54.04 %
Date Time Interrogation Session: 20220102203301
Implantable Lead Implant Date: 20210628
Implantable Lead Implant Date: 20210628
Implantable Lead Location: 753859
Implantable Lead Location: 753860
Implantable Lead Model: 3830
Implantable Lead Model: 5076
Implantable Pulse Generator Implant Date: 20210628
Lead Channel Impedance Value: 323 Ohm
Lead Channel Impedance Value: 418 Ohm
Lead Channel Impedance Value: 551 Ohm
Lead Channel Impedance Value: 703 Ohm
Lead Channel Pacing Threshold Amplitude: 0.875 V
Lead Channel Pacing Threshold Amplitude: 1 V
Lead Channel Pacing Threshold Pulse Width: 0.4 ms
Lead Channel Pacing Threshold Pulse Width: 0.4 ms
Lead Channel Sensing Intrinsic Amplitude: 3 mV
Lead Channel Sensing Intrinsic Amplitude: 3 mV
Lead Channel Sensing Intrinsic Amplitude: 8 mV
Lead Channel Sensing Intrinsic Amplitude: 8 mV
Lead Channel Setting Pacing Amplitude: 2 V
Lead Channel Setting Pacing Amplitude: 2.5 V
Lead Channel Setting Pacing Pulse Width: 0.4 ms
Lead Channel Setting Sensing Sensitivity: 0.9 mV

## 2020-12-13 NOTE — Progress Notes (Signed)
Remote pacemaker transmission.   

## 2021-02-24 ENCOUNTER — Ambulatory Visit (INDEPENDENT_AMBULATORY_CARE_PROVIDER_SITE_OTHER): Payer: Medicare Other

## 2021-02-24 DIAGNOSIS — I442 Atrioventricular block, complete: Secondary | ICD-10-CM | POA: Diagnosis not present

## 2021-02-24 LAB — CUP PACEART REMOTE DEVICE CHECK
Battery Remaining Longevity: 149 mo
Battery Voltage: 3.13 V
Brady Statistic AP VP Percent: 17.26 %
Brady Statistic AP VS Percent: 2.07 %
Brady Statistic AS VP Percent: 40.02 %
Brady Statistic AS VS Percent: 40.65 %
Brady Statistic RA Percent Paced: 25.48 %
Brady Statistic RV Percent Paced: 57.28 %
Date Time Interrogation Session: 20220401015130
Implantable Lead Implant Date: 20210628
Implantable Lead Implant Date: 20210628
Implantable Lead Location: 753859
Implantable Lead Location: 753860
Implantable Lead Model: 3830
Implantable Lead Model: 5076
Implantable Pulse Generator Implant Date: 20210628
Lead Channel Impedance Value: 323 Ohm
Lead Channel Impedance Value: 399 Ohm
Lead Channel Impedance Value: 532 Ohm
Lead Channel Impedance Value: 608 Ohm
Lead Channel Pacing Threshold Amplitude: 0.875 V
Lead Channel Pacing Threshold Amplitude: 1 V
Lead Channel Pacing Threshold Pulse Width: 0.4 ms
Lead Channel Pacing Threshold Pulse Width: 0.4 ms
Lead Channel Sensing Intrinsic Amplitude: 3.75 mV
Lead Channel Sensing Intrinsic Amplitude: 3.75 mV
Lead Channel Sensing Intrinsic Amplitude: 8.5 mV
Lead Channel Sensing Intrinsic Amplitude: 8.5 mV
Lead Channel Setting Pacing Amplitude: 2 V
Lead Channel Setting Pacing Amplitude: 2.5 V
Lead Channel Setting Pacing Pulse Width: 0.4 ms
Lead Channel Setting Sensing Sensitivity: 0.9 mV

## 2021-03-08 NOTE — Progress Notes (Signed)
Remote pacemaker transmission.   

## 2021-04-21 ENCOUNTER — Encounter: Payer: Self-pay | Admitting: Internal Medicine

## 2021-05-04 ENCOUNTER — Ambulatory Visit (INDEPENDENT_AMBULATORY_CARE_PROVIDER_SITE_OTHER): Payer: Medicare Other | Admitting: Internal Medicine

## 2021-05-04 ENCOUNTER — Telehealth: Payer: Self-pay

## 2021-05-04 ENCOUNTER — Encounter: Payer: Self-pay | Admitting: Internal Medicine

## 2021-05-04 ENCOUNTER — Other Ambulatory Visit: Payer: Self-pay

## 2021-05-04 VITALS — BP 110/65 | HR 64 | Temp 96.9°F | Ht 67.0 in | Wt 206.0 lb

## 2021-05-04 DIAGNOSIS — K219 Gastro-esophageal reflux disease without esophagitis: Secondary | ICD-10-CM

## 2021-05-04 DIAGNOSIS — K2270 Barrett's esophagus without dysplasia: Secondary | ICD-10-CM

## 2021-05-04 NOTE — H&P (View-Only) (Signed)
Primary Care Physician:  Benita Stabile, MD Primary Gastroenterologist:  Dr. Marletta Lor  Chief Complaint  Patient presents with   Gastroesophageal Reflux    Had EGD done 2017 and was told needed another one in 3 years. Had episode last week of gerd during the night.   Colonoscopy    Reports was due last year    HPI:   Andrew Schultz is a 76 y.o. male who presents to clinic today by referral from his PCP Dr. Margo Aye for evaluation.  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  No dysplasia identified has chronic reflux which is controlled on omeprazole 20 mg daily.  He does have intermittent breakthrough symptoms primarily at night.  Notes heartburn when he lays down at times.  No dysphagia or odynophagia.  No melena hematochezia.  From a colon cancer screening standpoint he states he had a Cologuard in 2020 which was WNL.  Last colonoscopy 2011 which was WNL.  No history of adenomatous colon polyps.  No family history of colorectal malignancy.  No unintentional weight loss or abdominal pain.  Past Medical History:  Diagnosis Date   Apnea, sleep    CKD (chronic kidney disease)    Diabetes mellitus without complication (HCC)    GERD (gastroesophageal reflux disease)    Hyperkalemia    Neuropathy    Obstructive sleep apnea on CPAP    RA (rheumatoid arthritis) (HCC)    Renal disorder     Past Surgical History:  Procedure Laterality Date   ABDOMINAL AORTOGRAM N/A 02/25/2019   Procedure: ABDOMINAL AORTOGRAM;  Surgeon: Nada Libman, MD;  Location: [facility];  Service: Cardiovascular;  Laterality: N/A;   ambutation toes     AMPUTATION Left 09/10/2018   Procedure: AMPUTATION DIGIT LEFT 3RD TOE;  Surgeon: Erskine Emery, DPM;  Location: [facility];  Service: Podiatry;  Laterality: Left;   CHOLECYSTECTOMY     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     carboxymethylcellulose (REFRESH PLUS) 0.5 % SOLN Apply 1 drop to eye 3 (three) times daily as needed (for chronic dry eye).      Cholecalciferol 2000 units CAPS Take 2,000 Units by mouth daily.     folic acid (FOLVITE) 400 MCG tablet 400 mcg by Does not apply route daily.      HUMALOG 100 UNIT/ML injection 80-85 Units by Pump Prime route See admin instructions. Approximately 80-85 units/24hrs via insulin pump     leflunomide (ARAVA) 10 MG tablet Take 10 mg by mouth daily.     methotrexate (RHEUMATREX) 2.5 MG tablet Take 10 mg by mouth every Sunday.      Multiple Vitamin (MULTIVITAMIN WITH MINERALS) TABS tablet Take 1 tablet by mouth every morning.     Omega-3 Fatty Acids (FISH OIL) 1000 MG CAPS by Does not apply route daily.     omeprazole (PRILOSEC) 20 MG capsule Take 20 mg by mouth daily.  RESTASIS 0.05 % ophthalmic emulsion Place 1 drop into both eyes 2 (two) times daily.      vitamin E 1000 UNIT capsule Take 1,000 Units by mouth daily.     No current facility-administered medications for this visit.    Allergies as of 05/04/2021   (No Known Allergies)    No family history on file.  Social History   Socioeconomic History   Marital status: Married    Spouse name: Not on file   Number of children: Not on file   Years of education: Not on file   Highest education level: Not on file  Occupational History   Not on file  Tobacco Use   Smoking status: Never   Smokeless tobacco: Never  Vaping Use   Vaping Use: Never used  Substance and Sexual Activity    Alcohol use: No   Drug use: No   Sexual activity: Not on file  Other Topics Concern   Not on file  Social History Narrative   Not on file   Social Determinants of Health   Financial Resource Strain: Not on file  Food Insecurity: Not on file  Transportation Needs: Not on file  Physical Activity: Not on file  Stress: Not on file  Social Connections: Not on file  Intimate Partner Violence: Not on file    Subjective: Review of Systems  Constitutional:  Negative for chills and fever.  HENT:  Negative for congestion and hearing loss.   Eyes:  Negative for blurred vision and double vision.  Respiratory:  Negative for cough and shortness of breath.   Cardiovascular:  Negative for chest pain and palpitations.  Gastrointestinal:  Positive for heartburn. Negative for abdominal pain, blood in stool, constipation, diarrhea, melena and vomiting.  Genitourinary:  Negative for dysuria and urgency.  Musculoskeletal:  Negative for joint pain and myalgias.  Skin:  Negative for itching and rash.  Neurological:  Negative for dizziness and headaches.  Psychiatric/Behavioral:  Negative for depression. The patient is not nervous/anxious.       Objective: BP 110/65   Pulse 64   Temp (!) 96.9 F (36.1 C)   Ht 5\' 7"  (1.702 m)   Wt 206 lb (93.4 kg)   BMI 32.26 kg/m  Physical Exam Constitutional:      Appearance: Normal appearance.  HENT:     Head: Normocephalic and atraumatic.  Eyes:     Extraocular Movements: Extraocular movements intact.     Conjunctiva/sclera: Conjunctivae normal.  Cardiovascular:     Rate and Rhythm: Normal rate and regular rhythm.  Pulmonary:     Effort: Pulmonary effort is normal.     Breath sounds: Normal breath sounds.  Abdominal:     General: Bowel sounds are normal.     Palpations: Abdomen is soft.  Musculoskeletal:        General: Normal range of motion.     Cervical back: Normal range of motion and neck supple.  Skin:    General: Skin is warm.   Neurological:     General: No focal deficit present.     Mental Status: He is alert and oriented to person, place, and time.  Psychiatric:        Mood and Affect: Mood normal.        Behavior: Behavior normal.     Assessment: *Barrett's esophagus *Chronic GERD-relatively well controlled on omeprazole 20 mg daily  Plan: Discussed Barrett's esophagus in depth with patient today.  Biopsies consistent with intestinal metaplasia without dysplasia in  2017.  We will schedule patient for surveillance EGD today and rebiopsy his Barrett's segment.  At the same time, I will evaluate for peptic ulcer disease, esophagitis, gastritis, H. Pylori, duodenitis, or other. Will also evaluate for esophageal stricture, Schatzki's ring, esophageal web or other.   The risks including infection, bleed, or perforation as well as benefits, limitations, alternatives and imponderables have been reviewed with the patient. Potential for esophageal dilation, biopsy, etc. have also been reviewed.  Questions have been answered. All parties agreeable.  Continue on omeprazole 20 mg daily.  Patient moving to [place] in a few months.  Recommended he discuss with his future physician in 2025 the risks versus benefits of further colon cancer screening.  Thank you Dr. Margo Aye for the kind referral.  05/04/2021 9:10 AM   Disclaimer: This note was dictated with voice recognition software. Similar sounding words can inadvertently be transcribed and may not be corrected upon review.

## 2021-05-04 NOTE — Progress Notes (Signed)
Primary Care Physician:  Benita Stabile, MD Primary Gastroenterologist:  Dr. Marletta Lor  Chief Complaint  Patient presents with   Gastroesophageal Reflux    Had EGD done 2017 and was told needed another one in 3 years. Had episode last week of gerd during the night.   Colonoscopy    Reports was due last year    HPI:   Andrew Schultz is a 76 y.o. male who presents to clinic today by referral from his PCP Dr. Margo Aye for evaluation.  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  No dysplasia identified has chronic reflux which is controlled on omeprazole 20 mg daily.  He does have intermittent breakthrough symptoms primarily at night.  Notes heartburn when he lays down at times.  No dysphagia or odynophagia.  No melena hematochezia.  From a colon cancer screening standpoint he states he had a Cologuard in 2020 which was WNL.  Last colonoscopy 2011 which was WNL.  No history of adenomatous colon polyps.  No family history of colorectal malignancy.  No unintentional weight loss or abdominal pain.  Past Medical History:  Diagnosis Date   Apnea, sleep    CKD (chronic kidney disease)    Diabetes mellitus without complication (HCC)    GERD (gastroesophageal reflux disease)    Hyperkalemia    Neuropathy    Obstructive sleep apnea on CPAP    RA (rheumatoid arthritis) (HCC)    Renal disorder     Past Surgical History:  Procedure Laterality Date   ABDOMINAL AORTOGRAM N/A 02/25/2019   Procedure: ABDOMINAL AORTOGRAM;  Surgeon: Nada Libman, MD;  Location: [facility];  Service: Cardiovascular;  Laterality: N/A;   ambutation toes     AMPUTATION Left 09/10/2018   Procedure: AMPUTATION DIGIT LEFT 3RD TOE;  Surgeon: Erskine Emery, DPM;  Location: [facility];  Service: Podiatry;  Laterality: Left;   CHOLECYSTECTOMY     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     carboxymethylcellulose (REFRESH PLUS) 0.5 % SOLN Apply 1 drop to eye 3 (three) times daily as needed (for chronic dry eye).      Cholecalciferol 2000 units CAPS Take 2,000 Units by mouth daily.     folic acid (FOLVITE) 400 MCG tablet 400 mcg by Does not apply route daily.      HUMALOG 100 UNIT/ML injection 80-85 Units by Pump Prime route See admin instructions. Approximately 80-85 units/24hrs via insulin pump     leflunomide (ARAVA) 10 MG tablet Take 10 mg by mouth daily.     methotrexate (RHEUMATREX) 2.5 MG tablet Take 10 mg by mouth every Sunday.      Multiple Vitamin (MULTIVITAMIN WITH MINERALS) TABS tablet Take 1 tablet by mouth every morning.     Omega-3 Fatty Acids (FISH OIL) 1000 MG CAPS by Does not apply route daily.     omeprazole (PRILOSEC) 20 MG capsule Take 20 mg by mouth daily.  RESTASIS 0.05 % ophthalmic emulsion Place 1 drop into both eyes 2 (two) times daily.      vitamin E 1000 UNIT capsule Take 1,000 Units by mouth daily.     No current facility-administered medications for this visit.    Allergies as of 05/04/2021   (No Known Allergies)    No family history on file.  Social History   Socioeconomic History   Marital status: Married    Spouse name: Not on file   Number of children: Not on file   Years of education: Not on file   Highest education level: Not on file  Occupational History   Not on file  Tobacco Use   Smoking status: Never   Smokeless tobacco: Never  Vaping Use   Vaping Use: Never used  Substance and Sexual Activity    Alcohol use: No   Drug use: No   Sexual activity: Not on file  Other Topics Concern   Not on file  Social History Narrative   Not on file   Social Determinants of Health   Financial Resource Strain: Not on file  Food Insecurity: Not on file  Transportation Needs: Not on file  Physical Activity: Not on file  Stress: Not on file  Social Connections: Not on file  Intimate Partner Violence: Not on file    Subjective: Review of Systems  Constitutional:  Negative for chills and fever.  HENT:  Negative for congestion and hearing loss.   Eyes:  Negative for blurred vision and double vision.  Respiratory:  Negative for cough and shortness of breath.   Cardiovascular:  Negative for chest pain and palpitations.  Gastrointestinal:  Positive for heartburn. Negative for abdominal pain, blood in stool, constipation, diarrhea, melena and vomiting.  Genitourinary:  Negative for dysuria and urgency.  Musculoskeletal:  Negative for joint pain and myalgias.  Skin:  Negative for itching and rash.  Neurological:  Negative for dizziness and headaches.  Psychiatric/Behavioral:  Negative for depression. The patient is not nervous/anxious.       Objective: BP 110/65   Pulse 64   Temp (!) 96.9 F (36.1 C)   Ht 5\' 7"  (1.702 m)   Wt 206 lb (93.4 kg)   BMI 32.26 kg/m  Physical Exam Constitutional:      Appearance: Normal appearance.  HENT:     Head: Normocephalic and atraumatic.  Eyes:     Extraocular Movements: Extraocular movements intact.     Conjunctiva/sclera: Conjunctivae normal.  Cardiovascular:     Rate and Rhythm: Normal rate and regular rhythm.  Pulmonary:     Effort: Pulmonary effort is normal.     Breath sounds: Normal breath sounds.  Abdominal:     General: Bowel sounds are normal.     Palpations: Abdomen is soft.  Musculoskeletal:        General: Normal range of motion.     Cervical back: Normal range of motion and neck supple.  Skin:    General: Skin is warm.   Neurological:     General: No focal deficit present.     Mental Status: He is alert and oriented to person, place, and time.  Psychiatric:        Mood and Affect: Mood normal.        Behavior: Behavior normal.     Assessment: *Barrett's esophagus *Chronic GERD-relatively well controlled on omeprazole 20 mg daily  Plan: Discussed Barrett's esophagus in depth with patient today.  Biopsies consistent with intestinal metaplasia without dysplasia in  2017.  We will schedule patient for surveillance EGD today and rebiopsy his Barrett's segment.  At the same time, I will evaluate for peptic ulcer disease, esophagitis, gastritis, H. Pylori, duodenitis, or other. Will also evaluate for esophageal stricture, Schatzki's ring, esophageal web or other.   The risks including infection, bleed, or perforation as well as benefits, limitations, alternatives and imponderables have been reviewed with the patient. Potential for esophageal dilation, biopsy, etc. have also been reviewed.  Questions have been answered. All parties agreeable.  Continue on omeprazole 20 mg daily.  Patient moving to [place] in a few months.  Recommended he discuss with his future physician in 2025 the risks versus benefits of further colon cancer screening.  Thank you Dr. Hall for the kind referral.  05/04/2021 9:10 AM   Disclaimer: This note was dictated with voice recognition software. Similar sounding words can inadvertently be transcribed and may not be corrected upon review.  

## 2021-05-04 NOTE — Telephone Encounter (Signed)
PA for EGD submitted via [facility] website. PA# [id number], valid 05/30/21-08/28/21.

## 2021-05-04 NOTE — Patient Instructions (Signed)
We will schedule you for upper endoscopy for surveillance purposes in regards to your Barrett's esophagus.  We may need to increase your omeprazole to twice daily pending endoscopic findings.  From a colon cancer screening standpoint, you are good until 2025 at which time you can discuss with your new physician whether further colon cancer screening is indicated or not.  Further recommendations to follow.  At [facility] we value your feedback. You may receive a survey about your visit today. Please share your experience as we strive to create trusting relationships with our patients to provide genuine, compassionate, quality care.  We appreciate your understanding and patience as we review any laboratory studies, imaging, and other diagnostic tests that are ordered as we care for you. Our office policy is 5 business days for review of these results, and any emergent or urgent results are addressed in a timely manner for your best interest. If you do not hear from our office in 1 week, please contact us.   We also encourage the use of MyChart, which contains your medical information for your review as well. If you are not enrolled in this feature, an access code is on this after visit summary for your convenience. Thank you for allowing [place] to be involved in your care.  It was great to see you today!  I hope you have a great rest of your summer!!    Hennie Duos. Marletta Lor, D.O. Gastroenterology and Hepatology [facility]

## 2021-05-24 NOTE — Patient Instructions (Addendum)
Andrew Schultz  05/24/2021     @PREFPERIOPPHARMACY @   Your procedure is scheduled on 05/30/2021.  Report to [facility] at 9:00 A.M.  Call this number if you have problems the morning of surgery:  [phone]   Remember:  Do not eat or drink after midnight.   Please follow the diet instructions given to you by your doctor's office.    Take these medicines the morning of surgery with A SIP OF WATER : Prilosec and Arava    Please adjust your insulin pump down to your basal rate at midnight the night before your procedure.    Do not wear jewelry, make-up or nail polish.  Do not wear lotions, powders, or perfumes, or deodorant.  Do not shave 48 hours prior to surgery.  Men may shave face and neck.  Do not bring valuables to the hospital.  [facility] is not responsible for any belongings or valuables.  Contacts, dentures or bridgework may not be worn into surgery.  Leave your suitcase in the car.  After surgery it may be brought to your room.  For patients admitted to the hospital, discharge time will be determined by your treatment team.  Patients discharged the day of surgery will not be allowed to drive home.   Name and phone number of your driver:   family Special instructions:  n/a  Please read over the following fact sheets that you were given.   Care and Recovery After Surgery  Upper Endoscopy, Adult Upper endoscopy is a procedure to look inside the upper GI (gastrointestinal) tract. The upper GI tract is made up of: The part of the body that moves food from your mouth to your stomach (esophagus). The stomach. The first part of your small intestine (duodenum). This procedure is also called esophagogastroduodenoscopy (EGD) or gastroscopy. In this procedure, your health care provider passes a thin, flexible tube (endoscope) through your mouth and down your esophagus into your stomach. A small camera is attached to the end of the tube. Images from the camera appear on  a monitor in the exam room. During this procedure, your health care provider may also remove a small piece of tissue to be sent to a lab and examined under a microscope (biopsy). Your health care provider may do an upper endoscopy to diagnose cancers of the upper GI tract. You may also have this procedure to find the cause of other conditions, such as: Stomach pain. Heartburn. Pain or problems when swallowing. Nausea and vomiting. Stomach bleeding. Stomach ulcers. Tell a health care provider about: Any allergies you have. All medicines you are taking, including vitamins, herbs, eye drops, creams, and over-the-counter medicines. Any problems you or family members have had with anesthetic medicines. Any blood disorders you have. Any surgeries you have had. Any medical conditions you have. Whether you are pregnant or may be pregnant. What are the risks? Generally, this is a safe procedure. However, problems may occur, including: Infection. Bleeding. Allergic reactions to medicines. A tear or hole (perforation) in the esophagus, stomach, or duodenum. What happens before the procedure? Staying hydrated Follow instructions from your health care provider about hydration, which may include: Up to 2 hours before the procedure - you may continue to drink clear liquids, such as water, clear fruit juice, black coffee, and plain tea.  Eating and drinking restrictions Follow instructions from your health care provider about eating and drinking, which may include: 8 hours before the procedure - stop eating heavy meals or  foods, such as meat, fried foods, or fatty foods. 6 hours before the procedure - stop eating light meals or foods, such as toast or cereal. 6 hours before the procedure - stop drinking milk or drinks that contain milk. 2 hours before the procedure - stop drinking clear liquids. Medicines Ask your health care provider about: Changing or stopping your regular medicines. This is  especially important if you are taking diabetes medicines or blood thinners. Taking medicines such as aspirin and ibuprofen. These medicines can thin your blood. Do not take these medicines unless your health care provider tells you to take them. Taking over-the-counter medicines, vitamins, herbs, and supplements. General instructions Plan to have someone take you home from the hospital or clinic. If you will be going home right after the procedure, plan to have someone with you for 24 hours. Ask your health care provider what steps will be taken to help prevent infection. What happens during the procedure?  An IV will be inserted into one of your veins. You may be given one or more of the following: A medicine to help you relax (sedative). A medicine to numb the throat (local anesthetic). You will lie on your left side on an exam table. Your health care provider will pass the endoscope through your mouth and down your esophagus. Your health care provider will use the scope to check the inside of your esophagus, stomach, and duodenum. Biopsies may be taken. The endoscope will be removed. The procedure may vary among health care providers and hospitals. What happens after the procedure? Your blood pressure, heart rate, breathing rate, and blood oxygen level will be monitored until you leave the hospital or clinic. Do not drive for 24 hours if you were given a sedative during your procedure. When your throat is no longer numb, you may be given some fluids to drink. It is up to you to get the results of your procedure. Ask your health care provider, or the department that is doing the procedure, when your results will be ready. Summary Upper endoscopy is a procedure to look inside the upper GI tract. During the procedure, an IV will be inserted into one of your veins. You may be given a medicine to help you relax. A medicine will be used to numb your throat. The endoscope will be passed  through your mouth and down your esophagus. This information is not intended to replace advice given to you by your health care provider. Make sure you discuss any questions you have with your healthcare provider. Document Revised: 05/07/2018 Document Reviewed: 04/14/2018 Elsevier Patient Education  2022 [employer].

## 2021-05-25 ENCOUNTER — Other Ambulatory Visit: Payer: Self-pay

## 2021-05-25 ENCOUNTER — Encounter (HOSPITAL_COMMUNITY)
Admission: RE | Admit: 2021-05-25 | Discharge: 2021-05-25 | Disposition: A | Discharge status: Home / Self Care | Payer: Medicare Other | Source: Ambulatory Visit | Attending: Internal Medicine | Admitting: Internal Medicine

## 2021-05-25 ENCOUNTER — Encounter (HOSPITAL_COMMUNITY): Payer: Self-pay

## 2021-05-25 DIAGNOSIS — Z01818 Encounter for other preprocedural examination: Secondary | ICD-10-CM | POA: Insufficient documentation

## 2021-05-25 LAB — BASIC METABOLIC PANEL
Anion gap: 5 (ref 5–15)
BUN: 25 mg/dL — ABNORMAL HIGH (ref 8–23)
CO2: 26 mmol/L (ref 22–32)
Calcium: 8.8 mg/dL — ABNORMAL LOW (ref 8.9–10.3)
Chloride: 104 mmol/L (ref 98–111)
Creatinine, Ser: 1.24 mg/dL (ref 0.61–1.24)
GFR, Estimated: >60 mL/min (ref >60)
Glucose, Bld: 207 mg/dL — ABNORMAL HIGH (ref 70–99)
Potassium: 4.5 mmol/L (ref 3.5–5.1)
Sodium: 135 mmol/L (ref 135–145)

## 2021-05-26 ENCOUNTER — Ambulatory Visit (INDEPENDENT_AMBULATORY_CARE_PROVIDER_SITE_OTHER): Payer: Medicare Other

## 2021-05-26 DIAGNOSIS — I442 Atrioventricular block, complete: Secondary | ICD-10-CM

## 2021-05-29 LAB — CUP PACEART REMOTE DEVICE CHECK
Battery Remaining Longevity: 146 mo
Battery Voltage: 3.07 V
Brady Statistic AP VP Percent: 6.29 %
Brady Statistic AP VS Percent: 0.78 %
Brady Statistic AS VP Percent: 52.74 %
Brady Statistic AS VS Percent: 40.19 %
Brady Statistic RA Percent Paced: 7.05 %
Brady Statistic RV Percent Paced: 59.02 %
Date Time Interrogation Session: 20220630232521
Implantable Lead Implant Date: 20210628
Implantable Lead Implant Date: 20210628
Implantable Lead Location: 753859
Implantable Lead Location: 753860
Implantable Lead Model: 3830
Implantable Lead Model: 5076
Implantable Pulse Generator Implant Date: 20210628
Lead Channel Impedance Value: 323 Ohm
Lead Channel Impedance Value: 399 Ohm
Lead Channel Impedance Value: 532 Ohm
Lead Channel Impedance Value: 589 Ohm
Lead Channel Pacing Threshold Amplitude: 0.875 V
Lead Channel Pacing Threshold Amplitude: 0.875 V
Lead Channel Pacing Threshold Pulse Width: 0.4 ms
Lead Channel Pacing Threshold Pulse Width: 0.4 ms
Lead Channel Sensing Intrinsic Amplitude: 3.875 mV
Lead Channel Sensing Intrinsic Amplitude: 3.875 mV
Lead Channel Sensing Intrinsic Amplitude: 7.625 mV
Lead Channel Sensing Intrinsic Amplitude: 7.625 mV
Lead Channel Setting Pacing Amplitude: 1.75 V
Lead Channel Setting Pacing Amplitude: 2.5 V
Lead Channel Setting Pacing Pulse Width: 0.4 ms
Lead Channel Setting Sensing Sensitivity: 0.9 mV

## 2021-05-30 ENCOUNTER — Ambulatory Visit (HOSPITAL_COMMUNITY): Payer: Medicare Other | Admitting: Anesthesiology

## 2021-05-30 ENCOUNTER — Ambulatory Visit (HOSPITAL_COMMUNITY)
Admission: RE | Admit: 2021-05-30 | Discharge: 2021-05-30 | Disposition: A | Discharge status: Home / Self Care | Payer: Medicare Other | Attending: Internal Medicine | Admitting: Internal Medicine

## 2021-05-30 ENCOUNTER — Encounter (HOSPITAL_COMMUNITY)
Admission: RE | Disposition: A | Discharge status: Home / Self Care | Payer: Self-pay | Source: Home / Self Care | Attending: Internal Medicine

## 2021-05-30 ENCOUNTER — Other Ambulatory Visit: Payer: Self-pay

## 2021-05-30 ENCOUNTER — Encounter (HOSPITAL_COMMUNITY): Payer: Self-pay

## 2021-05-30 DIAGNOSIS — Z79899 Other long term (current) drug therapy: Secondary | ICD-10-CM | POA: Diagnosis not present

## 2021-05-30 DIAGNOSIS — Z794 Long term (current) use of insulin: Secondary | ICD-10-CM | POA: Diagnosis not present

## 2021-05-30 DIAGNOSIS — E1140 Type 2 diabetes mellitus with diabetic neuropathy, unspecified: Secondary | ICD-10-CM | POA: Diagnosis not present

## 2021-05-30 DIAGNOSIS — N189 Chronic kidney disease, unspecified: Secondary | ICD-10-CM | POA: Insufficient documentation

## 2021-05-30 DIAGNOSIS — K2270 Barrett's esophagus without dysplasia: Secondary | ICD-10-CM | POA: Diagnosis not present

## 2021-05-30 DIAGNOSIS — E1122 Type 2 diabetes mellitus with diabetic chronic kidney disease: Secondary | ICD-10-CM | POA: Insufficient documentation

## 2021-05-30 DIAGNOSIS — M069 Rheumatoid arthritis, unspecified: Secondary | ICD-10-CM | POA: Diagnosis not present

## 2021-05-30 DIAGNOSIS — K219 Gastro-esophageal reflux disease without esophagitis: Secondary | ICD-10-CM | POA: Diagnosis not present

## 2021-05-30 DIAGNOSIS — Z7982 Long term (current) use of aspirin: Secondary | ICD-10-CM | POA: Insufficient documentation

## 2021-05-30 DIAGNOSIS — K317 Polyp of stomach and duodenum: Secondary | ICD-10-CM | POA: Diagnosis not present

## 2021-05-30 HISTORY — PX: POLYPECTOMY: SHX5525

## 2021-05-30 HISTORY — PX: BIOPSY: SHX5522

## 2021-05-30 HISTORY — PX: ESOPHAGOGASTRODUODENOSCOPY (EGD) WITH PROPOFOL: SHX5813

## 2021-05-30 LAB — GLUCOSE, CAPILLARY
Glucose-Capillary: 153 mg/dL — ABNORMAL HIGH (ref 70–99)
Glucose-Capillary: 160 mg/dL — ABNORMAL HIGH (ref 70–99)

## 2021-05-30 SURGERY — ESOPHAGOGASTRODUODENOSCOPY (EGD) WITH PROPOFOL
Anesthesia: General

## 2021-05-30 MED ORDER — LACTATED RINGERS IV SOLN: INTRAVENOUS | Status: DC

## 2021-05-30 MED ORDER — EPHEDRINE SULFATE 50 MG/ML IJ SOLN
INTRAMUSCULAR | Status: DC | PRN
  Administered 2021-05-30 (×2): 10 mg via INTRAVENOUS

## 2021-05-30 MED ORDER — LIDOCAINE HCL (CARDIAC) PF 100 MG/5ML IV SOSY
PREFILLED_SYRINGE | INTRAVENOUS | Status: DC | PRN
  Administered 2021-05-30: 50 mg via INTRAVENOUS

## 2021-05-30 MED ORDER — PROPOFOL 10 MG/ML IV BOLUS
INTRAVENOUS | Status: DC | PRN
  Administered 2021-05-30: 100 mg via INTRAVENOUS
  Administered 2021-05-30: 30 mg via INTRAVENOUS

## 2021-05-30 NOTE — Discharge Instructions (Addendum)
EGD Discharge instructions Please read the instructions outlined below and refer to this sheet in the next few weeks. These discharge instructions provide you with general information on caring for yourself after you leave the hospital. Your doctor may also give you specific instructions. While your treatment has been planned according to the most current medical practices available, unavoidable complications occasionally occur. If you have any problems or questions after discharge, please call your doctor. ACTIVITY You may resume your regular activity but move at a slower pace for the next 24 hours.  Take frequent rest periods for the next 24 hours.  Walking will help expel (get rid of) the air and reduce the bloated feeling in your abdomen.  No driving for 24 hours (because of the anesthesia (medicine) used during the test).  You may shower.  Do not sign any important legal documents or operate any machinery for 24 hours (because of the anesthesia used during the test).  NUTRITION Drink plenty of fluids.  You may resume your normal diet.  Begin with a light meal and progress to your normal diet.  Avoid alcoholic beverages for 24 hours or as instructed by your caregiver.  MEDICATIONS You may resume your normal medications unless your caregiver tells you otherwise.  WHAT YOU CAN EXPECT TODAY You may experience abdominal discomfort such as a feeling of fullness or "gas" pains.  FOLLOW-UP Your doctor will discuss the results of your test with you.  SEEK IMMEDIATE MEDICAL ATTENTION IF ANY OF THE FOLLOWING OCCUR: Excessive nausea (feeling sick to your stomach) and/or vomiting.  Severe abdominal pain and distention (swelling).  Trouble swallowing.  Temperature over 101 F (37.8 C).  Rectal bleeding or vomiting of blood.    Your Barrett's esophagus appears stable.  Still technically short segment at only 2 cm.  I did take multiple biopsies of this.  You had a few polyps in your stomach which  I removed.  Await pathology results, my office will contact you.  Likely repeat EGD in 3 to 5 years.  Continue on omeprazole daily.  I hope you have a great rest of your week!  Andrew Schultz. Marletta Lor, D.O. Gastroenterology and Hepatology [facility]

## 2021-05-30 NOTE — Anesthesia Postprocedure Evaluation (Signed)
Anesthesia Post Note  Patient: Zai Chmiel  Procedure(s) Performed: ESOPHAGOGASTRODUODENOSCOPY (EGD) WITH PROPOFOL BIOPSY POLYPECTOMY  Patient location during evaluation: Phase II Anesthesia Type: General Level of consciousness: awake and alert and oriented Pain management: pain level controlled Vital Signs Assessment: post-procedure vital signs reviewed and stable Respiratory status: spontaneous breathing and respiratory function stable Cardiovascular status: blood pressure returned to baseline and stable Postop Assessment: no apparent nausea or vomiting Anesthetic complications: no   No notable events documented.   Last Vitals:  Vitals:   05/30/21 0937 05/30/21 1049  BP: 108/66 (!) 113/42  Pulse: 76 80  Resp: 19 16  Temp: 36.7 C (!) 36.1 C  SpO2: 96% 97%    Last Pain:  Vitals:   05/30/21 1049  TempSrc: Oral  PainSc: 0-No pain                 Sabree Nuon C Lynora Dymond

## 2021-05-30 NOTE — Op Note (Signed)
[facility] Patient Name: Andrew Schultz Procedure Date: 05/30/2021 10:22 AM MRN: [id number] Date of Birth: [birth year] Attending MD: Elon Alas. Edgar Frisk CSN: [id number] Age: 75 Admit Type: Outpatient Procedure:                Upper GI endoscopy Indications:              Gastro-esophageal reflux disease, Follow-up of                            Barrett's esophagus Providers:                Elon Alas. Abbey Chatters, DO, Charlsie Quest. Theda Sers RN, RN,                            Aram Candela Referring MD:              Medicines:                See the Anesthesia note for documentation of the                            administered medications Complications:            No immediate complications. Estimated Blood Loss:     Estimated blood loss was minimal. Procedure:                Pre-Anesthesia Assessment:                           - The anesthesia plan was to use monitored                            anesthesia care (MAC).                           After obtaining informed consent, the endoscope was                            passed under direct vision. Throughout the                            procedure, the patient's blood pressure, pulse, and                            oxygen saturations were monitored continuously. The                            [id number]-H190 ([id number]) scope was introduced through the                            mouth, and advanced to the second part of duodenum.                            The upper GI endoscopy was accomplished without                            difficulty. The patient tolerated the procedure  well. Scope In: 10:34:12 AM Scope Out: 10:42:20 AM Total Procedure Duration: 0 hours 8 minutes 8 seconds  Findings:      There were esophageal mucosal changes secondary to established       short-segment Barrett's disease present at the gastroesophageal       junction. The maximum longitudinal extent of these mucosal changes was 2       cm in  length. Biopsies were taken with a cold forceps for histology.      Two 7 to 10 mm sessile polyps with no bleeding and no stigmata of recent       bleeding were found in the gastric body. The polyp was removed with a       cold snare. Resection and retrieval were complete.      The duodenal bulb, first portion of the duodenum and second portion of       the duodenum were normal. Impression:               - Esophageal mucosal changes secondary to                            established short-segment Barrett's disease.                            Biopsied.                           - Two gastric polyps. Resected and retrieved.                           - Normal duodenal bulb, first portion of the                            duodenum and second portion of the duodenum. Moderate Sedation:      Per Anesthesia Care Recommendation:           - Patient has a contact number available for                            emergencies. The signs and symptoms of potential                            delayed complications were discussed with the                            patient. Return to normal activities tomorrow.                            Written discharge instructions were provided to the                            patient.                           - Resume previous diet.                           - Continue present medications.                           -  Await pathology results.                           - Repeat upper endoscopy in 3 years for                            surveillance.                           - Return to GI clinic PRN.                           - Continue on Omeprazole daily Procedure Code(s):        --- Professional ---                           [phone], Esophagogastroduodenoscopy, flexible,                            transoral; with removal of tumor(s), polyp(s), or                            other lesion(s) by snare technique                           [id number], 90,  Esophagogastroduodenoscopy, flexible,                            transoral; with biopsy, single or multiple Diagnosis Code(s):        --- Professional ---                           K22.70, Barrett's esophagus without dysplasia                           K31.7, Polyp of stomach and duodenum                           K21.9, Gastro-esophageal reflux disease without                            esophagitis CPT copyright 2019 American Medical Association. All rights reserved. The codes documented in this report are preliminary and upon coder review may  be revised to meet current compliance requirements. Elon Alas. Abbey Chatters, DO [place] Abbey Chatters, DO 05/30/2021 10:44:56 AM This report has been signed electronically. Number of Addenda: 0

## 2021-05-30 NOTE — Anesthesia Preprocedure Evaluation (Signed)
Anesthesia Evaluation  Patient identified by MRN, date of birth, ID band Patient awake    Reviewed: Allergy & Precautions, NPO status , Patient's Chart, lab work & pertinent test results  Airway Mallampati: II  TM Distance: >3 FB Neck ROM: Full    Dental  (+) Dental Advisory Given, Implants Crowns, bridge:   Pulmonary sleep apnea and Continuous Positive Airway Pressure Ventilation ,    Pulmonary exam normal breath sounds clear to auscultation       Cardiovascular Exercise Tolerance: Good Normal cardiovascular exam+ dysrhythmias + pacemaker  Rhythm:Regular Rate:Normal     Neuro/Psych negative neurological ROS  negative psych ROS   GI/Hepatic GERD  Medicated and Controlled,  Endo/Other  diabetes, Well Controlled, Type 2, Oral Hypoglycemic Agents  Renal/GU Renal InsufficiencyRenal disease     Musculoskeletal  (+) Arthritis , Rheumatoid disorders,    Abdominal   Peds  Hematology   Anesthesia Other Findings Right BKA  Reproductive/Obstetrics                             Anesthesia Physical Anesthesia Plan  ASA: 3  Anesthesia Plan: General   Post-op Pain Management:    Induction: Intravenous  PONV Risk Score and Plan: Propofol infusion  Airway Management Planned: Nasal Cannula and Natural Airway  Additional Equipment:   Intra-op Plan:   Post-operative Plan:   Informed Consent: I have reviewed the patients History and Physical, chart, labs and discussed the procedure including the risks, benefits and alternatives for the proposed anesthesia with the patient or authorized representative who has indicated his/her understanding and acceptance.     Dental advisory given  Plan Discussed with: CRNA and Surgeon  Anesthesia Plan Comments:         Anesthesia Quick Evaluation

## 2021-05-30 NOTE — Transfer of Care (Signed)
Immediate Anesthesia Transfer of Care Note  Patient: Andrew Schultz  Procedure(s) Performed: ESOPHAGOGASTRODUODENOSCOPY (EGD) WITH PROPOFOL BIOPSY POLYPECTOMY  Patient Location: Short Stay  Anesthesia Type:General  Level of Consciousness: drowsy  Airway & Oxygen Therapy: Patient Spontanous Breathing  Post-op Assessment: Report given to RN and Post -op Vital signs reviewed and stable  Post vital signs: Reviewed and stable  Last Vitals:  Vitals Value Taken Time  BP    Temp    Pulse    Resp    SpO2      Last Pain:  Vitals:   05/30/21 1030  TempSrc:   PainSc: 0-No pain         Complications: No notable events documented.

## 2021-05-30 NOTE — Interval H&P Note (Signed)
History and Physical Interval Note:  05/30/2021 10:23 AM  Andrew Schultz  has presented today for surgery, with the diagnosis of GERD, Barrett's esophagus.  The various methods of treatment have been discussed with the patient and family. After consideration of risks, benefits and other options for treatment, the patient has consented to  Procedure(s) with comments: ESOPHAGOGASTRODUODENOSCOPY (EGD) WITH PROPOFOL (N/A) - 11:15am as a surgical intervention.  The patient's history has been reviewed, patient examined, no change in status, stable for surgery.  I have reviewed the patient's chart and labs.  Questions were answered to the patient's satisfaction.     [name]

## 2021-05-31 LAB — SURGICAL PATHOLOGY

## 2021-06-07 ENCOUNTER — Encounter (HOSPITAL_COMMUNITY): Payer: Self-pay | Admitting: Internal Medicine

## 2021-06-15 NOTE — Progress Notes (Signed)
Remote pacemaker transmission.   

## 2021-06-28 ENCOUNTER — Telehealth: Payer: Self-pay | Admitting: Internal Medicine

## 2021-06-28 NOTE — Telephone Encounter (Signed)
Patient came by the office wanted to let Dr. Ladona Ridgel know he is moving on 07/18/21 to [place]. He wanted to know if he could refer/recommend an EP Dr there. He also wants to know if they will still be able to do the Home remote checks? He can be reached at [phone]

## 2021-07-02 NOTE — Telephone Encounter (Signed)
Unfortunately I do not know any EP MDs in that part of [place]. GT

## 2021-07-10 ENCOUNTER — Other Ambulatory Visit: Payer: Self-pay

## 2021-07-10 ENCOUNTER — Ambulatory Visit (INDEPENDENT_AMBULATORY_CARE_PROVIDER_SITE_OTHER): Payer: Medicare Other | Admitting: Ophthalmology

## 2021-07-10 ENCOUNTER — Encounter (INDEPENDENT_AMBULATORY_CARE_PROVIDER_SITE_OTHER): Payer: Self-pay | Admitting: Ophthalmology

## 2021-07-10 DIAGNOSIS — H179 Unspecified corneal scar and opacity: Secondary | ICD-10-CM | POA: Diagnosis not present

## 2021-07-10 DIAGNOSIS — E113553 Type 2 diabetes mellitus with stable proliferative diabetic retinopathy, bilateral: Secondary | ICD-10-CM | POA: Diagnosis not present

## 2021-07-10 DIAGNOSIS — Z794 Long term (current) use of insulin: Secondary | ICD-10-CM

## 2021-07-10 NOTE — Assessment & Plan Note (Signed)
Regressed PDR OU stable no active disease

## 2021-07-10 NOTE — Progress Notes (Signed)
07/10/2021     CHIEF COMPLAINT Patient presents for Retina Follow Up   HISTORY OF PRESENT ILLNESS: Andrew Schultz is a 75 y.o. male who presents to the clinic today for:   HPI     Retina Follow Up           Diagnosis: Other   Laterality: both eyes   Onset: 9 months ago   Severity: mild   Duration: 9 months   Course: stable         Comments   9 month fu OU and OCT/FP Pt states, "My vision has been ok until about 4 or 5 days ago and then my OD got very blurred. It feels like it has a film over it." Pt states that he is moving to [place] and he will need to have his records changed. A1C: 8.1 LBS: 136      Last edited by Kendra Opitz, COA on 07/10/2021  1:38 PM.      Referring physician: Celene Squibb, MD [place],   [id number]  HISTORICAL INFORMATION:   Selected notes from the MEDICAL RECORD NUMBER    Lab Results  Component Value Date   HGBA1C 7.8 (H) 05/21/2020     CURRENT MEDICATIONS: Current Outpatient Medications (Ophthalmic Drugs)  Medication Sig   carboxymethylcellulose (REFRESH PLUS) 0.5 % SOLN Apply 1 drop to eye 3 (three) times daily as needed (for chronic dry eye).    RESTASIS 0.05 % ophthalmic emulsion Place 1 drop into both eyes 2 (two) times daily.    No current facility-administered medications for this visit. (Ophthalmic Drugs)   Current Outpatient Medications (Other)  Medication Sig   aspirin EC 81 MG tablet Take 81 mg by mouth daily.   Cholecalciferol 2000 units CAPS Take 2,000 Units by mouth daily.   folic acid (FOLVITE) 528 MCG tablet Take 400 mcg by mouth daily.   HUMALOG 100 UNIT/ML injection 80-100 Units by Pump Prime route See admin instructions. Approximately 80-100 units/24hrs via insulin pump   leflunomide (ARAVA) 10 MG tablet Take 10 mg by mouth daily.   methotrexate (RHEUMATREX) 2.5 MG tablet Take 10 mg by mouth every Sunday.    Multiple Vitamin (MULTIVITAMIN WITH MINERALS) TABS tablet Take 1 tablet by  mouth every morning.   Omega-3 Fatty Acids (FISH OIL) 1000 MG CAPS Take 1,000 mg by mouth daily.   omeprazole (PRILOSEC) 20 MG capsule Take 20 mg by mouth daily.   vitamin C (ASCORBIC ACID) 500 MG tablet Take 500 mg by mouth daily.   vitamin E 1000 UNIT capsule Take 1,000 Units by mouth daily.   zinc gluconate 50 MG tablet Take 50 mg by mouth every other day.   No current facility-administered medications for this visit. (Other)      REVIEW OF SYSTEMS:    ALLERGIES No Active Allergies  PAST MEDICAL HISTORY Past Medical History:  Diagnosis Date   Apnea, sleep    CKD (chronic kidney disease)    Diabetes mellitus without complication (HCC)    GERD (gastroesophageal reflux disease)    Hyperkalemia    Neuropathy    Obstructive sleep apnea on CPAP    RA (rheumatoid arthritis) ([place])    Renal disorder    Past Surgical History:  Procedure Laterality Date   ABDOMINAL AORTOGRAM N/A 02/25/2019   Procedure: ABDOMINAL AORTOGRAM;  Surgeon: Serafina Mitchell, MD;  Location: [facility];  Service: Cardiovascular;  Laterality: N/A;   ambutation toes  AMPUTATION Left 09/10/2018   Procedure: AMPUTATION DIGIT LEFT 3RD TOE;  Surgeon: Tyson Babinski, DPM;  Location: [facility];  Service: Podiatry;  Laterality: Left;   BELOW KNEE LEG AMPUTATION Right    BIOPSY  05/30/2021   Procedure: BIOPSY;  Surgeon: Eloise Harman, DO;  Location: [facility];  Service: Endoscopy;;   CHOLECYSTECTOMY     ESOPHAGOGASTRODUODENOSCOPY (EGD) WITH PROPOFOL N/A 05/30/2021   Procedure: ESOPHAGOGASTRODUODENOSCOPY (EGD) WITH PROPOFOL;  Surgeon: Eloise Harman, DO;  [facility];  Service: Endoscopy;  Laterality: N/A;  11:15am   LEG AMPUTATION BELOW KNEE  03/2017   LOWER EXTREMITY ANGIOGRAPHY Left 02/25/2019   Procedure: Lower Extremity Angiography;  Surgeon: Serafina Mitchell, MD;  Location: [facility];  Service: Cardiovascular;  Laterality: Left;   PACEMAKER IMPLANT N/A 05/23/2020    Procedure: PACEMAKER IMPLANT;  Surgeon: Evans Lance, MD;  Location: [facility];  Service: Cardiovascular;  Laterality: N/A;   POLYPECTOMY  05/30/2021   Procedure: POLYPECTOMY;  Surgeon: Eloise Harman, DO;  [facility]: [facility];  Service: Endoscopy;;   toe amputation     several over the years   TONSILLECTOMY      FAMILY HISTORY History reviewed. No pertinent family history.  SOCIAL HISTORY Social History   Tobacco Use   Smoking status: Never   Smokeless tobacco: Never  Vaping Use   Vaping Use: Never used  Substance Use Topics   Alcohol use: No   Drug use: No         OPHTHALMIC EXAM:  Base Eye Exam     Visual Acuity (ETDRS)       Right Left   Dist [place] 20/80 20/400   Dist ph [place] 20/70 -2 NI         Tonometry (Tonopen, 1:41 PM)       Right Left   Pressure 10 12         Pupils       Pupils Dark Light Shape React APD   Right PERRL 2 1 Round Minimal None   Left PERRL 2 1 Round Minimal None         Visual Fields       Left Right   Restrictions Total superior temporal, inferior temporal, superior nasal deficiencies Total superior temporal, superior nasal deficiencies         Extraocular Movement       Right Left    Full Full         Neuro/Psych     Oriented x3: Yes         Dilation     Both eyes: 1.0% Mydriacyl, 2.5% Phenylephrine @ 1:41 PM           Slit Lamp and Fundus Exam     External Exam       Right Left   External Normal Normal         Slit Lamp Exam       Right Left   Lids/Lashes Normal Normal   Conjunctiva/Sclera White and quiet White and quiet   Cornea Degeneration, now into  the visual axis neovascularization, Opacity, Scar with some thinning, no epithelial defect Early band keratopathy centrally   Anterior Chamber Deep and quiet Deep and quiet   Iris Round and reactive Round and reactive   Lens Centered posterior chamber intraocular lens, Open posterior capsule Centered posterior chamber  intraocular lens, Open posterior capsule   Anterior Vitreous Normal Normal  Fundus Exam       Right Left   Posterior Vitreous Clear Clear   Disc 1+ Optic disc atrophy, 2+ Pallor 1+ Optic disc atrophy, 2+ Pallor   C/D Ratio 0.75 0.8   Macula Epiretinal membrane few microaneurysms, no clinically significant macular edema Focal laser scars,, no macular thickening   Vessels PDR-quiet PDR-quiet   Periphery Attached good peripheral PRP 360 Attached good peripheral PRP 360            IMAGING AND PROCEDURES  Imaging and Procedures for 07/10/21  OCT, Retina - OU - Both Eyes       Media opacity OD limits detailed view of the macula yet MAC-TEL appears to be with free of massive CSME  OS atrophic macular region     Color Fundus Photography Optos - OU - Both Eyes       Right Eye Progression has been stable. Disc findings include normal observations. Macula : microaneurysms.   Left Eye Progression has been stable. Disc findings include normal observations. Macula : microaneurysms.   Notes Bilateral quiescent PDR with clear media with the exception of cloudy cornea consistent with cloudy haze OD             ASSESSMENT/PLAN:  Corneal scar, right eye Progressive high risk according to patient's symptom with decline in vision to 20/40 now 20/80 appears to be related to the corneal opacity now into the visual axis.  Last visit with Dr. Annia Belt was May 2022.  I have encouraged patient to see Dr. Patrice Paradise this week particularly as he is moving next [place] out of state  Controlled type 2 diabetes mellitus with stable proliferative retinopathy of both eyes, with long-term current use of insulin ([place]) Regressed PDR OU stable no active disease     ICD-10-CM   1. Controlled type 2 diabetes mellitus with stable proliferative retinopathy of both eyes, with long-term current use of insulin (HCC)  E11.3553 OCT, Retina - OU - Both Eyes   Z79.4 Color Fundus Photography Optos  - OU - Both Eyes    2. Corneal scar, right eye  H17.9       1.  2.  3.  Ophthalmic Meds Ordered this visit:  No orders of the defined types were placed in this encounter.      Return if symptoms worsen or fail to improve,, patient moving to [place], for Call Dr. Lovey Newcomer Cohen's office evaluation this week.  There are no Patient Instructions on file for this visit.   Explained the diagnoses, plan, and follow up with the patient and they expressed understanding.  Patient expressed understanding of the importance of proper follow up care.   Clent Demark Vanellope Passmore M.D. Diseases & Surgery of the Retina and Vitreous Retina & Diabetic [place] 07/10/21     Abbreviations: M myopia (nearsighted); A astigmatism; H hyperopia (farsighted); P presbyopia; Mrx spectacle prescription;  CTL contact lenses; OD right eye; OS left eye; OU both eyes  XT exotropia; ET esotropia; PEK punctate epithelial keratitis; PEE punctate epithelial erosions; DES dry eye syndrome; MGD meibomian gland dysfunction; ATs artificial tears; PFAT's preservative free artificial tears; [place] nuclear sclerotic cataract; PSC posterior subcapsular cataract; ERM epi-retinal membrane; PVD posterior vitreous detachment; RD retinal detachment; DM diabetes mellitus; DR diabetic retinopathy; NPDR non-proliferative diabetic retinopathy; PDR proliferative diabetic retinopathy; CSME clinically significant macular edema; DME diabetic macular edema; dbh dot blot hemorrhages; CWS cotton wool spot; POAG primary open angle glaucoma; C/D cup-to-disc ratio; HVF humphrey visual field; GVF goldmann  visual field; OCT optical coherence tomography; IOP intraocular pressure; BRVO Branch retinal vein occlusion; CRVO central retinal vein occlusion; CRAO central retinal artery occlusion; BRAO branch retinal artery occlusion; RT retinal tear; SB scleral buckle; PPV pars plana vitrectomy; VH Vitreous hemorrhage; PRP panretinal laser photocoagulation; IVK  intravitreal kenalog; VMT vitreomacular traction; MH Macular hole;  NVD neovascularization of the disc; NVE neovascularization elsewhere; AREDS age related eye disease study; ARMD age related macular degeneration; POAG primary open angle glaucoma; EBMD epithelial/anterior basement membrane dystrophy; ACIOL anterior chamber intraocular lens; IOL intraocular lens; PCIOL posterior chamber intraocular lens; Phaco/IOL phacoemulsification with intraocular lens placement; [place] photorefractive keratectomy; LASIK laser assisted in situ keratomileusis; HTN hypertension; DM diabetes mellitus; COPD chronic obstructive pulmonary disease

## 2021-07-10 NOTE — Assessment & Plan Note (Signed)
Progressive high risk according to patient's symptom with decline in vision to 20/40 now 20/80 appears to be related to the corneal opacity now into the visual axis.  Last visit with Dr. Cresenciano Genre was May 2022.  I have encouraged patient to see Dr. Noel Gerold this week particularly as he is moving next week out of [place]

## 2021-08-21 ENCOUNTER — Telehealth: Payer: Self-pay

## 2021-08-21 NOTE — Telephone Encounter (Signed)
The patient transferred to [facility], [place].

## 2022-12-06 LAB — COLOGUARD: COLOGUARD: NEGATIVE

## 2024-04-30 ENCOUNTER — Encounter (INDEPENDENT_AMBULATORY_CARE_PROVIDER_SITE_OTHER): Payer: Self-pay | Admitting: *Deleted
